# Patient Record
Sex: Female | Born: 1970 | Race: Black or African American | Hispanic: No | Marital: Married | Smoking: Never smoker
Health system: Southern US, Community
[De-identification: ages and names within clinical notes are randomized; demographics above are authoritative.]

## PROBLEM LIST (undated history)

## (undated) DIAGNOSIS — K219 Gastro-esophageal reflux disease without esophagitis: Secondary | ICD-10-CM

## (undated) DIAGNOSIS — O24419 Gestational diabetes mellitus in pregnancy, unspecified control: Secondary | ICD-10-CM

## (undated) DIAGNOSIS — K589 Irritable bowel syndrome without diarrhea: Secondary | ICD-10-CM

## (undated) DIAGNOSIS — E669 Obesity, unspecified: Secondary | ICD-10-CM

## (undated) DIAGNOSIS — I10 Essential (primary) hypertension: Secondary | ICD-10-CM

## (undated) DIAGNOSIS — M3500 Sicca syndrome, unspecified: Secondary | ICD-10-CM

## (undated) DIAGNOSIS — J302 Other seasonal allergic rhinitis: Secondary | ICD-10-CM

## (undated) HISTORY — DX: Irritable bowel syndrome, unspecified: K58.9

## (undated) HISTORY — DX: Other seasonal allergic rhinitis: J30.2

## (undated) HISTORY — DX: Gestational diabetes mellitus in pregnancy, unspecified control: O24.419

## (undated) HISTORY — DX: Gastro-esophageal reflux disease without esophagitis: K21.9

## (undated) HISTORY — DX: Obesity, unspecified: E66.9

## (undated) HISTORY — DX: Essential (primary) hypertension: I10

## (undated) HISTORY — DX: Sjogren syndrome, unspecified: M35.00

---

## 1998-12-02 HISTORY — PX: CHOLECYSTECTOMY: SHX55

## 1998-12-02 HISTORY — PX: OTHER SURGICAL HISTORY: SHX169

## 2006-01-20 ENCOUNTER — Encounter: Admission: RE | Admit: 2006-01-20 | Discharge: 2006-01-20 | Payer: Self-pay | Admitting: Obstetrics and Gynecology

## 2006-03-18 ENCOUNTER — Inpatient Hospital Stay (HOSPITAL_COMMUNITY): Admission: AD | Admit: 2006-03-18 | Discharge: 2006-03-18 | Payer: Self-pay | Admitting: Obstetrics and Gynecology

## 2006-03-21 ENCOUNTER — Inpatient Hospital Stay (HOSPITAL_COMMUNITY): Admission: AD | Admit: 2006-03-21 | Discharge: 2006-03-21 | Payer: Self-pay | Admitting: Obstetrics and Gynecology

## 2006-03-24 ENCOUNTER — Encounter (INDEPENDENT_AMBULATORY_CARE_PROVIDER_SITE_OTHER): Payer: Self-pay | Admitting: Specialist

## 2006-03-24 ENCOUNTER — Ambulatory Visit: Payer: Self-pay | Admitting: Gynecology

## 2006-03-24 ENCOUNTER — Inpatient Hospital Stay (HOSPITAL_COMMUNITY): Admission: AD | Admit: 2006-03-24 | Discharge: 2006-03-27 | Payer: Self-pay | Admitting: *Deleted

## 2007-03-25 ENCOUNTER — Encounter: Admission: RE | Admit: 2007-03-25 | Discharge: 2007-03-25 | Payer: Self-pay | Admitting: Obstetrics and Gynecology

## 2009-06-22 ENCOUNTER — Ambulatory Visit: Payer: Self-pay | Admitting: Family Medicine

## 2009-06-22 DIAGNOSIS — J301 Allergic rhinitis due to pollen: Secondary | ICD-10-CM | POA: Insufficient documentation

## 2009-06-22 DIAGNOSIS — K589 Irritable bowel syndrome without diarrhea: Secondary | ICD-10-CM | POA: Insufficient documentation

## 2009-06-22 DIAGNOSIS — N3000 Acute cystitis without hematuria: Secondary | ICD-10-CM | POA: Insufficient documentation

## 2009-06-22 DIAGNOSIS — R079 Chest pain, unspecified: Secondary | ICD-10-CM | POA: Insufficient documentation

## 2009-06-22 LAB — CONVERTED CEMR LAB
Nitrite: NEGATIVE
Specific Gravity, Urine: 1.015
Urobilinogen, UA: 0.2
pH: 7

## 2009-06-23 ENCOUNTER — Encounter: Payer: Self-pay | Admitting: Family Medicine

## 2009-06-23 LAB — CONVERTED CEMR LAB
Bilirubin Urine: NEGATIVE
Hemoglobin, Urine: NEGATIVE
Protein, ur: NEGATIVE mg/dL
Urine Glucose: NEGATIVE mg/dL

## 2009-07-03 ENCOUNTER — Telehealth: Payer: Self-pay | Admitting: Family Medicine

## 2009-08-01 ENCOUNTER — Encounter (INDEPENDENT_AMBULATORY_CARE_PROVIDER_SITE_OTHER): Payer: Self-pay | Admitting: *Deleted

## 2009-08-01 ENCOUNTER — Ambulatory Visit: Payer: Self-pay | Admitting: Cardiology

## 2009-08-01 ENCOUNTER — Encounter: Payer: Self-pay | Admitting: Adult Health

## 2009-08-10 ENCOUNTER — Encounter: Payer: Self-pay | Admitting: Family Medicine

## 2009-08-10 LAB — CONVERTED CEMR LAB
Glucose, Urine, Semiquant: NEGATIVE
Protein, U semiquant: NEGATIVE
WBC Urine, dipstick: NEGATIVE

## 2009-08-11 ENCOUNTER — Encounter: Payer: Self-pay | Admitting: Family Medicine

## 2009-08-18 ENCOUNTER — Ambulatory Visit (HOSPITAL_COMMUNITY): Admission: RE | Admit: 2009-08-18 | Discharge: 2009-08-18 | Payer: Self-pay | Admitting: Cardiology

## 2009-08-18 ENCOUNTER — Ambulatory Visit: Payer: Self-pay | Admitting: Cardiology

## 2009-08-18 ENCOUNTER — Encounter: Payer: Self-pay | Admitting: Cardiology

## 2009-08-21 ENCOUNTER — Encounter (INDEPENDENT_AMBULATORY_CARE_PROVIDER_SITE_OTHER): Payer: Self-pay | Admitting: *Deleted

## 2010-01-03 ENCOUNTER — Ambulatory Visit: Payer: Self-pay | Admitting: Family Medicine

## 2010-01-03 DIAGNOSIS — R5383 Other fatigue: Secondary | ICD-10-CM | POA: Insufficient documentation

## 2010-01-03 DIAGNOSIS — J019 Acute sinusitis, unspecified: Secondary | ICD-10-CM

## 2010-04-27 ENCOUNTER — Encounter: Admission: RE | Admit: 2010-04-27 | Discharge: 2010-04-27 | Payer: Self-pay | Admitting: Obstetrics and Gynecology

## 2010-10-11 ENCOUNTER — Ambulatory Visit (HOSPITAL_COMMUNITY): Admission: RE | Admit: 2010-10-11 | Discharge: 2010-10-11 | Payer: Self-pay | Admitting: Urology

## 2010-12-06 ENCOUNTER — Ambulatory Visit: Admit: 2010-12-06 | Payer: Self-pay | Admitting: Internal Medicine

## 2011-01-01 NOTE — Assessment & Plan Note (Signed)
Summary: OV   Vital Signs:  Patient profile:   40 year old female Menstrual status:  regular Height:      60 inches Weight:      172 pounds BMI:     33.71 O2 Sat:      98 % Pulse rate:   76 / minute Pulse rhythm:   regular Resp:     16 per minute BP sitting:   110 / 70 Cuff size:   large  Vitals Entered By: Everitt Amber (January 03, 2010 3:17 PM)  Nutrition Counseling: Patient's BMI is greater than 25 and therefore counseled on weight management options. CC: sinus, headache, mucus is yellowish greenish with some blood off and on, going on for almost a month. Sometimes the mucus is clear but then it gets yellowish again   Primary Care Provider:  Dr.Margaret Lodema Hong  CC:  sinus, headache, mucus is yellowish greenish with some blood off and on, and going on for almost a month. Sometimes the mucus is clear but then it gets yellowish again.  History of Present Illness: one moonth h/o sinus pressure with post nsal drainage which is bloody at times.she has also had intermittent chills but no documented fever. She denies productive cough. The pt continues to exercise on a regular basis, and is somewhat surprised and disappointed that she has not lost weight. She has no interest in eith er a nutrition consult or a diet plan. Reports  thatshe has otherwise been doing well Denies recent fever   Denies chest congestion, or cough productive of sputum. Denies chest pain, palpitations, PND, orthopnea or leg swelling. Denies abdominal pain, nausea, vomitting, diarrhea or constipation. Denies change in bowel movements or bloody stool. Denies dysuria , frequency, or hesitancy. Denies  joint pain, swelling, or reduced mobility. Denies headaches, vertigo, seizures. Denies depression, anxiety or insomnia. Denies  rash, lesions, or itch.     Current Medications (verified): 1)  None  Allergies (verified): No Known Drug Allergies  Review of Systems      See HPI ENT:  Complains of nasal  congestion, postnasal drainage, and sinus pressure; 1 month h/o pressure over all sinuses, intermittent green drainage and bklloody nasal d/c, no feevr or chills.Marland Kitchen GI:  Complains of abdominal pain and constipation; IBS constipation predominant dx ove 5 yrs ago. GU:  Complains of incontinence; stress incontinence with excessive sneezing. Endo:  Denies cold intolerance, excessive hunger, excessive thirst, excessive urination, heat intolerance, polyuria, and weight change. Heme:  Denies abnormal bruising and bleeding. Allergy:  Complains of seasonal allergies.  Physical Exam  General:  Well-developed,well-nourished,in no acute distress; alert,appropriate and cooperative throughout examination HEENT: No facial asymmetry,  EOMI, positive  sinus tenderness, TM's Clear, oropharynx  pink and moist.   Chest: Clear to auscultation bilaterally.  CVS: S1, S2, No murmurs, No S3.   Abd: Soft, Nontender.  MS: Adequate ROM spine, hips, shoulders and knees.  Ext: No edema.   CNS: CN 2-12 intact, power tone and sensation normal throughout.   Skin: Intact, no visible lesions or rashes.  Psych: Good eye contact, normal affect.  Memory intact, not anxious or depressed appearing.    Impression & Recommendations:  Problem # 1:  ACUTE SINUSITIS, UNSPECIFIED (ICD-461.9) Assessment Comment Only  Her updated medication list for this problem includes:    Septra Ds 800-160 Mg Tabs (Sulfamethoxazole-trimethoprim) .Marland Kitchen... Take 1 tablet by mouth two times a day  Problem # 2:  IBS (ICD-564.1) Assessment: Unchanged  Problem # 3:  OBESITY, UNSPECIFIED (  ICD-278.00) Assessment: Unchanged  Ht: 60 (01/03/2010)   Wt: 172 (01/03/2010)   BMI: 33.71 (01/03/2010)  Complete Medication List: 1)  Septra Ds 800-160 Mg Tabs (Sulfamethoxazole-trimethoprim) .... Take 1 tablet by mouth two times a day 2)  Fluconazole 150 Mg Tabs (Fluconazole) .... Take 1 tablet by mouth once a day as needed  Other Orders: T-Basic Metabolic  Panel ([id number]) T-Lipid Profile [id number]) T-CBC w/Diff [phone]) T-TSH [phone])  Patient Instructions: 1)  F/UI as before. 2)  PLS get your mamo on your 39th b/day. 3)  You are being treated for acute sinustis. 4)  BMP prior to visit, ICD-9: 5)  Lipid Panel prior to visit, ICD-9:  fasting labs  past due pls do asap 6)  TSH prior to visit, ICD-9: 7)  CBC w/ Diff prior to visit, ICD-9: 8)  It is important that you exercise regularly at least 20 minutes 5 times a week. If you develop chest pain, have severe difficulty breathing, or feel very tired , stop exercising immediately and seek medical attention. 9)  You need to lose weight. Consider a lower calorie diet and regular exercise.  Prescriptions: FLUCONAZOLE 150 MG TABS (FLUCONAZOLE) Take 1 tablet by mouth once a day as needed  #3 x 0   Entered and Authorized by:   Syliva Overman MD   Signed by:   Syliva Overman MD on 01/03/2010   Method used:   Electronically to        [facility]* (retail)       [address]       [address] Shipping/mailing       [place], [place]  [address]       Ph: [phone]       Fax: [phone]   RxID:   [id number] SEPTRA DS 800-160 MG TABS (SULFAMETHOXAZOLE-TRIMETHOPRIM) Take 1 tablet by mouth two times a day  #42 x 0   Entered and Authorized by:   Syliva Overman MD   Signed by:   Syliva Overman MD on 01/03/2010   Method used:   Electronically to        [facility]* (retail)       [address]       [address]/mail[address]       [place], [place]  [address]       Ph: [phone]       Fax: [phone]   RxID:   [id number]

## 2011-04-18 ENCOUNTER — Other Ambulatory Visit: Payer: Self-pay | Admitting: Obstetrics and Gynecology

## 2011-04-18 DIAGNOSIS — Z1231 Encounter for screening mammogram for malignant neoplasm of breast: Secondary | ICD-10-CM

## 2011-04-19 NOTE — Op Note (Signed)
Brittney [name], Brittney [name]                ACCOUNT NO.:  [id number]   MEDICAL RECORD NO.:  [id number]          PATIENT TYPE:  INP   LOCATION:  9103                          FACILITY:  [facility]   PHYSICIAN:  Maxie Better, M.D.DATE OF BIRTH:  1971-02-15   DATE OF PROCEDURE:  03/24/2006  DATE OF DISCHARGE:                                 OPERATIVE REPORT   PREOPERATIVE DIAGNOSIS:  Nonreassuring fetal tracing, class A1 gestational  diabetes.  Desires sterilization.   PROCEDURE:  Primary cesarean section Sharl Ma hysterotomy, modified Pomeroy  tubal ligation.   POSTOPERATIVE DIAGNOSIS:  Nonreassuring fetal tracing, class A1 gestational  diabetes, desires sterilization.   ANESTHESIA:  Epidural.   SURGEON:  Maxie Better, M.D.   PROCEDURE:  Under adequate epidural anesthesia, the patient was placed in a  supine position with a left lateral tilt.  She was sterilely prepped and  draped in the usual fashion.  Indwelling Foley catheter was already in  place.  Internal scalp electrode was removed.  A Pfannenstiel skin incision  was made, carried down through the rectus fascia.  The rectus fascia was  incised in the midline, extending bilaterally.  The rectus fascia was then  bluntly and sharply dissected off the rectus muscle in a superior and  inferior fashion.  The rectus muscle was split in the midline.  The parietal  peritoneum was entered bluntly and extended.  The vesicouterine peritoneum  was opened transversely.  The bladder displaced inferiorly using a bladder  retractor.  A curvilinear low transverse uterine incision was then made and  extended bilaterally using bandage scissors.  Subsequent delivery of a live  female, left occiput anterior position, was accomplished.  The baby was bulb-  suctioned on the abdomen.  The cord was clamped, cut.  The baby was  transferred to the waiting pediatrician with Apgars of 8 and 9 at one and  five minutes.  The placenta, which was anterior, was  manually removed.  Cord  pH was obtained, 7.23.  Apgars of 8 and 9 were assigned at one and five  minutes.  Uterine incision was closed in two layers, the first layer with 0  Monocryl running locked stitch, the second layer was imbricated using 0  Monocryl suture.  Normal tubes and ovaries were noted bilaterally.   The mid portion of both fallopian tubes was grasped with a Babcock.  The  underlying mesosalpinx was opened using cautery.  The proximal and distal  portion of the tube was tied with a 0 chromic x2, proximally and distally,  and the intervening segments of both fallopian tubes were then removed.  The  abdomen was copiously irrigated and suctioned of debris.  The parietal  peritoneum and the vesicouterine peritoneum were now closed.  The rectus  fascia was closed with 0 Vicryl x2.  The subcutaneous areas were irrigated,  suctioned, small bleeders cauterized.  The subcutaneous areas were  approximated using 2-0 plain interrupted sutures.  The skin was approximated  using Ethicon staples.  The specimen was placenta.  A portion of the right  and left fallopian tubes were sent to pathology.  Estimated blood loss was  500 cc.  Intraoperative fluids were 900 cc.  Urine output was 150 cc of  concentrated urine.  Sponge and instrument counts x2 were correct.   COMPLICATIONS:  None.   The patient tolerated the procedure well and was transferred to recovery in  stable condition.      Maxie Better, M.D.  Electronically Signed     [place]/MEDQ  D:  03/24/2006  T:  03/25/2006  Job:  [id number]

## 2011-04-19 NOTE — Discharge Summary (Signed)
Brittney [name], Brittney [name]                ACCOUNT NO.:  [id number]   MEDICAL RECORD NO.:  [id number]          PATIENT TYPE:  INP   LOCATION:  9103                          FACILITY:  [facility]   PHYSICIAN:  Maxie Better, M.D.DATE OF BIRTH:  [birth year]   DATE OF ADMISSION:  03/24/2006  DATE OF DISCHARGE:  03/27/2006                                 DISCHARGE SUMMARY   ADMISSION DIAGNOSES:  1.  Term gestation.  2.  Class A1 gestational diabetes.  3.  Desires sterilization.  4.  Iron-deficiency anemia.  5.  Escherichia coli urinary tract infection.   DISCHARGE DIAGNOSES:  1.  Term gestation, delivered.  2.  Class A1 gestational diabetes.  3.  Desires sterilization.  4.  Nonreassuring fetal tracing.  5.  Iron-deficiency anemia.  6.  Escherichia coli urinary tract infection.   PROCEDURE:  Primary cesarean section, modified Pomeroy tubal ligation.   HISTORY OF PRESENT ILLNESS:  A 40 year old, G3, P1-0-1-1, female at 39+  weeks with class A1 gestational diabetes admitted for induction of labor due  to her diabetes. Group B strep was negative.   HOSPITAL COURSE:  The patient was admitted to the [facility], she was  started on low-dose pitocin. She was also continued on her antibiotics for  her urinary tract infection diagnosed prior to being admitted to the  hospital. The patient progressed during labor.  She was given epidural for  labor of pain management. PIH labs were obtained during labor due to  elevation of blood pressure and those were normal. The patient progressed to  full dilatation, 0+ station, and again having some deep variable  decelerations down to the 80s. An internal scalp electrode was placed.  Scalp stimulation was done.  Right occiput presentation on clinical exam  with a -1 station. The patient's baby continued to have fetal heart rate  deceleration with slow return to baseline, with no evidence of imminent  delivery and inability to perform an assisted vaginal  delivery. Decision was  made to proceed with a primary cesarean section. The patient was taken to  the operating room where she underwent a primary cesarean section. The  procedure resulted in the delivery of a live female, Apgars of 8 and 9. Cord  pH was obtained, which was 7.23. Postoperatively, the patient was continued  on IV antibiotics to cover her known urinary tract infection and she was  placed on a regular diet. Her CBC on postoperative day #1 showed a  hemoglobin of 10.8, hematocrit 31.8, white count of 12.0. Final pathology on  her placenta showed mild acute chorioamnionitis and evidence of transection  of both portions of the fallopian tubes. By postoperative day three the  patient was afebrile, incision had no evidence of infection. She was doing  well and was deemed well enough to be discharged home.   DISPOSITION:  Home.   CONDITION ON DISCHARGE:  Stable.   DISCHARGE MEDICATIONS:  1.  Motrin 800 mg one p.o. q.6-8h. p.r.n. pain.  2.  Keflex 500 mg one p.o. q.6h.  3.  Prenatal vitamins one p.o. daily.  4.  Tylox  one to two tablets q.3-4h. p.r.n. pain.   DISCHARGE INSTRUCTIONS:  Per the postpartum booklet given. Follow up at  [facility] in six weeks.      Maxie Better, M.D.  Electronically Signed     [place]/MEDQ  D:  05/05/2006  T:  05/06/2006  Job:  [id number]

## 2011-05-09 ENCOUNTER — Ambulatory Visit: Payer: Self-pay

## 2011-06-06 ENCOUNTER — Ambulatory Visit (HOSPITAL_COMMUNITY)
Admission: RE | Admit: 2011-06-06 | Discharge: 2011-06-06 | Disposition: A | Discharge status: Home / Self Care | Payer: 59 | Source: Ambulatory Visit | Attending: Obstetrics and Gynecology | Admitting: Obstetrics and Gynecology

## 2011-06-06 DIAGNOSIS — Z1231 Encounter for screening mammogram for malignant neoplasm of breast: Secondary | ICD-10-CM | POA: Insufficient documentation

## 2011-06-11 ENCOUNTER — Encounter: Payer: Self-pay | Admitting: Adult Health

## 2012-01-23 ENCOUNTER — Other Ambulatory Visit (HOSPITAL_COMMUNITY): Payer: Self-pay | Admitting: Obstetrics and Gynecology

## 2012-01-23 DIAGNOSIS — N644 Mastodynia: Secondary | ICD-10-CM

## 2012-02-03 ENCOUNTER — Other Ambulatory Visit: Payer: Self-pay | Admitting: Obstetrics and Gynecology

## 2012-02-03 ENCOUNTER — Other Ambulatory Visit (HOSPITAL_COMMUNITY): Payer: Self-pay | Admitting: Obstetrics and Gynecology

## 2012-02-04 ENCOUNTER — Ambulatory Visit (HOSPITAL_COMMUNITY)
Admission: RE | Admit: 2012-02-04 | Discharge: 2012-02-04 | Disposition: A | Discharge status: Home / Self Care | Payer: 59 | Source: Ambulatory Visit | Attending: Obstetrics and Gynecology | Admitting: Obstetrics and Gynecology

## 2012-02-04 ENCOUNTER — Other Ambulatory Visit (HOSPITAL_COMMUNITY): Payer: Self-pay | Admitting: Obstetrics and Gynecology

## 2012-02-04 ENCOUNTER — Other Ambulatory Visit: Payer: 59

## 2012-02-04 DIAGNOSIS — R1032 Left lower quadrant pain: Secondary | ICD-10-CM | POA: Insufficient documentation

## 2012-02-04 DIAGNOSIS — R1904 Left lower quadrant abdominal swelling, mass and lump: Secondary | ICD-10-CM | POA: Insufficient documentation

## 2012-02-04 DIAGNOSIS — N63 Unspecified lump in unspecified breast: Secondary | ICD-10-CM | POA: Insufficient documentation

## 2012-02-05 ENCOUNTER — Other Ambulatory Visit (HOSPITAL_COMMUNITY): Payer: Self-pay | Admitting: Obstetrics and Gynecology

## 2012-02-05 ENCOUNTER — Ambulatory Visit (HOSPITAL_COMMUNITY)
Admission: RE | Admit: 2012-02-05 | Discharge: 2012-02-05 | Disposition: A | Discharge status: Home / Self Care | Payer: 59 | Source: Ambulatory Visit | Attending: Obstetrics and Gynecology | Admitting: Obstetrics and Gynecology

## 2012-02-05 DIAGNOSIS — N644 Mastodynia: Secondary | ICD-10-CM

## 2012-02-05 DIAGNOSIS — R52 Pain, unspecified: Secondary | ICD-10-CM

## 2012-02-05 DIAGNOSIS — R1909 Other intra-abdominal and pelvic swelling, mass and lump: Secondary | ICD-10-CM

## 2012-02-06 ENCOUNTER — Ambulatory Visit (HOSPITAL_COMMUNITY)
Admission: RE | Admit: 2012-02-06 | Discharge: 2012-02-06 | Disposition: A | Discharge status: Home / Self Care | Payer: 59 | Source: Ambulatory Visit | Attending: Obstetrics and Gynecology | Admitting: Obstetrics and Gynecology

## 2012-02-06 DIAGNOSIS — R52 Pain, unspecified: Secondary | ICD-10-CM

## 2012-02-06 DIAGNOSIS — R1909 Other intra-abdominal and pelvic swelling, mass and lump: Secondary | ICD-10-CM

## 2012-02-06 DIAGNOSIS — R599 Enlarged lymph nodes, unspecified: Secondary | ICD-10-CM | POA: Insufficient documentation

## 2012-02-06 DIAGNOSIS — R1904 Left lower quadrant abdominal swelling, mass and lump: Secondary | ICD-10-CM | POA: Insufficient documentation

## 2012-06-10 ENCOUNTER — Other Ambulatory Visit (HOSPITAL_COMMUNITY): Payer: Self-pay | Admitting: Obstetrics and Gynecology

## 2012-06-10 DIAGNOSIS — Z09 Encounter for follow-up examination after completed treatment for conditions other than malignant neoplasm: Secondary | ICD-10-CM

## 2012-06-16 ENCOUNTER — Ambulatory Visit (HOSPITAL_COMMUNITY)
Admission: RE | Admit: 2012-06-16 | Discharge: 2012-06-16 | Disposition: A | Discharge status: Home / Self Care | Payer: 59 | Source: Ambulatory Visit | Attending: Obstetrics and Gynecology | Admitting: Obstetrics and Gynecology

## 2012-06-16 DIAGNOSIS — Z09 Encounter for follow-up examination after completed treatment for conditions other than malignant neoplasm: Secondary | ICD-10-CM

## 2012-06-16 DIAGNOSIS — Z1231 Encounter for screening mammogram for malignant neoplasm of breast: Secondary | ICD-10-CM | POA: Insufficient documentation

## 2013-02-06 ENCOUNTER — Encounter: Payer: Self-pay | Admitting: *Deleted

## 2013-02-09 ENCOUNTER — Ambulatory Visit (INDEPENDENT_AMBULATORY_CARE_PROVIDER_SITE_OTHER): Payer: 59 | Admitting: Internal Medicine

## 2013-02-09 ENCOUNTER — Encounter (INDEPENDENT_AMBULATORY_CARE_PROVIDER_SITE_OTHER): Payer: Self-pay | Admitting: Internal Medicine

## 2013-02-09 VITALS — BP 118/74 | HR 60 | Ht 59.0 in | Wt 174.9 lb

## 2013-02-09 DIAGNOSIS — K5900 Constipation, unspecified: Secondary | ICD-10-CM | POA: Insufficient documentation

## 2013-02-09 DIAGNOSIS — R195 Other fecal abnormalities: Secondary | ICD-10-CM | POA: Insufficient documentation

## 2013-02-09 DIAGNOSIS — R198 Other specified symptoms and signs involving the digestive system and abdomen: Secondary | ICD-10-CM | POA: Insufficient documentation

## 2013-02-09 NOTE — Progress Notes (Addendum)
Subjective:     Patient ID: Brittney [name], female   DOB: 1971/07/22, 42 y.o.   MRN: [id number]  HPI Referred to our office by Dr. Lilyan Punt for constipation. She has been constipated for about 2-3 yrs. She says it is to the point now if she doesn't take something she cannot have a BM. She has seen mucous. No blood however. She is having to strain on occasion.  She has to take Smooth Move Tea to have a BM. She tells me her stool are smaller in caliber x 1 month. No melena or bright red rectal bleeding. Some lower abdominal discomfort which she rates at a 1. No rectal pain. Stools are light brown in color. Appetite is good. No weight loss.  Father had throat cancer with mets to his colon.    02/04/2013 H and H 13.0 and 37.6, MCV 88.5 Total bili 0.5, ALP 39, AST 17, ALT 11  Review of Systems see hpi Current Outpatient Prescriptions  Medication Sig Dispense Refill  . Biotin 1000 MCG tablet Take 1,000 mcg by mouth 3 (three) times daily.       No current facility-administered medications for this visit.   Past Medical History  Diagnosis Date  . Obesity   . Chest pain     intermittent x 1 yr  . Seasonal allergies     yr roud; was immunothrapy in childhood, does not need any more at this time  . IBS (irritable bowel syndrome)     dx in 2005 by primary MD  . Diabetic mellitus, gestational   . Hypertension     during pregnancy. Not treated for it now.  . IBS (irritable bowel syndrome)   . Acid reflux    Past Surgical History  Procedure Laterality Date  . Cesarean section w/btl  2007  . Cholectystectomy  2000  . Cholecystectomy  2000   Allergies  Allergen Reactions  . Miralax (Polyethylene Glycol) Swelling    Tongue swelling        Objective:   Physical Exam  Filed Vitals:   02/09/13 1544  BP: 118/74  Pulse: 60  Height: 4\' 11"  (1.499 m)  Weight: 174 lb 14.4 oz (79.334 kg)   Alert and oriented. Skin warm and dry. Oral mucosa is moist.   . Sclera anicteric, conjunctivae  is pink. Thyroid not enlarged. No cervical lymphadenopathy. Lungs clear. Heart regular rate and rhythm.  Abdomen is soft. Bowel sounds are positive. No hepatomegaly. No abdominal masses felt. No tenderness.  No edema to lower extremities.  No stool, guaiac negative     Assessment:    Constipation. Change in stool caliber. Colonic neoplasm needs to be ruled out    Plan:    Colonoscopy with Dr Karilyn Cota.

## 2013-02-09 NOTE — Patient Instructions (Addendum)
Colonoscopy with Dr. Rehman 

## 2013-02-10 ENCOUNTER — Telehealth (INDEPENDENT_AMBULATORY_CARE_PROVIDER_SITE_OTHER): Payer: Self-pay | Admitting: *Deleted

## 2013-02-10 ENCOUNTER — Other Ambulatory Visit (INDEPENDENT_AMBULATORY_CARE_PROVIDER_SITE_OTHER): Payer: Self-pay | Admitting: *Deleted

## 2013-02-10 DIAGNOSIS — Z1211 Encounter for screening for malignant neoplasm of colon: Secondary | ICD-10-CM

## 2013-02-10 DIAGNOSIS — K5900 Constipation, unspecified: Secondary | ICD-10-CM

## 2013-02-10 DIAGNOSIS — R195 Other fecal abnormalities: Secondary | ICD-10-CM

## 2013-02-10 MED ORDER — SOD PHOS MONO-SOD PHOS DIBASIC 1.102-0.398 G PO TABS: 1.0000 | ORAL_TABLET | Freq: Once | ORAL | Status: DC

## 2013-02-10 NOTE — Telephone Encounter (Addendum)
Patient needs osmo pill prep 

## 2013-02-15 ENCOUNTER — Encounter (HOSPITAL_COMMUNITY): Payer: Self-pay | Admitting: Pharmacy Technician

## 2013-02-19 ENCOUNTER — Ambulatory Visit (HOSPITAL_COMMUNITY)
Admission: RE | Admit: 2013-02-19 | Discharge: 2013-02-19 | Disposition: A | Discharge status: Home / Self Care | Payer: 59 | Source: Ambulatory Visit | Attending: Internal Medicine | Admitting: Internal Medicine

## 2013-02-19 ENCOUNTER — Encounter (HOSPITAL_COMMUNITY): Payer: Self-pay | Admitting: *Deleted

## 2013-02-19 ENCOUNTER — Encounter (HOSPITAL_COMMUNITY)
Admission: RE | Disposition: A | Discharge status: Home / Self Care | Payer: Self-pay | Source: Ambulatory Visit | Attending: Internal Medicine

## 2013-02-19 DIAGNOSIS — E119 Type 2 diabetes mellitus without complications: Secondary | ICD-10-CM | POA: Insufficient documentation

## 2013-02-19 DIAGNOSIS — K644 Residual hemorrhoidal skin tags: Secondary | ICD-10-CM | POA: Insufficient documentation

## 2013-02-19 DIAGNOSIS — K6389 Other specified diseases of intestine: Secondary | ICD-10-CM

## 2013-02-19 DIAGNOSIS — K5909 Other constipation: Secondary | ICD-10-CM | POA: Insufficient documentation

## 2013-02-19 DIAGNOSIS — Z800 Family history of malignant neoplasm of digestive organs: Secondary | ICD-10-CM | POA: Insufficient documentation

## 2013-02-19 DIAGNOSIS — R195 Other fecal abnormalities: Secondary | ICD-10-CM

## 2013-02-19 DIAGNOSIS — I10 Essential (primary) hypertension: Secondary | ICD-10-CM | POA: Insufficient documentation

## 2013-02-19 DIAGNOSIS — R198 Other specified symptoms and signs involving the digestive system and abdomen: Secondary | ICD-10-CM

## 2013-02-19 DIAGNOSIS — K5900 Constipation, unspecified: Secondary | ICD-10-CM

## 2013-02-19 HISTORY — PX: COLONOSCOPY: SHX5424

## 2013-02-19 SURGERY — COLONOSCOPY
Anesthesia: Moderate Sedation

## 2013-02-19 MED ORDER — MIDAZOLAM HCL 5 MG/5ML IJ SOLN
INTRAMUSCULAR | Status: DC | PRN
  Administered 2013-02-19 (×2): 2 mg via INTRAVENOUS

## 2013-02-19 MED ORDER — MIDAZOLAM HCL 5 MG/5ML IJ SOLN
INTRAMUSCULAR | Status: AC
  Filled 2013-02-19: qty 10

## 2013-02-19 MED ORDER — MEPERIDINE HCL 50 MG/ML IJ SOLN
INTRAMUSCULAR | Status: DC | PRN
  Administered 2013-02-19 (×2): 25 mg via INTRAVENOUS

## 2013-02-19 MED ORDER — STERILE WATER FOR IRRIGATION IR SOLN
Status: DC | PRN
  Administered 2013-02-19: 09:00:00

## 2013-02-19 MED ORDER — MEPERIDINE HCL 50 MG/ML IJ SOLN
INTRAMUSCULAR | Status: AC
  Filled 2013-02-19: qty 1

## 2013-02-19 MED ORDER — SODIUM CHLORIDE 0.9 % IV SOLN
INTRAVENOUS | Status: DC
  Administered 2013-02-19: 1000 mL via INTRAVENOUS

## 2013-02-19 NOTE — H&P (Signed)
Brittney [name] is an 42 y.o. female.   Chief Complaint: Patient is here for colonoscopy. HPI: Patient is 42 year old female who presents with progressive constipation and change in caliber of her stools. She denies abdominal pain rectal bleeding. She has good appetite. She has not lost any weight recently. She try polyethylene glycol the tongue swelling. She is known OTC laxatives which seems to help. Family history significant for colon carcinoma in father at age 47. He had large cecal mass and multiple colonic adenomas removed. He also had laryngectomy several years earlier for laryngeal carcinoma.  Past Medical History  Diagnosis Date  . Obesity   . Chest pain     intermittent x 1 yr  . Seasonal allergies     yr roud; was immunothrapy in childhood, does not need any more at this time  . IBS (irritable bowel syndrome)     dx in 2005 by primary MD  . Diabetic mellitus, gestational   . IBS (irritable bowel syndrome)   . Acid reflux   . Hypertension     during pregnancy. Not treated for it now.    Past Surgical History  Procedure Laterality Date  . Cesarean section w/btl  2007  . Cholectystectomy  2000  . Cholecystectomy  2000    History reviewed. No pertinent family history. Social History:  reports that she has never smoked. She does not have any smokeless tobacco history on file. She reports that  drinks alcohol. She reports that she does not use illicit drugs.  Allergies:  Allergies  Allergen Reactions  . Miralax (Polyethylene Glycol) Swelling    Tongue swelling    Medications Prior to Admission  Medication Sig Dispense Refill  . sodium phosphates (OSMOPREP) 1.102-0.398 G TABS Take 1 tablet by mouth once.  32 tablet  0    No results found for this or any previous visit (from the past 48 hour(s)). No results found.  ROS  Blood pressure 132/75, pulse 69, temperature 98.3 F (36.8 C), temperature source Oral, resp. rate 18, weight 169 lb (76.658 kg), last menstrual  period 01/27/2013, SpO2 99.00%. Physical Exam  Constitutional: She appears well-developed and well-nourished.  HENT:  Mouth/Throat: Oropharynx is clear and moist.  Eyes: Conjunctivae are normal.  Neck: No thyromegaly present.  Cardiovascular: Normal rate, regular rhythm and normal heart sounds.   No murmur heard. Respiratory: Effort normal and breath sounds normal.  GI: Soft. She exhibits no distension and no mass. There is no tenderness.  Musculoskeletal: She exhibits no edema.  Lymphadenopathy:    She has no cervical adenopathy.  Neurological: She is alert.  Skin: Skin is warm and dry.     Assessment/Plan Constipation with change in caliber of stools. Family history of colon carcinoma In a first-degree relative at late onset(age 64; father). Diagnostic colonoscopy.  Brittney [name] 02/19/2013, 8:25 AM

## 2013-02-19 NOTE — Op Note (Addendum)
COLONOSCOPY PROCEDURE REPORT  PATIENT:  Brittney [name]  MR#:  [id number] Birthdate:  [birth year], 42 y.o., female Endoscopist:  Dr. Malissa Hippo, MD Referred By:  Dr. Lilyan Punt, MD Procedure Date: 02/19/2013  Procedure:   Colonoscopy  Indications:  Patient is 42 year old African female with a worsening constipation and change in caliber of stool. Family history is significant for history of colonic adenomas and of father who eventually developed cancer at age 78 two years ago and is doing well following surgery.  Informed Consent:  The procedure and risks were reviewed with the patient and informed consent was obtained.  Medications:  Demerol 50 mg IV Versed 4 mg IV  Description of procedure:  After a digital rectal exam was performed, that colonoscope was advanced from the anus through the rectum and colon to the area of the cecum, ileocecal valve and appendiceal orifice. The cecum was deeply intubated. These structures were well-seen and photographed for the record. From the level of the cecum and ileocecal valve, the scope was slowly and cautiously withdrawn. The mucosal surfaces were carefully surveyed utilizing scope tip to flexion to facilitate fold flattening as needed. The scope was pulled down into the rectum where a thorough exam including retroflexion was performed. Terminal ileum was also examined.  Findings:   Prep excellent Normal mucosa of terminal ileum. Scattered erosions at distal sigmoid colon and rectosigmoid junction. Biopsy taken. Small hemorrhoids below the dentate line along with single anal papilla.    Therapeutic/Diagnostic Maneuvers Performed:  None  Complications:  None  Cecal Withdrawal Time:  13 minutes  Impression:  Normal terminal ileum. No evidence of colonic polyps or stricture. Scattered erosions at distal sigmoid colon and rectosigmoid junction possibly secondary to NSAID use. Biopsy taken. Small external hemorrhoids and single anal  papilla.  Recommendations:  Standard instructions given. High fiber diet. Fiber supplement 4 g by mouth daily Patient will continue herbal laxative for now. I will contact patient with biopsy results and further recommendations.  REHMAN,NAJEEB U  02/19/2013 8:59 AM  CC: Dr. Lilyan Punt, MD & Dr. Bonnetta Barry ref. provider found

## 2013-02-23 ENCOUNTER — Encounter (HOSPITAL_COMMUNITY): Payer: Self-pay | Admitting: Internal Medicine

## 2013-02-24 ENCOUNTER — Encounter (INDEPENDENT_AMBULATORY_CARE_PROVIDER_SITE_OTHER): Payer: Self-pay

## 2013-03-01 ENCOUNTER — Encounter (INDEPENDENT_AMBULATORY_CARE_PROVIDER_SITE_OTHER): Payer: Self-pay | Admitting: *Deleted

## 2013-06-10 ENCOUNTER — Encounter: Payer: Self-pay | Admitting: Family Medicine

## 2013-06-10 ENCOUNTER — Ambulatory Visit (INDEPENDENT_AMBULATORY_CARE_PROVIDER_SITE_OTHER): Payer: 59 | Admitting: Family Medicine

## 2013-06-10 VITALS — BP 130/80 | HR 70 | Wt 172.0 lb

## 2013-06-10 DIAGNOSIS — G5690 Unspecified mononeuropathy of unspecified upper limb: Secondary | ICD-10-CM

## 2013-06-10 DIAGNOSIS — G5692 Unspecified mononeuropathy of left upper limb: Secondary | ICD-10-CM

## 2013-06-10 DIAGNOSIS — M25549 Pain in joints of unspecified hand: Secondary | ICD-10-CM

## 2013-06-10 DIAGNOSIS — M25542 Pain in joints of left hand: Secondary | ICD-10-CM

## 2013-06-10 LAB — HEMOGLOBIN A1C
Hgb A1c MFr Bld: 5.3 % (ref <5.7)
Mean Plasma Glucose: 105 mg/dL (ref <117)

## 2013-06-10 NOTE — Progress Notes (Signed)
  Subjective:    Patient ID: Brittney [name], female    DOB: 1971/05/12, 42 y.o.   MRN: [id number]  HPIHaving weakness and cramping in both hands. Worse in the left. Started about 3 weeks ago. This patient's problem she relates his weakness in the hands is worse on the left side than the right side she denies any injury never had this problem before she does state that she wakes up at night with some numbness and tingling and at times she also relates that her hand seems to fall asleep. She states she does have a family history of arthritis and occasionally has stiffness in the joints on the left side PMH benign social does not smoke   Review of Systems See above.    Objective:   Physical Exam Lungs are clear heart is regular pulses normal blood pressure good positive Tinel's slight weakness noted with it intrinsic muscles of the hands although not severe Normal strength of the upper arm      Assessment & Plan:  Possible early carpal tunnel-to use braces at night. We'll check sedimentation rate and also look at glucose she had gestational diabetes we need to make sure this was not occurring. Certainly diabetes could cause some of her symptoms. I recommended nerve conduction testing the patient would like to hold on this for right now she will notify us later this summer when she is ready to have this done.

## 2013-06-11 ENCOUNTER — Encounter: Payer: Self-pay | Admitting: Family Medicine

## 2013-07-28 ENCOUNTER — Other Ambulatory Visit (HOSPITAL_COMMUNITY): Payer: Self-pay | Admitting: Obstetrics and Gynecology

## 2013-07-28 DIAGNOSIS — Z139 Encounter for screening, unspecified: Secondary | ICD-10-CM

## 2013-08-03 ENCOUNTER — Ambulatory Visit (HOSPITAL_COMMUNITY): Payer: 59

## 2013-08-05 ENCOUNTER — Ambulatory Visit (HOSPITAL_COMMUNITY)
Admission: RE | Admit: 2013-08-05 | Discharge: 2013-08-05 | Disposition: A | Discharge status: Home / Self Care | Payer: 59 | Source: Ambulatory Visit | Attending: Obstetrics and Gynecology | Admitting: Obstetrics and Gynecology

## 2013-08-05 DIAGNOSIS — Z139 Encounter for screening, unspecified: Secondary | ICD-10-CM

## 2013-08-05 DIAGNOSIS — Z1231 Encounter for screening mammogram for malignant neoplasm of breast: Secondary | ICD-10-CM | POA: Insufficient documentation

## 2014-02-03 ENCOUNTER — Ambulatory Visit (INDEPENDENT_AMBULATORY_CARE_PROVIDER_SITE_OTHER): Payer: 59 | Admitting: Family Medicine

## 2014-02-03 ENCOUNTER — Encounter: Payer: Self-pay | Admitting: Family Medicine

## 2014-02-03 VITALS — Ht 59.5 in | Wt 177.2 lb

## 2014-02-03 DIAGNOSIS — R202 Paresthesia of skin: Secondary | ICD-10-CM | POA: Insufficient documentation

## 2014-02-03 DIAGNOSIS — Z79899 Other long term (current) drug therapy: Secondary | ICD-10-CM

## 2014-02-03 DIAGNOSIS — R079 Chest pain, unspecified: Secondary | ICD-10-CM

## 2014-02-03 DIAGNOSIS — E782 Mixed hyperlipidemia: Secondary | ICD-10-CM

## 2014-02-03 DIAGNOSIS — R209 Unspecified disturbances of skin sensation: Secondary | ICD-10-CM

## 2014-02-03 DIAGNOSIS — R000 Tachycardia, unspecified: Secondary | ICD-10-CM

## 2014-02-03 LAB — BASIC METABOLIC PANEL
BUN: 7 mg/dL (ref 6–23)
CHLORIDE: 100 meq/L (ref 96–112)
CO2: 28 mEq/L (ref 19–32)
CREATININE: 0.62 mg/dL (ref 0.50–1.10)
Calcium: 9.5 mg/dL (ref 8.4–10.5)
Glucose, Bld: 92 mg/dL (ref 70–99)
POTASSIUM: 3.8 meq/L (ref 3.5–5.3)
Sodium: 138 mEq/L (ref 135–145)

## 2014-02-03 LAB — LIPID PANEL
CHOL/HDL RATIO: 3.2 ratio
Cholesterol: 185 mg/dL (ref 0–200)
HDL: 58 mg/dL (ref >39)
LDL CALC: 114 mg/dL — AB (ref 0–99)
Triglycerides: 65 mg/dL (ref <150)
VLDL: 13 mg/dL (ref 0–40)

## 2014-02-03 NOTE — Progress Notes (Signed)
   Subjective:    Patient ID: Brittney [name], female    DOB: 02-Jun-1971, 43 y.o.   MRN: [id number]  HPIHaving chest pain, heart racing/fluttering, left arm pain/numbness, left jaw pain, hands and feeling. Numbness has occurred before   Started 1 week ago. First- l arm numb lasted 10 minutes, no Ha, no dizzy, no weakness,with 2 sharp pains in jaw,mild pressure in chest- lasted 5 minutes, no dyspnea, no cough, No N V Heart at times flutters, sometimes at bedtime HR fast-will last up to 1 minute-generally at rest, no diaphoresis. Exercise- does to DVD,breathing does fine. Stress- past month 2 family passed, studying coder   Fam Hx- CHF, mom with CAD Feels stressed at times Review of Systems Denies headaches nausea please see above denies sweats chills denies muscle aches joint pains no hematuria no hematochezia    Objective:   Physical Exam Positive Tinel's lungs are clear hearts regular  extremities no edema skin warm dry ears normal throat normal       Assessment & Plan:  #1 palpitations-this last for anywhere from a few seconds to a minute in the bedtime region. Is not associated with activity. No diaphoresis or chest pain with it. She will try to get a count on the heart rate and inform us accordingly. If in the 120 in upper range may need consideration for cardiology referral  #2 chest pain-there is no sign of angina going on currently. This pain occurred at rest. She does exercise DVDs and does not have any problems with chest pressure tightness. She had echo done approximately 5 years ago which was normal in her cholesterol profile from approximately one year ago shows the risk of heart disease to be less than 1%. It is possible some of this could be stress related. If she has ongoing trouble she is to notify us. If necessary we can help get consultation with cardiology if things get worse  #3 bilateral hand numbness related to probable carpal tunnel wear splints at nighttime if he  gets worse recommend nerve conduction testing  Patient was instructed to followup if symptoms are not improving over the next couple weeks

## 2014-02-03 NOTE — Patient Instructions (Signed)
DASH Diet  The DASH diet stands for "Dietary Approaches to Stop Hypertension." It is a healthy eating plan that has been shown to reduce high blood pressure (hypertension) in as little as 14 days, while also possibly providing other significant health benefits. These other health benefits include reducing the risk of breast cancer after menopause and reducing the risk of type 2 diabetes, heart disease, colon cancer, and stroke. Health benefits also include weight loss and slowing kidney failure in patients with chronic kidney disease.   DIET GUIDELINES  · Limit salt (sodium). Your diet should contain less than 1500 mg of sodium daily.  · Limit refined or processed carbohydrates. Your diet should include mostly whole grains. Desserts and added sugars should be used sparingly.  · Include small amounts of heart-healthy fats. These types of fats include nuts, oils, and tub margarine. Limit saturated and trans fats. These fats have been shown to be harmful in the body.  CHOOSING FOODS   The following food groups are based on a 2000 calorie diet. See your Registered Dietitian for individual calorie needs.  Grains and Grain Products (6 to 8 servings daily)  · Eat More Often: Whole-wheat bread, brown rice, whole-grain or wheat pasta, quinoa, popcorn without added fat or salt (air popped).  · Eat Less Often: White bread, white pasta, white rice, cornbread.  Vegetables (4 to 5 servings daily)  · Eat More Often: Fresh, frozen, and canned vegetables. Vegetables may be raw, steamed, roasted, or grilled with a minimal amount of fat.  · Eat Less Often/Avoid: Creamed or fried vegetables. Vegetables in a cheese sauce.  Fruit (4 to 5 servings daily)  · Eat More Often: All fresh, canned (in natural juice), or frozen fruits. Dried fruits without added sugar. One hundred percent fruit juice (½ cup [237 mL] daily).  · Eat Less Often: Dried fruits with added sugar. Canned fruit in light or heavy syrup.  Lean Meats, Fish, and Poultry (2  servings or less daily. One serving is 3 to 4 oz [85-114 g]).  · Eat More Often: Ninety percent or leaner ground beef, tenderloin, sirloin. Round cuts of beef, chicken breast, turkey breast. All fish. Grill, bake, or broil your meat. Nothing should be fried.  · Eat Less Often/Avoid: Fatty cuts of meat, turkey, or chicken leg, thigh, or wing. Fried cuts of meat or fish.  Dairy (2 to 3 servings)  · Eat More Often: Low-fat or fat-free milk, low-fat plain or light yogurt, reduced-fat or part-skim cheese.  · Eat Less Often/Avoid: Milk (whole, 2%). Whole milk yogurt. Full-fat cheeses.  Nuts, Seeds, and Legumes (4 to 5 servings per week)  · Eat More Often: All without added salt.  · Eat Less Often/Avoid: Salted nuts and seeds, canned beans with added salt.  Fats and Sweets (limited)  · Eat More Often: Vegetable oils, tub margarines without trans fats, sugar-free gelatin. Mayonnaise and salad dressings.  · Eat Less Often/Avoid: Coconut oils, palm oils, butter, stick margarine, cream, half and half, cookies, candy, pie.  FOR MORE INFORMATION  The Dash Diet Eating Plan: www.dashdiet.org  Document Released: 11/07/2011 Document Revised: 02/10/2012 Document Reviewed: 11/07/2011  ExitCare® Patient Information ©2014 [employer].

## 2014-02-04 ENCOUNTER — Ambulatory Visit (HOSPITAL_COMMUNITY)
Admission: RE | Admit: 2014-02-04 | Discharge: 2014-02-04 | Disposition: A | Discharge status: Home / Self Care | Payer: 59 | Source: Ambulatory Visit | Attending: Family Medicine | Admitting: Family Medicine

## 2014-02-04 ENCOUNTER — Encounter: Payer: Self-pay | Admitting: Family Medicine

## 2014-02-04 DIAGNOSIS — R079 Chest pain, unspecified: Secondary | ICD-10-CM | POA: Insufficient documentation

## 2014-02-04 LAB — T4: T4 TOTAL: 8.9 ug/dL (ref 5.0–12.5)

## 2014-02-04 LAB — TSH: TSH: 1.807 u[IU]/mL (ref 0.350–4.500)

## 2014-02-04 NOTE — Progress Notes (Signed)
Patient notified and verbalized understanding of the test results. No further questions. 

## 2014-03-07 ENCOUNTER — Encounter: Payer: Self-pay | Admitting: Family Medicine

## 2014-06-13 ENCOUNTER — Telehealth: Payer: Self-pay | Admitting: Family Medicine

## 2014-06-13 MED ORDER — SCOPOLAMINE 1 MG/3DAYS TD PT72: 1.0000 | MEDICATED_PATCH | TRANSDERMAL | Status: DC

## 2014-06-13 NOTE — Telephone Encounter (Signed)
Med sent to pharmacy. Left message on voicemail notifying patient.

## 2014-06-13 NOTE — Telephone Encounter (Signed)
Pt going on a cruise and would like a nausea patch called into   [facility] [place]

## 2014-07-12 ENCOUNTER — Ambulatory Visit (INDEPENDENT_AMBULATORY_CARE_PROVIDER_SITE_OTHER): Payer: 59 | Admitting: Family Medicine

## 2014-07-12 ENCOUNTER — Encounter: Payer: Self-pay | Admitting: Family Medicine

## 2014-07-12 VITALS — BP 154/98 | Temp 98.4°F | Ht 59.5 in | Wt 180.0 lb

## 2014-07-12 DIAGNOSIS — I10 Essential (primary) hypertension: Secondary | ICD-10-CM

## 2014-07-12 MED ORDER — ENALAPRIL MALEATE 5 MG PO TABS: 5.0000 mg | ORAL_TABLET | Freq: Every day | ORAL | Status: DC

## 2014-07-12 NOTE — Patient Instructions (Signed)

## 2014-07-12 NOTE — Progress Notes (Signed)
   Subjective:    Patient ID: Brittney [name], female    DOB: July 09, 1971, 43 y.o.   MRN: [id number]  HPI Patient is here today d/t dizziness with nausea. She has felt like this on and off for a few weeks now. She has noticed that her BP has been running high as well.   Pt got back from a cruise 2 weeks ago, and is still experiencing the dizziness. Dizziness and lightheaded and a bit woozy with motion  Got bit by an insect at the beginning of July. Has some changes in the left palm skin that concerns her.  Pt had sharp pains on the right side of her groin area Sunday night. That pain has subsided. In fact pain is now gone away.  Pt has a hx of constipation as well. Does not know if that is what caused the groin pain/nausea. Takes "Smooth Move" herbal tea for the constipation and a colon cleanse. BM today and seemed to help. Further review patient had a colonoscopy last year negative.  Congestion. Off-and-on.  Stressful at times with full time work plus family responisbilities  Pt has lost weight and not exercising as much at this time.    Review of Systems No true chest pain no abdominal pain no change in bowel habits no blood in stool no loss of consciousness no seizure disorder ROS otherwise negative    Objective:   Physical Exam  Alert mild malaise. Blood pressure 142/90 4 repeat HEENT sinus congestion pharynx normal. Lungs clear. Heart regular in rhythm. Abdominal exam completely benign. Left palm skin changes noted      Assessment & Plan:  Impression hypertension patient has had history of this off-and-on since having pregnancy. Strong family history. Now with a lot of symptoms some of which may be associated. #2 dizziness with some headache diffuse mild in nature. #3 constipation discussed patient's had a mega-workup already. #4 left hand skin changes postinflammatory discussed reassured plan initiate enalapril 5 mg daily. Followup with Dr. Nicki Reaper one month. Diet exercise discussed.  WSL

## 2014-08-12 ENCOUNTER — Ambulatory Visit (INDEPENDENT_AMBULATORY_CARE_PROVIDER_SITE_OTHER): Payer: 59 | Admitting: Family Medicine

## 2014-08-12 ENCOUNTER — Encounter: Payer: Self-pay | Admitting: Family Medicine

## 2014-08-12 VITALS — BP 118/72 | Ht 59.5 in | Wt 179.0 lb

## 2014-08-12 DIAGNOSIS — I10 Essential (primary) hypertension: Secondary | ICD-10-CM

## 2014-08-12 NOTE — Progress Notes (Signed)
   Subjective:    Patient ID: Brittney [name], female    DOB: 1971-09-05, 43 y.o.   MRN: [id number]  Hypertension This is a chronic problem. Pertinent negatives include no chest pain or shortness of breath. Compliance problems include diet and exercise.   Patient has no other concerns.  Patient tried to do a better job watching how she and exercise. We talked about strategies to lose weight  Review of Systems  Constitutional: Negative for activity change, appetite change and fatigue.  HENT: Negative for congestion and dental problem.   Respiratory: Negative for cough and shortness of breath.   Cardiovascular: Negative for chest pain.  Endocrine: Negative for polydipsia and polyphagia.  Genitourinary: Negative for frequency.  Neurological: Negative for weakness.  Psychiatric/Behavioral: Negative for confusion.       Objective:   Physical Exam  Vitals reviewed. Constitutional: She appears well-nourished. No distress.  Cardiovascular: Normal rate, regular rhythm and normal heart sounds.   No murmur heard. Pulmonary/Chest: Effort normal and breath sounds normal. No respiratory distress.  Musculoskeletal: She exhibits no edema.  Lymphadenopathy:    She has no cervical adenopathy.  Neurological: She is alert. She exhibits normal muscle tone.  Psychiatric: Her behavior is normal.          Assessment & Plan:  1. Essential hypertension, benign Patient's blood pressure overall good. Continue current medication watch diet closely exercise on regular basis try to bring weight female. Possibly will be able to stop the medication if dramatic improvement with weight and blood pressure  #2-reflux-intermittent symptoms omeprazole when necessary  #3 Will need comprehensive lab work in the spring

## 2014-08-12 NOTE — Patient Instructions (Signed)
Diabetes Mellitus and Food It is important for you to manage your blood sugar (glucose) level. Your blood glucose level can be greatly affected by what you eat. Eating healthier foods in the appropriate amounts throughout the day at about the same time each day will help you control your blood glucose level. It can also help slow or prevent worsening of your diabetes mellitus. Healthy eating may even help you improve the level of your blood pressure and reach or maintain a healthy weight.  HOW CAN FOOD AFFECT ME? Carbohydrates Carbohydrates affect your blood glucose level more than any other type of food. Your dietitian will help you determine how many carbohydrates to eat at each meal and teach you how to count carbohydrates. Counting carbohydrates is important to keep your blood glucose at a healthy level, especially if you are using insulin or taking certain medicines for diabetes mellitus. Alcohol Alcohol can cause sudden decreases in blood glucose (hypoglycemia), especially if you use insulin or take certain medicines for diabetes mellitus. Hypoglycemia can be a life-threatening condition. Symptoms of hypoglycemia (sleepiness, dizziness, and disorientation) are similar to symptoms of having too much alcohol.  If your health care provider has given you approval to drink alcohol, do so in moderation and use the following guidelines:  Women should not have more than one drink per day, and men should not have more than two drinks per day. One drink is equal to:  12 oz of beer.  5 oz of wine.  1 oz of hard liquor.  Do not drink on an empty stomach.  Keep yourself hydrated. Have water, diet soda, or unsweetened iced tea.  Regular soda, juice, and other mixers might contain a lot of carbohydrates and should be counted. WHAT FOODS ARE NOT RECOMMENDED? As you make food choices, it is important to remember that all foods are not the same. Some foods have fewer nutrients per serving than other  foods, even though they might have the same number of calories or carbohydrates. It is difficult to get your body what it needs when you eat foods with fewer nutrients. Examples of foods that you should avoid that are high in calories and carbohydrates but low in nutrients include:  Trans fats (most processed foods list trans fats on the Nutrition Facts label).  Regular soda.  Juice.  Candy.  Sweets, such as cake, pie, doughnuts, and cookies.  Fried foods. WHAT FOODS CAN I EAT? Have nutrient-rich foods, which will nourish your body and keep you healthy. The food you should eat also will depend on several factors, including:  The calories you need.  The medicines you take.  Your weight.  Your blood glucose level.  Your blood pressure level.  Your cholesterol level. You also should eat a variety of foods, including:  Protein, such as meat, poultry, fish, tofu, nuts, and seeds (lean animal proteins are best).  Fruits.  Vegetables.  Dairy products, such as milk, cheese, and yogurt (low fat is best).  Breads, grains, pasta, cereal, rice, and beans.  Fats such as olive oil, trans fat-free margarine, canola oil, avocado, and olives. DOES EVERYONE WITH DIABETES MELLITUS HAVE THE SAME MEAL PLAN? Because every person with diabetes mellitus is different, there is not one meal plan that works for everyone. It is very important that you meet with a dietitian who will help you create a meal plan that is just right for you. Document Released: 08/15/2005 Document Revised: 11/23/2013 Document Reviewed: 10/15/2013 ExitCare Patient Information 2015 [employer]. This   information is not intended to replace advice given to you by your health care provider. Make sure you discuss any questions you have with your health care provider. DASH Eating Plan DASH stands for "Dietary Approaches to Stop Hypertension." The DASH eating plan is a healthy eating plan that has been shown to reduce high  blood pressure (hypertension). Additional health benefits may include reducing the risk of type 2 diabetes mellitus, heart disease, and stroke. The DASH eating plan may also help with weight loss. WHAT DO I NEED TO KNOW ABOUT THE DASH EATING PLAN? For the DASH eating plan, you will follow these general guidelines:  Choose foods with a percent daily value for sodium of less than 5% (as listed on the food label).  Use salt-free seasonings or herbs instead of table salt or sea salt.  Check with your health care provider or pharmacist before using salt substitutes.  Eat lower-sodium products, often labeled as "lower sodium" or "no salt added."  Eat fresh foods.  Eat more vegetables, fruits, and low-fat dairy products.  Choose whole grains. Look for the word "whole" as the first word in the ingredient list.  Choose fish and skinless chicken or turkey more often than red meat. Limit fish, poultry, and meat to 6 oz (170 g) each day.  Limit sweets, desserts, sugars, and sugary drinks.  Choose heart-healthy fats.  Limit cheese to 1 oz (28 g) per day.  Eat more home-cooked food and less restaurant, buffet, and fast food.  Limit fried foods.  Cook foods using methods other than frying.  Limit canned vegetables. If you do use them, rinse them well to decrease the sodium.  When eating at a restaurant, ask that your food be prepared with less salt, or no salt if possible. WHAT FOODS CAN I EAT? Seek help from a dietitian for individual calorie needs. Grains Whole grain or whole wheat bread. Brown rice. Whole grain or whole wheat pasta. Quinoa, bulgur, and whole grain cereals. Low-sodium cereals. Corn or whole wheat flour tortillas. Whole grain cornbread. Whole grain crackers. Low-sodium crackers. Vegetables Fresh or frozen vegetables (raw, steamed, roasted, or grilled). Low-sodium or reduced-sodium tomato and vegetable juices. Low-sodium or reduced-sodium tomato sauce and paste. Low-sodium  or reduced-sodium canned vegetables.  Fruits All fresh, canned (in natural juice), or frozen fruits. Meat and Other Protein Products Ground beef (85% or leaner), grass-fed beef, or beef trimmed of fat. Skinless chicken or turkey. Ground chicken or turkey. Pork trimmed of fat. All fish and seafood. Eggs. Dried beans, peas, or lentils. Unsalted nuts and seeds. Unsalted canned beans. Dairy Low-fat dairy products, such as skim or 1% milk, 2% or reduced-fat cheeses, low-fat ricotta or cottage cheese, or plain low-fat yogurt. Low-sodium or reduced-sodium cheeses. Fats and Oils Tub margarines without trans fats. Light or reduced-fat mayonnaise and salad dressings (reduced sodium). Avocado. Safflower, olive, or canola oils. Natural peanut or almond butter. Other Unsalted popcorn and pretzels. The items listed above may not be a complete list of recommended foods or beverages. Contact your dietitian for more options. WHAT FOODS ARE NOT RECOMMENDED? Grains White bread. White pasta. White rice. Refined cornbread. Bagels and croissants. Crackers that contain trans fat. Vegetables Creamed or fried vegetables. Vegetables in a cheese sauce. Regular canned vegetables. Regular canned tomato sauce and paste. Regular tomato and vegetable juices. Fruits Dried fruits. Canned fruit in light or heavy syrup. Fruit juice. Meat and Other Protein Products Fatty cuts of meat. Ribs, chicken wings, bacon, sausage, bologna, salami, chitterlings, fatback, hot   dogs, bratwurst, and packaged luncheon meats. Salted nuts and seeds. Canned beans with salt. Dairy Whole or 2% milk, cream, half-and-half, and cream cheese. Whole-fat or sweetened yogurt. Full-fat cheeses or blue cheese. Nondairy creamers and whipped toppings. Processed cheese, cheese spreads, or cheese curds. Condiments Onion and garlic salt, seasoned salt, table salt, and sea salt. Canned and packaged gravies. Worcestershire sauce. Tartar sauce. Barbecue sauce.  Teriyaki sauce. Soy sauce, including reduced sodium. Steak sauce. Fish sauce. Oyster sauce. Cocktail sauce. Horseradish. Ketchup and mustard. Meat flavorings and tenderizers. Bouillon cubes. Hot sauce. Tabasco sauce. Marinades. Taco seasonings. Relishes. Fats and Oils Butter, stick margarine, lard, shortening, ghee, and bacon fat. Coconut, palm kernel, or palm oils. Regular salad dressings. Other Pickles and olives. Salted popcorn and pretzels. The items listed above may not be a complete list of foods and beverages to avoid. Contact your dietitian for more information. WHERE CAN I FIND MORE INFORMATION? National Heart, Lung, and Blood Institute: www.nhlbi.nih.gov/health/health-topics/topics/dash/ Document Released: 11/07/2011 Document Revised: 04/04/2014 Document Reviewed: 09/22/2013 ExitCare Patient Information 2015 Exit[employer]. This information is not intended to replace advice given to you by your health care provider. Make sure you discuss any questions you have with your health care provider.  

## 2014-08-22 ENCOUNTER — Telehealth: Payer: Self-pay | Admitting: Family Medicine

## 2014-08-22 MED ORDER — ENALAPRIL MALEATE 5 MG PO TABS: 5.0000 mg | ORAL_TABLET | Freq: Every day | ORAL | Status: DC

## 2014-08-22 NOTE — Telephone Encounter (Signed)
Rx sent electronically to pharmacy. Patient notified. 

## 2014-08-22 NOTE — Telephone Encounter (Signed)
Patient needs Rx for enalapril to [facility].

## 2014-08-29 ENCOUNTER — Encounter: Payer: Self-pay | Admitting: Family Medicine

## 2014-09-02 ENCOUNTER — Encounter: Payer: Self-pay | Admitting: Family Medicine

## 2014-10-14 ENCOUNTER — Other Ambulatory Visit (HOSPITAL_COMMUNITY): Payer: Self-pay | Admitting: Obstetrics and Gynecology

## 2014-10-14 DIAGNOSIS — Z1231 Encounter for screening mammogram for malignant neoplasm of breast: Secondary | ICD-10-CM

## 2014-10-19 ENCOUNTER — Ambulatory Visit (HOSPITAL_COMMUNITY)
Admission: RE | Admit: 2014-10-19 | Discharge: 2014-10-19 | Disposition: A | Discharge status: Home / Self Care | Payer: 59 | Source: Ambulatory Visit | Attending: Obstetrics and Gynecology | Admitting: Obstetrics and Gynecology

## 2014-10-19 DIAGNOSIS — Z803 Family history of malignant neoplasm of breast: Secondary | ICD-10-CM | POA: Insufficient documentation

## 2014-10-19 DIAGNOSIS — Z1231 Encounter for screening mammogram for malignant neoplasm of breast: Secondary | ICD-10-CM | POA: Insufficient documentation

## 2015-05-03 ENCOUNTER — Telehealth: Payer: Self-pay | Admitting: Family Medicine

## 2015-05-03 DIAGNOSIS — Z79899 Other long term (current) drug therapy: Secondary | ICD-10-CM

## 2015-05-03 NOTE — Telephone Encounter (Signed)
Last seen 08/2014 on vasotec

## 2015-05-03 NOTE — Telephone Encounter (Signed)
Pt called stating that her bp meds are making her cough way to much and wants to know what can be done about it.

## 2015-05-04 NOTE — Telephone Encounter (Signed)
Approximately 5-8% of all patients who take ace inhibitors will get a cough. The best thing to do is stop the medicine we can start a different medicine called losartan 50 mg 1 daily. This works similar to ace inhibitors without cough as a side effect. #30 with 3 refills it is standard in customary to check a metabolic 7 in 3-4 weeks it is also standard and customary to recommend a follow-up office visit within 4-6 weeks. This can be with Hoyle Sauer or myself. It is also important to list cough as a side effect with Vasotec. Technically it is not an allergy but the wonderful electronic system Epic will only allow you to put it under that allergy heading .

## 2015-05-05 MED ORDER — LOSARTAN POTASSIUM 50 MG PO TABS: 50.0000 mg | ORAL_TABLET | Freq: Every day | ORAL | Status: DC

## 2015-05-05 NOTE — Telephone Encounter (Signed)
Discussed with patient. Patient advised Approximately 5-8% of all patients who take ace inhibitors will get a cough. The best thing to do is stop the medicine we can start a different medicine called losartan 50 mg 1 daily. This works similar to ace inhibitors without cough as a side effect. #30 with 3 refills it is standard in customary to check a metabolic 7 in 3-4 weeks it is also standard and customary to recommend a follow-up office visit within 4-6 weeks. This can be with Hoyle Sauer or Dr Nicki Reaper.  It is also important to list cough as a side effect with Vasotec. Technically it is not an allergy but the wonderful electronic system Epic will only allow you to put it under that allergy heading . Patient verbalized understanding. Patient scheduled follow up office visit. Rx sent electronically to pharmacy. Blood work orders placed in [facility].

## 2015-05-30 LAB — BASIC METABOLIC PANEL
BUN/Creatinine Ratio: 11 (ref 9–23)
BUN: 7 mg/dL (ref 6–24)
CHLORIDE: 102 mmol/L (ref 97–108)
CO2: 25 mmol/L (ref 18–29)
Calcium: 9.4 mg/dL (ref 8.7–10.2)
Creatinine, Ser: 0.66 mg/dL (ref 0.57–1.00)
GFR calc non Af Amer: 107 mL/min/{1.73_m2} (ref >59)
GFR, EST AFRICAN AMERICAN: 123 mL/min/{1.73_m2} (ref >59)
GLUCOSE: 99 mg/dL (ref 65–99)
POTASSIUM: 4.4 mmol/L (ref 3.5–5.2)
Sodium: 140 mmol/L (ref 134–144)

## 2015-06-02 ENCOUNTER — Ambulatory Visit: Payer: Self-pay | Admitting: Nurse Practitioner

## 2015-06-13 ENCOUNTER — Ambulatory Visit (INDEPENDENT_AMBULATORY_CARE_PROVIDER_SITE_OTHER): Payer: 59 | Admitting: Nurse Practitioner

## 2015-06-13 VITALS — BP 130/82 | Ht 59.5 in | Wt 184.1 lb

## 2015-06-13 DIAGNOSIS — K2100 Gastro-esophageal reflux disease with esophagitis, without bleeding: Secondary | ICD-10-CM

## 2015-06-13 DIAGNOSIS — I10 Essential (primary) hypertension: Secondary | ICD-10-CM | POA: Diagnosis not present

## 2015-06-15 ENCOUNTER — Encounter: Payer: Self-pay | Admitting: Nurse Practitioner

## 2015-06-15 NOTE — Progress Notes (Signed)
Subjective:  Presents for recheck on her blood pressure. BP outside the office has been normal. Recently bought a treadmill, regular walking program. Very active. Note that she drink a smoothie a.m. before her recent fasting labs. Has a history of intermittent chest pain according to chart, workup has been normal. Having a heaviness in her chest last week mainly at nighttime, has eased off lately. Lower localized sternal to mid chest area between the breast. No history of smoking. No fever, cough, hemoptysis or shortness of breath. No chest pain or shortness of breath with activity. History of GERD. No flare up lately. Gets physicals with GYN.  Objective:   BP 130/82 mmHg  Ht 4' 11.5" (1.511 m)  Wt 184 lb 2 oz (83.519 kg)  BMI 36.58 kg/m2 NAD. Alert, oriented. Lungs clear. Heart RRR. Abdomen soft, non distended mildly obese with mild upper epigastric area tenderness. No rebound or guarding.   Assessment:  Problem List Items Addressed This Visit      Cardiovascular and Mediastinum   Essential hypertension, benign - Primary    Other Visit Diagnoses    Gastroesophageal reflux disease with esophagitis          Plan: restart OTC Prilosec as directed. Continue Cozaar as directed. Discussed weight loss and dietary measures for GERD. Return in about 6 months (around 12/14/2015) for recheck. Call back sooner if no improvement or worse.

## 2015-08-21 ENCOUNTER — Other Ambulatory Visit (HOSPITAL_COMMUNITY): Payer: Self-pay | Admitting: Obstetrics and Gynecology

## 2015-08-21 DIAGNOSIS — R229 Localized swelling, mass and lump, unspecified: Principal | ICD-10-CM

## 2015-08-21 DIAGNOSIS — IMO0002 Reserved for concepts with insufficient information to code with codable children: Secondary | ICD-10-CM

## 2015-08-22 ENCOUNTER — Encounter (HOSPITAL_COMMUNITY): Payer: Self-pay

## 2015-09-12 ENCOUNTER — Ambulatory Visit (HOSPITAL_COMMUNITY)
Admission: RE | Admit: 2015-09-12 | Discharge: 2015-09-12 | Disposition: A | Discharge status: Home / Self Care | Payer: 59 | Source: Ambulatory Visit | Attending: Obstetrics and Gynecology | Admitting: Obstetrics and Gynecology

## 2015-09-12 ENCOUNTER — Other Ambulatory Visit (HOSPITAL_COMMUNITY): Payer: Self-pay | Admitting: Obstetrics and Gynecology

## 2015-09-12 DIAGNOSIS — R229 Localized swelling, mass and lump, unspecified: Principal | ICD-10-CM

## 2015-09-12 DIAGNOSIS — IMO0002 Reserved for concepts with insufficient information to code with codable children: Secondary | ICD-10-CM

## 2015-09-12 DIAGNOSIS — N63 Unspecified lump in breast: Secondary | ICD-10-CM | POA: Diagnosis not present

## 2015-10-02 ENCOUNTER — Ambulatory Visit (INDEPENDENT_AMBULATORY_CARE_PROVIDER_SITE_OTHER): Payer: 59 | Admitting: Family Medicine

## 2015-10-02 ENCOUNTER — Encounter: Payer: Self-pay | Admitting: Family Medicine

## 2015-10-02 VITALS — BP 128/82 | Ht 59.5 in | Wt 180.4 lb

## 2015-10-02 DIAGNOSIS — H8110 Benign paroxysmal vertigo, unspecified ear: Secondary | ICD-10-CM | POA: Diagnosis not present

## 2015-10-02 NOTE — Progress Notes (Signed)
   Subjective:    Patient ID: Brittney [name], female    DOB: 1971-04-10, 44 y.o.   MRN: [id number]  HPI Patient describes when she went to the ER she had onset of severe ataxia she felt like the room was spinning she was nauseous she denied any unilateral numbness or weakness. Did get better after couple days she presents here today for follow-up. States if she moves too quickly she has trouble but otherwise she is doing better. Patient arrives for a follow up from ER for vertigo. Patient still somewhat dizzy with movement but better than when she went to ER. Review of Systems  Constitutional: Negative for fever and fatigue.  HENT: Negative for congestion.   Respiratory: Negative for cough and shortness of breath.   Gastrointestinal: Negative for nausea and abdominal pain.  Neurological: Negative for dizziness.       Objective:   Physical Exam  Constitutional: She appears well-developed.  HENT:  Head: Normocephalic.  Nose: Nose normal.  Mouth/Throat: Oropharynx is clear and moist. No oropharyngeal exudate.  Neck: Neck supple.  Cardiovascular: Normal rate and normal heart sounds.   No murmur heard. Pulmonary/Chest: Effort normal and breath sounds normal. She has no wheezes.  Lymphadenopathy:    She has no cervical adenopathy.  Skin: Skin is warm and dry.  Nursing note and vitals reviewed.   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

## 2015-12-25 ENCOUNTER — Encounter: Payer: Self-pay | Admitting: Family Medicine

## 2015-12-25 DIAGNOSIS — R42 Dizziness and giddiness: Secondary | ICD-10-CM

## 2015-12-25 MED FILL — LOSARTAN POTASSIUM 50 MG TA: 50 | 30 days supply | Qty: 30 | Fill #6

## 2015-12-25 NOTE — Telephone Encounter (Signed)
I would recommend that we go forward with setting you up with ear nose throat. Our staff will help set up appointment with ENT. They will notify you of this appointment somewhere within the next 7-10 days. If you have specific request regarding this appointment please contact [place] at the office. In some cases if ENT cannot define the problem we will have to do a consultation with neurology. I would start with ENT first. Thank you-Dr. Nicki Reaper  Nurse's-please set up ENT evaluation for reoccurring vertigo/dizziness

## 2015-12-26 NOTE — Addendum Note (Signed)
Addended by: Ofilia Neas R on: 12/26/2015 08:08 AM   Modules accepted: Orders

## 2015-12-27 ENCOUNTER — Encounter: Payer: Self-pay | Admitting: Family Medicine

## 2016-01-26 MED FILL — LOSARTAN POTASSIUM 50 MG TA: 50 | 30 days supply | Qty: 30 | Fill #7

## 2016-02-20 ENCOUNTER — Other Ambulatory Visit (HOSPITAL_COMMUNITY): Payer: Self-pay | Admitting: Obstetrics and Gynecology

## 2016-02-20 DIAGNOSIS — Z1231 Encounter for screening mammogram for malignant neoplasm of breast: Secondary | ICD-10-CM

## 2016-02-26 ENCOUNTER — Ambulatory Visit (HOSPITAL_COMMUNITY): Payer: 59

## 2016-02-26 ENCOUNTER — Other Ambulatory Visit (HOSPITAL_COMMUNITY): Payer: Self-pay | Admitting: Obstetrics and Gynecology

## 2016-02-26 ENCOUNTER — Ambulatory Visit (HOSPITAL_COMMUNITY)
Admission: RE | Admit: 2016-02-26 | Discharge: 2016-02-26 | Disposition: A | Discharge status: Home / Self Care | Payer: 59 | Source: Ambulatory Visit | Attending: Obstetrics and Gynecology | Admitting: Obstetrics and Gynecology

## 2016-02-26 DIAGNOSIS — Z1231 Encounter for screening mammogram for malignant neoplasm of breast: Secondary | ICD-10-CM

## 2016-03-04 MED FILL — LOSARTAN POTASSIUM 50 MG TA: 50 | 30 days supply | Qty: 30 | Fill #8

## 2016-04-05 MED FILL — LOSARTAN POTASSIUM 50 MG TA: 50 | 30 days supply | Qty: 30 | Fill #9

## 2016-05-21 ENCOUNTER — Other Ambulatory Visit: Payer: Self-pay | Admitting: Family Medicine

## 2016-05-21 MED FILL — LOSARTAN POTASSIUM 50 MG TA: 50 | 30 days supply | Qty: 30 | Fill #0

## 2016-07-02 MED FILL — LOSARTAN POTASSIUM 50 MG TA: 50 | 30 days supply | Qty: 30 | Fill #1

## 2016-07-12 ENCOUNTER — Encounter: Payer: Self-pay | Admitting: Family Medicine

## 2016-07-12 ENCOUNTER — Ambulatory Visit (INDEPENDENT_AMBULATORY_CARE_PROVIDER_SITE_OTHER): Payer: 59 | Admitting: Family Medicine

## 2016-07-12 VITALS — BP 136/86 | Ht 59.0 in | Wt 182.4 lb

## 2016-07-12 DIAGNOSIS — R5383 Other fatigue: Secondary | ICD-10-CM | POA: Diagnosis not present

## 2016-07-12 DIAGNOSIS — L659 Nonscarring hair loss, unspecified: Secondary | ICD-10-CM

## 2016-07-12 DIAGNOSIS — I10 Essential (primary) hypertension: Secondary | ICD-10-CM | POA: Diagnosis not present

## 2016-07-12 DIAGNOSIS — G4719 Other hypersomnia: Secondary | ICD-10-CM

## 2016-07-12 MED ORDER — LOSARTAN POTASSIUM 50 MG PO TABS: 50.0000 mg | ORAL_TABLET | Freq: Every day | ORAL | 2 refills | Status: DC

## 2016-07-12 NOTE — Progress Notes (Signed)
   Subjective:    Patient ID: Brittney [name], female    DOB: 13-Aug-1971, 45 y.o.   MRN: [id number]  Hypertension  This is a chronic problem. The current episode started more than 1 year ago. Pertinent negatives include no chest pain. Risk factors for coronary artery disease include dyslipidemia and obesity. Treatments tried: cozaar. There are no compliance problems.     Patient reports no energy, Significant fatigue she relates snoring at nighttime at times feels sleepy throughout the day. She is not quite sure if she pauses when she breathes.  Moderate obesity she is trying to lose weight  Significant fatigue tiredness she doesn't know she may be low on her hemoglobin or if it's related to the poor sleep    Review of Systems  Constitutional: Negative for activity change, appetite change and fatigue.  HENT: Negative for congestion.   Respiratory: Negative for cough.   Cardiovascular: Negative for chest pain.  Gastrointestinal: Negative for abdominal pain.  Endocrine: Negative for polydipsia and polyphagia.  Neurological: Negative for weakness.  Psychiatric/Behavioral: Negative for confusion.       Objective:   Physical Exam  Constitutional: She appears well-nourished. No distress.  Cardiovascular: Normal rate, regular rhythm and normal heart sounds.   No murmur heard. Pulmonary/Chest: Effort normal and breath sounds normal. No respiratory distress.  Musculoskeletal: She exhibits no edema.  Lymphadenopathy:    She has no cervical adenopathy.  Neurological: She is alert. She exhibits normal muscle tone.  Psychiatric: Her behavior is normal.  Vitals reviewed.   25 minutes was spent with the patient. Greater than half the time was spent in discussion and answering questions and counseling regarding the issues that the patient came in for today.       Assessment & Plan:  Patient concerned about vitamin D this will be checked Fatigue tiredness losing hair-check thyroid  function She is to do a sleep survey return this to [place] may need a sleep study  CBC ordered because of fatigue tiredness check for anemia  Hypertension continue current medications watch diet closely check metabolic 7 Screening lipid profile recommended Exercise watching diet losing weight recommended. More than likely follow-up in 6 months sooner problems

## 2016-07-19 LAB — CBC WITH DIFFERENTIAL/PLATELET
BASOS: 0 %
Basophils Absolute: 0 10*3/uL (ref 0.0–0.2)
EOS (ABSOLUTE): 0.1 10*3/uL (ref 0.0–0.4)
EOS: 1 %
HEMATOCRIT: 39 % (ref 34.0–46.6)
Hemoglobin: 13.1 g/dL (ref 11.1–15.9)
IMMATURE GRANS (ABS): 0 10*3/uL (ref 0.0–0.1)
Immature Granulocytes: 0 %
LYMPHS: 29 %
Lymphocytes Absolute: 1.4 10*3/uL (ref 0.7–3.1)
MCH: 30.5 pg (ref 26.6–33.0)
MCHC: 33.6 g/dL (ref 31.5–35.7)
MCV: 91 fL (ref 79–97)
MONOS ABS: 0.4 10*3/uL (ref 0.1–0.9)
Monocytes: 8 %
NEUTROS ABS: 2.9 10*3/uL (ref 1.4–7.0)
Neutrophils: 62 %
PLATELETS: 335 10*3/uL (ref 150–379)
RBC: 4.3 x10E6/uL (ref 3.77–5.28)
RDW: 14.1 % (ref 12.3–15.4)
WBC: 4.7 10*3/uL (ref 3.4–10.8)

## 2016-07-19 LAB — LIPID PANEL
CHOLESTEROL TOTAL: 190 mg/dL (ref 100–199)
Chol/HDL Ratio: 3.2 ratio units (ref 0.0–4.4)
HDL: 59 mg/dL (ref >39)
LDL CALC: 118 mg/dL — AB (ref 0–99)
TRIGLYCERIDES: 65 mg/dL (ref 0–149)
VLDL CHOLESTEROL CAL: 13 mg/dL (ref 5–40)

## 2016-07-19 LAB — BASIC METABOLIC PANEL
BUN/Creatinine Ratio: 10 (ref 9–23)
BUN: 7 mg/dL (ref 6–24)
CHLORIDE: 98 mmol/L (ref 96–106)
CO2: 23 mmol/L (ref 18–29)
CREATININE: 0.7 mg/dL (ref 0.57–1.00)
Calcium: 9.8 mg/dL (ref 8.7–10.2)
GFR calc Af Amer: 120 mL/min/{1.73_m2} (ref >59)
GFR calc non Af Amer: 104 mL/min/{1.73_m2} (ref >59)
GLUCOSE: 98 mg/dL (ref 65–99)
Potassium: 4.7 mmol/L (ref 3.5–5.2)
Sodium: 140 mmol/L (ref 134–144)

## 2016-07-19 LAB — VITAMIN D 25 HYDROXY (VIT D DEFICIENCY, FRACTURES): Vit D, 25-Hydroxy: 17.2 ng/mL — ABNORMAL LOW (ref 30.0–100.0)

## 2016-07-19 LAB — TSH: TSH: 1.56 u[IU]/mL (ref 0.450–4.500)

## 2016-07-22 MED ORDER — VITAMIN D (ERGOCALCIFEROL) 1.25 MG (50000 UNIT) PO CAPS: 50000.0000 [IU] | ORAL_CAPSULE | ORAL | 12 refills | Status: DC

## 2016-07-22 MED FILL — VIT D2 1.25 MG (50,000 UNIT: 1.25 MG | 28 days supply | Qty: 4 | Fill #0

## 2016-07-22 NOTE — Addendum Note (Signed)
Addended by: Launa Grill on: 07/22/2016 09:29 AM   Modules accepted: Orders

## 2016-08-07 MED FILL — LOSARTAN POTASSIUM 50 MG TA: 50 | 30 days supply | Qty: 30 | Fill #2

## 2016-08-15 ENCOUNTER — Encounter: Payer: Self-pay | Admitting: Family Medicine

## 2016-09-09 MED FILL — LOSARTAN POTASSIUM 50 MG TA: 50 | 30 days supply | Qty: 30 | Fill #0

## 2016-09-18 ENCOUNTER — Encounter: Payer: Self-pay | Admitting: Nurse Practitioner

## 2016-09-18 ENCOUNTER — Ambulatory Visit (INDEPENDENT_AMBULATORY_CARE_PROVIDER_SITE_OTHER): Payer: 59 | Admitting: Nurse Practitioner

## 2016-09-18 MED ORDER — NALTREXONE-BUPROPION HCL ER 8-90 MG PO TB12: ORAL_TABLET | ORAL | 0 refills | Status: DC

## 2016-09-18 MED ORDER — NALTREXONE-BUPROPION HCL ER 8-90 MG PO TB12: ORAL_TABLET | ORAL | 1 refills | Status: DC

## 2016-09-18 NOTE — Patient Instructions (Signed)
[employer]

## 2016-09-19 ENCOUNTER — Encounter: Payer: Self-pay | Admitting: Nurse Practitioner

## 2016-09-19 MED FILL — CONTRAVE ER 8-90 MG TABLET: 8-90 | 30 days supply | Qty: 78 | Fill #0

## 2016-09-19 NOTE — Progress Notes (Signed)
Subjective:  Presents to discuss her weight. Has a job mainly sitting. Tries to eat healthy but her office has lunch brought in about every day. Does not drink any sweet beverages. Has tried to increase her activity. Would like to try medication to help her with weight loss. Had trouble taking phentermine in the past due to palpitations and jitteriness.  Objective:   BP (!) 144/90   Ht 4\' 11"  (1.499 m)   Wt 185 lb 8 oz (84.1 kg)   BMI 37.47 kg/m  NAD. Alert, oriented. Lungs clear. Heart regular rate rhythm.  Assessment:  Problem List Items Addressed This Visit      Other   Morbid obesity ([place]) - Primary   Relevant Medications   Naltrexone-Bupropion HCl ER 8-90 MG TB12   Naltrexone-Bupropion HCl ER 8-90 MG TB12    Other Visit Diagnoses   None.    Plan:  Meds ordered this encounter  Medications  . Naltrexone-Bupropion HCl ER 8-90 MG TB12    Sig: One po qam x 1 week then one po BID x 1 week then 2 po qam and one po qpm x 1 week then 2 po BID    Dispense:  90 tablet    Refill:  0    Order Specific Question:   Supervising Provider    Answer:   Mikey Kirschner [[id number]]  . Naltrexone-Bupropion HCl ER 8-90 MG TB12    Sig: 2 po BID    Dispense:  120 tablet    Refill:  1    Order Specific Question:   Supervising Provider    Answer:   Maggie Font   Reviewed potential adverse effects of medication. DC med and call if any problems. Gradually titrate dose, stop at any point if needed, does not need to progress to full dose if not necessary. Encouraged healthy diet and regular activity. Return in about 3 months (around 12/19/2016) for recheck. Call back sooner if any problems.

## 2016-10-01 ENCOUNTER — Encounter: Payer: Self-pay | Admitting: Nurse Practitioner

## 2016-10-11 MED FILL — LOSARTAN POTASSIUM 50 MG TA: 50 | 30 days supply | Qty: 30 | Fill #1

## 2016-11-14 MED FILL — LOSARTAN POTASSIUM 50 MG TA: 50 | 30 days supply | Qty: 30 | Fill #2

## 2016-12-16 ENCOUNTER — Ambulatory Visit: Payer: 59 | Admitting: Nurse Practitioner

## 2016-12-23 MED FILL — LOSARTAN POTASSIUM 50 MG TA: 50 | 30 days supply | Qty: 30 | Fill #3 | Status: TO

## 2017-01-03 DIAGNOSIS — Z01419 Encounter for gynecological examination (general) (routine) without abnormal findings: Secondary | ICD-10-CM | POA: Diagnosis not present

## 2017-01-14 ENCOUNTER — Ambulatory Visit (INDEPENDENT_AMBULATORY_CARE_PROVIDER_SITE_OTHER): Payer: 59 | Admitting: Family Medicine

## 2017-01-14 ENCOUNTER — Encounter: Payer: Self-pay | Admitting: Family Medicine

## 2017-01-14 VITALS — BP 118/76 | Temp 99.1°F | Ht 59.0 in | Wt 182.0 lb

## 2017-01-14 DIAGNOSIS — R05 Cough: Secondary | ICD-10-CM

## 2017-01-14 DIAGNOSIS — I10 Essential (primary) hypertension: Secondary | ICD-10-CM

## 2017-01-14 DIAGNOSIS — R059 Cough, unspecified: Secondary | ICD-10-CM

## 2017-01-14 DIAGNOSIS — Z79899 Other long term (current) drug therapy: Secondary | ICD-10-CM

## 2017-01-14 DIAGNOSIS — Z13220 Encounter for screening for lipoid disorders: Secondary | ICD-10-CM | POA: Diagnosis not present

## 2017-01-14 DIAGNOSIS — R109 Unspecified abdominal pain: Secondary | ICD-10-CM

## 2017-01-14 LAB — POCT URINALYSIS DIPSTICK: pH, UA: 6

## 2017-01-14 MED ORDER — CEFDINIR 300 MG PO CAPS: 300.0000 mg | ORAL_CAPSULE | Freq: Two times a day (BID) | ORAL | 0 refills | Status: DC

## 2017-01-14 MED ORDER — ALBUTEROL SULFATE HFA 108 (90 BASE) MCG/ACT IN AERS: 2.0000 | INHALATION_SPRAY | Freq: Four times a day (QID) | RESPIRATORY_TRACT | 0 refills | Status: DC | PRN

## 2017-01-14 NOTE — Progress Notes (Signed)
   Subjective:    Patient ID: Brittney [name], female    DOB: 01/31/1971, 46 y.o.   MRN: [id number]  Sinusitis  This is a new problem. Episode onset: 2 months. Associated symptoms include congestion, coughing and ear pain. (Chest pain for one month )  abdominal pain on left side, dark urination for 2 months.    Got sick the first wk of January had cough and con  Results for orders placed or performed in visit on 01/14/17  POCT urinalysis dipstick  Result Value Ref Range   Color, UA     Clarity, UA     Glucose, UA     Bilirubin, UA +    Ketones, UA     Spec Grav, UA <=1.005    Blood, UA trace    pH, UA 6.0    Protein, UA     Urobilinogen, UA     Nitrite, UA     Leukocytes, UA  Negative   Cough and achey  Does not smoke   Ongoing pain, symmetric and bilat, and thru the chest alittle sob with the coughing   Felt bad but not fever early on, no achiness   No hx of wheezing occas cough thru the night   Given zpk by tom kefalas  Urine darker lately, and darker than usual, has to strain for the urine  Right sided abd doscomfort pulling sens, uncomfort at times, exersising, for past couple weeks. BMs better,  Pos colon had thre yrs ago    Review of Systems  HENT: Positive for congestion and ear pain.   Respiratory: Positive for cough.        Objective:   Physical Exam  Alert active somewhat anxious appearing H&T slight nasal congestion lungs clear no tachypnea heart regular rhythm. Abdomen nonspecific right lateral/upper quadrant abdominal tenderness to deep palpation not discrete and not particularly sensitive      Assessment & Plan:  Impression #1 subacute cough with some anxiety on part of patient she works in a cancer center and this always seems to point towards ominous patient states would like an x-ray #2 nonspecific abdominal pain. Right upper lateral abdomen. Not particularly postprandial. Urine specimen did reveal slight bilirubin elevation plan chest  x-ray. Finish antibiotic as prescribed symptom care discussed appropriate blood work. Right upper quadrant ultrasound rationale discussed further recommendations based results

## 2017-01-20 ENCOUNTER — Ambulatory Visit (HOSPITAL_COMMUNITY)
Admission: RE | Admit: 2017-01-20 | Discharge: 2017-01-20 | Disposition: A | Discharge status: Home / Self Care | Payer: 59 | Source: Ambulatory Visit | Attending: Family Medicine | Admitting: Family Medicine

## 2017-01-20 DIAGNOSIS — Z9049 Acquired absence of other specified parts of digestive tract: Secondary | ICD-10-CM | POA: Insufficient documentation

## 2017-01-20 DIAGNOSIS — R079 Chest pain, unspecified: Secondary | ICD-10-CM | POA: Diagnosis not present

## 2017-01-20 DIAGNOSIS — R1011 Right upper quadrant pain: Secondary | ICD-10-CM | POA: Insufficient documentation

## 2017-01-20 DIAGNOSIS — R05 Cough: Secondary | ICD-10-CM | POA: Insufficient documentation

## 2017-05-07 ENCOUNTER — Other Ambulatory Visit (HOSPITAL_COMMUNITY): Payer: Self-pay | Admitting: Obstetrics and Gynecology

## 2017-05-07 DIAGNOSIS — Z1231 Encounter for screening mammogram for malignant neoplasm of breast: Secondary | ICD-10-CM

## 2017-05-15 ENCOUNTER — Ambulatory Visit (HOSPITAL_COMMUNITY)
Admission: RE | Admit: 2017-05-15 | Discharge: 2017-05-15 | Disposition: A | Discharge status: Home / Self Care | Payer: 59 | Source: Ambulatory Visit | Attending: Obstetrics and Gynecology | Admitting: Obstetrics and Gynecology

## 2017-05-15 DIAGNOSIS — Z1231 Encounter for screening mammogram for malignant neoplasm of breast: Secondary | ICD-10-CM | POA: Insufficient documentation

## 2017-05-20 ENCOUNTER — Other Ambulatory Visit: Payer: Self-pay

## 2017-05-20 ENCOUNTER — Other Ambulatory Visit: Payer: Self-pay | Admitting: Physician Assistant

## 2017-05-20 DIAGNOSIS — R928 Other abnormal and inconclusive findings on diagnostic imaging of breast: Secondary | ICD-10-CM

## 2017-05-23 ENCOUNTER — Ambulatory Visit
Admission: RE | Admit: 2017-05-23 | Discharge: 2017-05-23 | Disposition: A | Discharge status: Home / Self Care | Payer: 59 | Source: Ambulatory Visit | Attending: Physician Assistant | Admitting: Physician Assistant

## 2017-05-23 ENCOUNTER — Other Ambulatory Visit: Payer: 59

## 2017-05-23 DIAGNOSIS — R928 Other abnormal and inconclusive findings on diagnostic imaging of breast: Secondary | ICD-10-CM

## 2017-05-23 DIAGNOSIS — R922 Inconclusive mammogram: Secondary | ICD-10-CM | POA: Diagnosis not present

## 2017-06-03 ENCOUNTER — Encounter (HOSPITAL_COMMUNITY): Payer: 59

## 2017-06-30 ENCOUNTER — Other Ambulatory Visit: Payer: Self-pay | Admitting: Family Medicine

## 2017-07-21 ENCOUNTER — Other Ambulatory Visit: Payer: Self-pay | Admitting: Family Medicine

## 2017-08-13 ENCOUNTER — Other Ambulatory Visit: Payer: Self-pay | Admitting: Family Medicine

## 2017-08-17 ENCOUNTER — Other Ambulatory Visit: Payer: Self-pay | Admitting: Family Medicine

## 2017-09-09 ENCOUNTER — Ambulatory Visit: Payer: 59 | Admitting: Family Medicine

## 2017-09-20 ENCOUNTER — Other Ambulatory Visit: Payer: Self-pay | Admitting: Family Medicine

## 2017-09-22 ENCOUNTER — Telehealth: Payer: Self-pay | Admitting: Family Medicine

## 2017-09-22 MED ORDER — LOSARTAN POTASSIUM 100 MG PO TABS: 100.0000 mg | ORAL_TABLET | Freq: Every day | ORAL | 0 refills | Status: DC

## 2017-09-22 NOTE — Telephone Encounter (Signed)
Please verify with patient that she is taking losartan 50 mg every single day, if that is the case then I would recommend at this point that the medication losartan be changed to 100 mg 1 daily, #30, no refills, keep follow-up office visit.

## 2017-09-22 NOTE — Telephone Encounter (Signed)
Patient currently on cozaar 50 and last BP check up was 07/2016

## 2017-09-22 NOTE — Telephone Encounter (Signed)
Spoke with patient and patient stated that she is taking Losartan 50 MG daily. Per Dr.Scott medication increased to 100 mg daily, keep follow up appointment on 10/01/17. Patient verbalized understanding and medication was sent into the pharmacy.

## 2017-09-22 NOTE — Telephone Encounter (Signed)
Patient said on Friday night 09/19/17, her BP went up to 154/100.  Her hands and feet started swelling that night.  Ever since Friday, she has been dizzy and nauseated.  She said her BP was high yesterday but forgot what the reading was.  This morning she said it was 126/93.  She has an appointment on 10/01/17 with Dr. Nicki Reaper and wants to know what he recommends in the mean time.  [facility]

## 2017-10-01 ENCOUNTER — Encounter: Payer: Self-pay | Admitting: Family Medicine

## 2017-10-01 ENCOUNTER — Ambulatory Visit (INDEPENDENT_AMBULATORY_CARE_PROVIDER_SITE_OTHER): Payer: 59 | Admitting: Family Medicine

## 2017-10-01 VITALS — BP 112/70 | Ht 59.0 in | Wt 178.6 lb

## 2017-10-01 DIAGNOSIS — R1030 Lower abdominal pain, unspecified: Secondary | ICD-10-CM

## 2017-10-01 DIAGNOSIS — E785 Hyperlipidemia, unspecified: Secondary | ICD-10-CM | POA: Diagnosis not present

## 2017-10-01 DIAGNOSIS — I10 Essential (primary) hypertension: Secondary | ICD-10-CM

## 2017-10-01 DIAGNOSIS — E559 Vitamin D deficiency, unspecified: Secondary | ICD-10-CM

## 2017-10-01 MED ORDER — LOSARTAN POTASSIUM 100 MG PO TABS: 100.0000 mg | ORAL_TABLET | Freq: Every day | ORAL | 3 refills | Status: DC

## 2017-10-01 NOTE — Patient Instructions (Addendum)
Fibercon powder   1 to 2 tsp powder twice daily in 8 oz of water  If progressive abd pain then next step is appointment with Dr Laural Golden  See your GYN and do a ultrasound

## 2017-10-01 NOTE — Progress Notes (Signed)
   Subjective:    Patient ID: Brittney [name], female    DOB: 12/24/1970, 46 y.o.   MRN: [id number]  Hypertension  This is a chronic problem. The current episode started more than 1 year ago. Pertinent negatives include no chest pain, headaches or shortness of breath. Treatments tried: cozaar. There are no compliance problems.    Nausea off and on for a while and pains in stomach patient relates lower abdominal pains and discomfort, though almost every single day last for a few minutes at a time lower abdominal region and then radiates up the right side no right upper quadrant pain no back pain denies nausea denies vomiting fever chills dysuria denies rectal bleeding she states she did have irregular spotting went to her gynecologist who recommended a ultrasound she never got it done patient denies any night sweats fever chills or weight loss patient battling moderate obesity she does not  Always watch how she eats and does not exercise on a regular basis-also recommend that she follow-up regarding Review of Systems  Constitutional: Negative for activity change, fatigue and fever.  HENT: Negative for congestion.   Respiratory: Negative for cough, chest tightness and shortness of breath.   Cardiovascular: Negative for chest pain and leg swelling.  Gastrointestinal: Negative for abdominal pain.  Skin: Negative for color change.  Neurological: Negative for headaches.  Psychiatric/Behavioral: Negative for behavioral problems.       Objective:   Physical Exam  Constitutional: She appears well-developed and well-nourished. No distress.  HENT:  Head: Normocephalic and atraumatic.  Eyes: Right eye exhibits no discharge. Left eye exhibits no discharge.  Neck: No tracheal deviation present.  Cardiovascular: Normal rate, regular rhythm and normal heart sounds.   No murmur heard. Pulmonary/Chest: Effort normal and breath sounds normal. No respiratory distress. She has no wheezes. She has no rales.    Abdominal: Soft. She exhibits no distension. There is no tenderness. There is no rebound.  Musculoskeletal: She exhibits no edema.  Lymphadenopathy:    She has no cervical adenopathy.  Neurological: She is alert. She exhibits normal muscle tone.  Skin: Skin is warm and dry. No erythema.  Psychiatric: Her behavior is normal.  Vitals reviewed.   25 minutes was spent with the patient. Greater than half the time was spent in discussion and answering questions and counseling regarding the issues that the patient came in for today.       Assessment & Plan:  HTN-watch his diet continue medication try to lose weight  Moderate obesity encouraged patient to try to lose weight watch diet portion control regular physical activity  Lower abdominal pain discomfort I doubt any type of tumor, she is up-to-date on colonoscopy, her father was recently diagnosed with colon cancer severe for she is at a higher risk category-patient advised use stool softener on a regular basis  I encouraged patient to see her gynecologist and get a pelvic exam and ultrasound to make sure her ovaries are normal size I also recommend she follows up regarding her uterus  Hyperlipidemia check lab work await results  History vitamin D deficiency check lab work await results  We will go ahead and send referral to [facility] for them to see her after the turn of the year and set her up for colonoscopy by March that will be 5 years from previous colonoscopy because of family history she is due

## 2017-10-02 ENCOUNTER — Other Ambulatory Visit: Payer: Self-pay | Admitting: *Deleted

## 2017-10-02 DIAGNOSIS — Z1211 Encounter for screening for malignant neoplasm of colon: Secondary | ICD-10-CM

## 2017-10-02 LAB — BASIC METABOLIC PANEL
BUN/Creatinine Ratio: 9 (ref 9–23)
BUN: 6 mg/dL (ref 6–24)
CALCIUM: 9.9 mg/dL (ref 8.7–10.2)
CO2: 27 mmol/L (ref 20–29)
CREATININE: 0.66 mg/dL (ref 0.57–1.00)
Chloride: 97 mmol/L (ref 96–106)
GFR calc Af Amer: 122 mL/min/{1.73_m2} (ref >59)
GFR, EST NON AFRICAN AMERICAN: 106 mL/min/{1.73_m2} (ref >59)
Glucose: 91 mg/dL (ref 65–99)
POTASSIUM: 4.5 mmol/L (ref 3.5–5.2)
Sodium: 139 mmol/L (ref 134–144)

## 2017-10-02 LAB — CBC WITH DIFFERENTIAL/PLATELET
Basophils Absolute: 0 10*3/uL (ref 0.0–0.2)
Basos: 1 %
EOS (ABSOLUTE): 0.1 10*3/uL (ref 0.0–0.4)
EOS: 1 %
HEMATOCRIT: 39.5 % (ref 34.0–46.6)
Hemoglobin: 13.2 g/dL (ref 11.1–15.9)
Immature Grans (Abs): 0 10*3/uL (ref 0.0–0.1)
Immature Granulocytes: 0 %
Lymphocytes Absolute: 1.8 10*3/uL (ref 0.7–3.1)
Lymphs: 35 %
MCH: 30.4 pg (ref 26.6–33.0)
MCHC: 33.4 g/dL (ref 31.5–35.7)
MCV: 91 fL (ref 79–97)
Monocytes Absolute: 0.4 10*3/uL (ref 0.1–0.9)
Monocytes: 7 %
Neutrophils Absolute: 3 10*3/uL (ref 1.4–7.0)
Neutrophils: 56 %
Platelets: 336 10*3/uL (ref 150–379)
RBC: 4.34 x10E6/uL (ref 3.77–5.28)
RDW: 14.2 % (ref 12.3–15.4)
WBC: 5.2 10*3/uL (ref 3.4–10.8)

## 2017-10-02 LAB — HEPATIC FUNCTION PANEL
ALT: 13 IU/L (ref 0–32)
AST: 19 IU/L (ref 0–40)
Albumin: 4.5 g/dL (ref 3.5–5.5)
Alkaline Phosphatase: 53 IU/L (ref 39–117)
Bilirubin Total: 0.4 mg/dL (ref 0.0–1.2)
Bilirubin, Direct: 0.11 mg/dL (ref 0.00–0.40)
TOTAL PROTEIN: 7.5 g/dL (ref 6.0–8.5)

## 2017-10-02 LAB — LIPID PANEL
CHOL/HDL RATIO: 3.4 ratio (ref 0.0–4.4)
Cholesterol, Total: 200 mg/dL — ABNORMAL HIGH (ref 100–199)
HDL: 59 mg/dL (ref >39)
LDL Calculated: 124 mg/dL — ABNORMAL HIGH (ref 0–99)
Triglycerides: 86 mg/dL (ref 0–149)
VLDL Cholesterol Cal: 17 mg/dL (ref 5–40)

## 2017-10-02 LAB — VITAMIN D 25 HYDROXY (VIT D DEFICIENCY, FRACTURES): Vit D, 25-Hydroxy: 24.2 ng/mL — ABNORMAL LOW (ref 30.0–100.0)

## 2017-10-03 ENCOUNTER — Encounter: Payer: Self-pay | Admitting: Family Medicine

## 2017-10-06 ENCOUNTER — Encounter (INDEPENDENT_AMBULATORY_CARE_PROVIDER_SITE_OTHER): Payer: Self-pay | Admitting: *Deleted

## 2017-10-29 ENCOUNTER — Telehealth (INDEPENDENT_AMBULATORY_CARE_PROVIDER_SITE_OTHER): Payer: Self-pay | Admitting: *Deleted

## 2017-10-29 NOTE — Telephone Encounter (Signed)
When is patient due for repeat TCS -- last done 01/2013, no recommendation on report or path -- please advise

## 2017-11-02 NOTE — Telephone Encounter (Signed)
Since father had colon carcinoma at late onset she is not deemed to be high risk unless she has other family members. Therefore she could wait until 2024 for her next colonoscopy unless she develops symptoms to warrant earlier examination. Please call patient.

## 2017-11-04 NOTE — Telephone Encounter (Signed)
Patient aware, TCS on recall for 01/2023

## 2018-05-01 DIAGNOSIS — R87618 Other abnormal cytological findings on specimens from cervix uteri: Secondary | ICD-10-CM | POA: Diagnosis not present

## 2018-05-01 DIAGNOSIS — Z01419 Encounter for gynecological examination (general) (routine) without abnormal findings: Secondary | ICD-10-CM | POA: Diagnosis not present

## 2018-06-01 ENCOUNTER — Encounter: Payer: Self-pay | Admitting: Family Medicine

## 2018-06-01 ENCOUNTER — Ambulatory Visit: Payer: 59 | Admitting: Family Medicine

## 2018-06-01 VITALS — BP 122/70 | Temp 99.8°F | Ht 59.0 in | Wt 172.0 lb

## 2018-06-01 DIAGNOSIS — B349 Viral infection, unspecified: Secondary | ICD-10-CM | POA: Diagnosis not present

## 2018-06-01 DIAGNOSIS — B348 Other viral infections of unspecified site: Secondary | ICD-10-CM | POA: Diagnosis not present

## 2018-06-01 MED ORDER — AZITHROMYCIN 250 MG PO TABS: ORAL_TABLET | ORAL | 0 refills | Status: DC

## 2018-06-01 MED ORDER — ALBUTEROL SULFATE HFA 108 (90 BASE) MCG/ACT IN AERS: 2.0000 | INHALATION_SPRAY | Freq: Four times a day (QID) | RESPIRATORY_TRACT | 0 refills | Status: DC | PRN

## 2018-06-01 NOTE — Progress Notes (Signed)
   Subjective:    Patient ID: Brittney [name], female    DOB: 02-Jun-1971, 47 y.o.   MRN: [id number]  Sinusitis  This is a new problem. Episode onset: 3 days. Associated symptoms include congestion, coughing, ear pain, headaches and a sore throat. Pertinent negatives include no shortness of breath. (Abdominal pain, fever, vomiting) Treatments tried: dayquil, tylenol. alka seltzer.    Cough congestion drainage denies any severe sinus pain denies wheezing difficulty breathing did have fever over the weekend with some body aches husband had similar symptoms 2 days prior plus multiple people on a cruise had the symptoms that they just got off of  Review of Systems  Constitutional: Negative for activity change and fever.  HENT: Positive for congestion, ear pain, rhinorrhea and sore throat.   Eyes: Negative for discharge.  Respiratory: Positive for cough. Negative for shortness of breath and wheezing.   Cardiovascular: Negative for chest pain.  Neurological: Positive for headaches.       Objective:   Physical Exam  Constitutional: She appears well-developed.  HENT:  Head: Normocephalic.  Nose: Nose normal.  Mouth/Throat: Oropharynx is clear and moist. No oropharyngeal exudate.  Neck: Neck supple.  Cardiovascular: Normal rate and normal heart sounds.  No murmur heard. Pulmonary/Chest: Effort normal and breath sounds normal. She has no wheezes.  Lymphadenopathy:    She has no cervical adenopathy.  Skin: Skin is warm and dry.  Nursing note and vitals reviewed.         Assessment & Plan:  Viral syndrome Possible parainfluenza No need for antibiotics currently If not improving over the course of the next week or getting worse would recommend consideration of Z-Pak Currently does not need albuterol Prescription was given just in case the low doing well has else sick at home okay so using this

## 2018-06-01 NOTE — Patient Instructions (Signed)
If worsening issues or greater problems please feel free to call we will be happy to recheck

## 2018-06-25 ENCOUNTER — Encounter: Payer: Self-pay | Admitting: Family Medicine

## 2018-06-25 ENCOUNTER — Other Ambulatory Visit: Payer: Self-pay | Admitting: Obstetrics and Gynecology

## 2018-06-25 DIAGNOSIS — Z1231 Encounter for screening mammogram for malignant neoplasm of breast: Secondary | ICD-10-CM

## 2018-07-14 ENCOUNTER — Encounter: Payer: Self-pay | Admitting: Family Medicine

## 2018-07-14 ENCOUNTER — Other Ambulatory Visit: Payer: Self-pay | Admitting: Family Medicine

## 2018-07-14 ENCOUNTER — Telehealth: Payer: Self-pay | Admitting: Family Medicine

## 2018-07-14 DIAGNOSIS — R430 Anosmia: Secondary | ICD-10-CM

## 2018-07-14 DIAGNOSIS — R432 Parageusia: Secondary | ICD-10-CM

## 2018-07-14 NOTE — Telephone Encounter (Signed)
Referral placed and pt notified

## 2018-07-14 NOTE — Telephone Encounter (Signed)
Please go ahead with referral to ENT Dr. Benjamine Mola, reason-loss of sense of taste and smell

## 2018-07-21 ENCOUNTER — Encounter: Payer: Self-pay | Admitting: Family Medicine

## 2018-07-21 ENCOUNTER — Ambulatory Visit: Payer: 59 | Admitting: Family Medicine

## 2018-07-21 VITALS — BP 124/82 | Ht 59.0 in | Wt 171.0 lb

## 2018-07-21 DIAGNOSIS — R413 Other amnesia: Secondary | ICD-10-CM

## 2018-07-21 DIAGNOSIS — G5623 Lesion of ulnar nerve, bilateral upper limbs: Secondary | ICD-10-CM

## 2018-07-21 DIAGNOSIS — R410 Disorientation, unspecified: Secondary | ICD-10-CM

## 2018-07-21 DIAGNOSIS — R531 Weakness: Secondary | ICD-10-CM | POA: Diagnosis not present

## 2018-07-21 NOTE — Progress Notes (Signed)
Subjective:    Patient ID: Brittney [name], female    DOB: May 03, 1971, 47 y.o.   MRN: [id number]  HPI Patient states she has had some bilateral hand numbness. Left arm feels heavy and numb,left leg numbness and bilateral foot/toes goes numb.This has been ongoing for the last year.Also states at the top of her spine it feels like it is rubbing on something. Patient relates some stiffness in the upper neck and also in the lower back at times it feels like there is a popping She also states her left arm at times is a little weak and clumsy She also gets numbness bilateral arms that is consistent with ulnar nerve impingement She also relates that there is been times where she has difficult time thinking difficult time remembering in addition to that difficult time processing states one time she got lost at her own office unable to know which elevator to use or what floor to go to for at least 15 to 20 minutes before her memory came back around this is been going on over the past year and seems to be progressive family is even main mention of it  She is also having left leg numbness that comes and goes for no apparent reason at times her left leg gets weak and stumbled but she denies any sciatica pain she denies any injury denies being depressed states this is never happened to her before until the past year  She also states her husband states that time she repeats herself and sometimes repeats questions she is asked just recently   Review of Systems  Constitutional: Negative for activity change, appetite change and fatigue.  HENT: Negative for congestion and rhinorrhea.   Respiratory: Negative for cough and shortness of breath.   Cardiovascular: Negative for chest pain and leg swelling.  Gastrointestinal: Negative for abdominal pain and diarrhea.  Endocrine: Negative for polydipsia and polyphagia.  Skin: Negative for color change.  Neurological: Negative for dizziness and weakness.    Psychiatric/Behavioral: Negative for behavioral problems and confusion.       Objective:   Physical Exam  Constitutional: She appears well-nourished. No distress.  HENT:  Head: Normocephalic and atraumatic.  Eyes: Right eye exhibits no discharge. Left eye exhibits no discharge.  Neck: No tracheal deviation present.  Cardiovascular: Normal rate, regular rhythm and normal heart sounds.  No murmur heard. Pulmonary/Chest: Effort normal and breath sounds normal. No respiratory distress.  Musculoskeletal: She exhibits no edema.  Lymphadenopathy:    She has no cervical adenopathy.  Neurological: She is alert. Coordination normal.  Skin: Skin is warm and dry.  Psychiatric: She has a normal mood and affect. Her behavior is normal.  Vitals reviewed.  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 neurology for further evaluation more than likely there is need to do a nerve conduction study  Cognitive changes along with forgetfulness plus possible TIA-also patient having unexplained  intermittent numbness of the left leg and intermittent left leg weakness I believe the patient would benefit from a MRI of the brain to rule out mini strokes to rule out ischemic disease as well as rule out multiple sclerosis  If the MRI of the brain does not explain these then potentially the patient will need to have MRI of the cervical spine or lumbar spine but we will leave that up to  neurology  Essentially referring to neurology for further evaluation of the cognitive issues as well as some numbness issues

## 2018-07-22 ENCOUNTER — Encounter: Payer: Self-pay | Admitting: Neurology

## 2018-07-22 ENCOUNTER — Encounter: Payer: Self-pay | Admitting: Family Medicine

## 2018-07-24 ENCOUNTER — Ambulatory Visit
Admission: RE | Admit: 2018-07-24 | Discharge: 2018-07-24 | Disposition: A | Discharge status: Home / Self Care | Payer: 59 | Source: Ambulatory Visit | Attending: Obstetrics and Gynecology | Admitting: Obstetrics and Gynecology

## 2018-07-24 DIAGNOSIS — Z1231 Encounter for screening mammogram for malignant neoplasm of breast: Secondary | ICD-10-CM

## 2018-07-27 ENCOUNTER — Other Ambulatory Visit: Payer: Self-pay | Admitting: Family Medicine

## 2018-07-27 MED ORDER — LORAZEPAM 1 MG PO TABS: ORAL_TABLET | ORAL | 0 refills | Status: DC

## 2018-07-27 NOTE — Progress Notes (Unsigned)
ativan

## 2018-07-27 NOTE — Telephone Encounter (Signed)
I recommend that this patient take 1 mg of Ativan approximately 45 minutes before her MRI.  This will cause mild sedation.  She should not drive on the day she takes that medicine.  It is fine to send in 3 tablets to her pharmacy.  Please notify patient accordingly.

## 2018-07-30 ENCOUNTER — Ambulatory Visit (HOSPITAL_COMMUNITY)
Admission: RE | Admit: 2018-07-30 | Discharge: 2018-07-30 | Disposition: A | Discharge status: Home / Self Care | Payer: 59 | Source: Ambulatory Visit | Attending: Family Medicine | Admitting: Family Medicine

## 2018-07-30 DIAGNOSIS — R531 Weakness: Secondary | ICD-10-CM | POA: Diagnosis not present

## 2018-07-30 DIAGNOSIS — R410 Disorientation, unspecified: Secondary | ICD-10-CM | POA: Diagnosis not present

## 2018-07-30 DIAGNOSIS — R9089 Other abnormal findings on diagnostic imaging of central nervous system: Secondary | ICD-10-CM | POA: Diagnosis not present

## 2018-07-30 DIAGNOSIS — R413 Other amnesia: Secondary | ICD-10-CM | POA: Diagnosis not present

## 2018-07-31 ENCOUNTER — Other Ambulatory Visit: Payer: Self-pay

## 2018-07-31 DIAGNOSIS — R5383 Other fatigue: Secondary | ICD-10-CM

## 2018-08-12 DIAGNOSIS — R5383 Other fatigue: Secondary | ICD-10-CM | POA: Diagnosis not present

## 2018-08-13 LAB — TSH: TSH: 1.45 u[IU]/mL (ref 0.450–4.500)

## 2018-08-13 LAB — CORTISOL: CORTISOL: 12.5 ug/dL

## 2018-08-13 LAB — T4, FREE: Free T4: 1.2 ng/dL (ref 0.82–1.77)

## 2018-08-20 ENCOUNTER — Ambulatory Visit (INDEPENDENT_AMBULATORY_CARE_PROVIDER_SITE_OTHER): Payer: 59 | Admitting: Otolaryngology

## 2018-08-20 DIAGNOSIS — J343 Hypertrophy of nasal turbinates: Secondary | ICD-10-CM

## 2018-08-20 DIAGNOSIS — J310 Chronic rhinitis: Secondary | ICD-10-CM

## 2018-08-20 DIAGNOSIS — R430 Anosmia: Secondary | ICD-10-CM

## 2018-08-20 DIAGNOSIS — R432 Parageusia: Secondary | ICD-10-CM

## 2018-09-09 ENCOUNTER — Other Ambulatory Visit: Payer: Self-pay

## 2018-09-09 ENCOUNTER — Encounter: Payer: Self-pay | Admitting: Neurology

## 2018-09-09 ENCOUNTER — Other Ambulatory Visit (INDEPENDENT_AMBULATORY_CARE_PROVIDER_SITE_OTHER): Payer: 59

## 2018-09-09 ENCOUNTER — Ambulatory Visit: Payer: 59 | Admitting: Neurology

## 2018-09-09 VITALS — BP 132/76 | HR 88 | Ht 59.0 in | Wt 172.0 lb

## 2018-09-09 DIAGNOSIS — R200 Anesthesia of skin: Secondary | ICD-10-CM

## 2018-09-09 DIAGNOSIS — R413 Other amnesia: Secondary | ICD-10-CM

## 2018-09-09 DIAGNOSIS — G5623 Lesion of ulnar nerve, bilateral upper limbs: Secondary | ICD-10-CM

## 2018-09-09 DIAGNOSIS — R202 Paresthesia of skin: Secondary | ICD-10-CM

## 2018-09-09 DIAGNOSIS — G629 Polyneuropathy, unspecified: Secondary | ICD-10-CM

## 2018-09-09 NOTE — Patient Instructions (Addendum)
1. Bloodwork for B12, B1, ESR, CRP, copper, SPEP, IFE, folate, ANA, SS-A, SS-B  Your provider requests that you have LABS drawn today.  We share a lab with [facility] - they are located in [address] (second floor) of this building.  Once you get there, please have a seat and the phlebotomist will call your name.  If you have waited more than 15 minutes, please advise the front desk  2. Schedule EMG/NCV of both UE and left LE with Dr. Posey Pronto 3. Schedule routine EEG 4. Follow-up after tests, call for any changes

## 2018-09-09 NOTE — Progress Notes (Signed)
NEUROLOGY CONSULTATION NOTE  Brittney [name] MRN: [id number] DOB: 1971-07-13  Referring provider: Dr. Sallee Lange Primary care provider: Dr. Sallee Lange  Reason for consult:  Confusion, numbness/tingling/weakness  Dear Dr Brittney [name]:  Thank you for your kind referral of Brittney [name] for consultation of the above symptoms. Although her history is well known to you, please allow me to reiterate it for the purpose of our medical record. She is alone in the office today. Records and images were personally reviewed where available.  HISTORY OF PRESENT ILLNESS: This is a pleasant 47 year old right-handed woman with a history of hypertension, presenting for evaluation of paresthesias and memory loss/confusion. She reports paresthesias started around a year ago, worse in the past 2 months. Symptoms initially started in her left leg, she has numbness occurring on a daily basis, but not all day. Numbness is worse when lying down on her left side. She has numbness and tingling, some burning, in both feet. She started noticing similar symptoms in both hands around 3 months ago, worse on the left hand. Symptoms are from the elbow down to the last 2 digits of both hands. She has also noticed weakness in toes of her left foot when trying to grasp something. She has noticed that she has to grab things several times with her hands to pick them up. She feels a wringing sensation in the bones of her neck and lower back, neck feels stiff but no pain. She has occasional low back pain. 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 She has also noticed confusion and memory loss in general that started a year ago as well. She would forget what she went to get in a room, or at work she would walk to a doctor for a phone call and forget the caller's name. She forgets when she puts her kids on time out. Her husband took over bills several years ago because she would forget them. She reports an incident 10 years  ago at work, she went downstairs then could not remember what floor she worked on. She could not recognize the floor, it took her 15 minutes to find her desk. She does not recall having any headache at that time. She denies getting lost driving regular routes. She occasionally forgets her medications. Her father, maternal aunts and cousins have memory issues. She denies any history of significant head injuries, she drinks alcohol socially.   She has had more headaches than usual, with around 2-3 a month, usually one prior to her menstrual period. Pain is over the maxillary sinuses and occipital regions, she takes Tylenol with good response. She was diagnosed with vertigo 2 years ago, she stays dizzy/lightheaded, worse when working out or when going off ramp on a highway. There is mild nausea with this. She denies any diplopia but vision has worsened the past few months with a floater in her right eye. She has constipation. No dysarthria/dysphagia, bowel/bladder dysfunction. Her husband has mentioned that she would stare off sometimes, but responds when called. A couple of times driving, she would have to think of where she is. She denies any olfactory/gustatory hallucinations. She notices occasional left leg jerking.   I personally reviewed MRI brain without contrast done 07/30/2018 which did not show any acute changes, no significant white matter disease. There was note of a partially empty sella, she does not have any symptoms suggestive of IIH.  PAST MEDICAL HISTORY: Past Medical History:  Diagnosis Date  .  Acid reflux   . Chest pain    intermittent x 1 yr  . Diabetic mellitus, gestational   . Hypertension    during pregnancy. Not treated for it now.  . IBS (irritable bowel syndrome)    dx in 2005 by primary MD  . IBS (irritable bowel syndrome)   . Obesity   . Seasonal allergies    yr roud; was immunothrapy in childhood, does not need any more at this time    PAST SURGICAL HISTORY: Past  Surgical History:  Procedure Laterality Date  . CESAREAN SECTION W/BTL  2007  . cholectystectomy  2000  . CHOLECYSTECTOMY  2000  . COLONOSCOPY N/A 02/19/2013   Procedure: COLONOSCOPY;  Surgeon: Rogene Houston, MD;  Location: [facility];  Service: Endoscopy;  Laterality: N/A;  830    MEDICATIONS: Current Outpatient Medications on File Prior to Visit  Medication Sig Dispense Refill  . loratadine (CLARITIN) 10 MG tablet Take 10 mg by mouth daily.    Marland Kitchen LORazepam (ATIVAN) 1 MG tablet Take one tablet 45 minutes prior to MRI. Do not drive while taking this med due to sedation. 3 tablet 0  . losartan (COZAAR) 100 MG tablet Take 1 tablet (100 mg total) by mouth daily. 90 tablet 3  . omeprazole (PRILOSEC) 20 MG capsule Take 20 mg by mouth daily.     No current facility-administered medications on file prior to visit.     ALLERGIES: Allergies  Allergen Reactions  . Miralax [Polyethylene Glycol] Swelling    Tongue swelling  . Vasotec [Enalapril]     cough    FAMILY HISTORY: Family History  Problem Relation Age of Onset  . Cancer Father 70       colon cancer    SOCIAL HISTORY: Social History   Socioeconomic History  . Marital status: Married    Spouse name: Not on file  . Number of children: Not on file  . Years of education: Not on file  . Highest education level: Not on file  Occupational History  . Not on file  Social Needs  . Financial resource strain: Not on file  . Food insecurity:    Worry: Not on file    Inability: Not on file  . Transportation needs:    Medical: Not on file    Non-medical: Not on file  Tobacco Use  . Smoking status: Never Smoker  . Smokeless tobacco: Never Used  Substance and Sexual Activity  . Alcohol use: Yes    Comment: occassionally   . Drug use: No  . Sexual activity: Not on file  Lifestyle  . Physical activity:    Days per week: Not on file    Minutes per session: Not on file  . Stress: Not on file  Relationships  . Social  connections:    Talks on phone: Not on file    Gets together: Not on file    Attends religious service: Not on file    Active member of club or organization: Not on file    Attends meetings of clubs or organizations: Not on file    Relationship status: Not on file  . Intimate partner violence:    Fear of current or ex partner: Not on file    Emotionally abused: Not on file    Physically abused: Not on file    Forced sexual activity: Not on file  Other Topics Concern  . Not on file  Social History Narrative   Married 14 years,  2 children ages 39 and 62.    Job: Pt accounting   Gets regular exercise.     REVIEW OF SYSTEMS: Constitutional: No fevers, chills, or sweats, no generalized fatigue, change in appetite Eyes: No visual changes, double vision, eye pain Ear, nose and throat: No hearing loss, ear pain, nasal congestion, sore throat Cardiovascular: No chest pain, palpitations Respiratory:  No shortness of breath at rest or with exertion, wheezes GastrointestinaI: No nausea, vomiting, diarrhea, abdominal pain, fecal incontinence Genitourinary:  No dysuria, urinary retention or frequency Musculoskeletal:  No neck pain, +back pain Integumentary: No rash, pruritus, skin lesions Neurological: as above Psychiatric: No depression, insomnia, anxiety Endocrine: No palpitations, fatigue, diaphoresis, mood swings, change in appetite, change in weight, increased thirst Hematologic/Lymphatic:  No anemia, purpura, petechiae. Allergic/Immunologic: no itchy/runny eyes, nasal congestion, recent allergic reactions, rashes  PHYSICAL EXAM: Vitals:   09/09/18 1400  BP: 132/76  Pulse: 88  SpO2: 100%   General: No acute distress Head:  Normocephalic/atraumatic Eyes: unable to visualize fundi Neck: supple, no paraspinal tenderness, full range of motion Back: No paraspinal tenderness Heart: regular rate and rhythm Lungs: Clear to auscultation bilaterally. Vascular: No carotid  bruits. Skin/Extremities: No rash, no edema Neurological Exam: Mental status: alert and oriented to person, place, and time, no dysarthria or aphasia, Fund of knowledge is appropriate.  Recent and remote memory are intact.  Attention and concentration are normal.    Able to name objects and repeat phrases. Able to name 33 F words in 1 minute (nl > 11). CDT 5/5 MMSE - Mini Mental State Exam 09/09/2018  Orientation to time 4  Orientation to Place 5  Registration 3  Attention/ Calculation 5  Recall 2  Language- name 2 objects 2  Language- repeat 1  Language- follow 3 step command 3  Language- read & follow direction 1  Write a sentence 1  Copy design 1  Total score 28   Cranial nerves: CN I: not tested CN II: pupils slight asymmetry, 2.10mm OD, 71mm OS, reactive to light, visual fields intact, unable to visualize fundi CN III, IV, VI:  full range of motion, no nystagmus, no ptosis CN V: facial sensation intact CN VII: upper and lower face symmetric CN VIII: hearing intact to finger rub CN IX, X: gag intact, uvula midline CN XI: sternocleidomastoid and trapezius muscles intact CN XII: tongue midline Bulk & Tone: normal, no fasciculations. Motor: 5/5 throughout except for mild weakness of left 5th digit intrinsics, good fine finger movements, no pronator drift. Sensation: intact to light touch, cold, pin on both UE, reports increased pin and cold on left LE, decreased vibration to ankles bilaterally. No extinction to double simultaneous stimulation.  Romberg test negative Deep Tendon Reflexes: unable to elicit on both UE, +2 on both LE with distraction, no ankle clonus Plantar responses: downgoing bilaterally Cerebellar: no incoordination on finger to nose testing Gait: narrow-based and steady, able to tandem walk adequately. Tremor: none Negative Tinel sign at the elbow and wrist bilaterally  IMPRESSION: This is a pleasant 47 year old right-handed woman with a history of hypertension,  presenting for evaluation of memory changes, numbness/tingling/weakness of the extremities. She describes symptoms suggestive of bilateral ulnar neuropathy, but also reports paresthesias in both feet, worse on the left leg. Neurological exam shows mild left hand weakness, sensory changes in the left foot and mild length-dependent neuropathy. MOCA score today normal 28/30. Etiology of symptoms unclear, there is no clear unifying diagnosis, MRI brain without contrast normal. We discussed  doing neuropathy labs, EMG/NCV of both upper extremities and left lower extremity to further evaluate her symptoms. She also reports staring episodes per family, routine EEG will be done. We discussed that further recommendations will depend on test results, follow-up after tests.   Thank you for allowing me to participate in the care of this patient. Please do not hesitate to call for any questions or concerns.   Ellouise Newer, M.D.  CC: Dr. Wolfgang [name]

## 2018-09-15 LAB — SJOGREN'S SYNDROME ANTIBODS(SSA + SSB)
SSA (Ro) (ENA) Antibody, IgG: 1.3 AI — AB
SSB (LA) (ENA) ANTIBODY, IGG: NEGATIVE AI

## 2018-09-15 LAB — FOLATE: FOLATE: 17.2 ng/mL

## 2018-09-15 LAB — SEDIMENTATION RATE: SED RATE: 22 mm/h — AB (ref 0–20)

## 2018-09-15 LAB — PROTEIN ELECTROPHORESIS, SERUM
Albumin ELP: 4.2 g/dL (ref 3.8–4.8)
Alpha 1: 0.3 g/dL (ref 0.2–0.3)
Alpha 2: 0.6 g/dL (ref 0.5–0.9)
BETA GLOBULIN: 0.4 g/dL (ref 0.4–0.6)
Beta 2: 0.4 g/dL (ref 0.2–0.5)
Gamma Globulin: 1 g/dL (ref 0.8–1.7)
TOTAL PROTEIN: 7 g/dL (ref 6.1–8.1)

## 2018-09-15 LAB — IMMUNOFIXATION ELECTROPHORESIS
IGG (IMMUNOGLOBIN G), SERUM: 1181 mg/dL (ref 600–1640)
IGM, SERUM: 39 mg/dL — AB (ref 50–300)
IMMUNOFIX ELECTR INT: NOT DETECTED
IMMUNOGLOBULIN A: 323 mg/dL — AB (ref 47–310)

## 2018-09-15 LAB — VITAMIN B12: VITAMIN B 12: 503 pg/mL (ref 200–1100)

## 2018-09-15 LAB — C-REACTIVE PROTEIN: CRP: 6.8 mg/L (ref <8.0)

## 2018-09-15 LAB — TIQ-NTM

## 2018-09-15 LAB — VITAMIN B1: Vitamin B1 (Thiamine): 18 nmol/L (ref 8–30)

## 2018-09-15 LAB — ANTI-NUCLEAR AB-TITER (ANA TITER)

## 2018-09-15 LAB — ANA: Anti Nuclear Antibody(ANA): POSITIVE — AB

## 2018-09-17 ENCOUNTER — Ambulatory Visit (INDEPENDENT_AMBULATORY_CARE_PROVIDER_SITE_OTHER): Payer: 59 | Admitting: Neurology

## 2018-09-17 DIAGNOSIS — R413 Other amnesia: Secondary | ICD-10-CM

## 2018-09-17 DIAGNOSIS — G5623 Lesion of ulnar nerve, bilateral upper limbs: Secondary | ICD-10-CM | POA: Diagnosis not present

## 2018-09-17 DIAGNOSIS — R202 Paresthesia of skin: Secondary | ICD-10-CM

## 2018-09-17 DIAGNOSIS — G629 Polyneuropathy, unspecified: Secondary | ICD-10-CM

## 2018-09-17 DIAGNOSIS — R200 Anesthesia of skin: Secondary | ICD-10-CM | POA: Diagnosis not present

## 2018-09-22 DIAGNOSIS — H43391 Other vitreous opacities, right eye: Secondary | ICD-10-CM | POA: Diagnosis not present

## 2018-09-24 ENCOUNTER — Ambulatory Visit (INDEPENDENT_AMBULATORY_CARE_PROVIDER_SITE_OTHER): Payer: 59 | Admitting: Neurology

## 2018-09-24 DIAGNOSIS — G5623 Lesion of ulnar nerve, bilateral upper limbs: Secondary | ICD-10-CM

## 2018-09-24 DIAGNOSIS — G629 Polyneuropathy, unspecified: Secondary | ICD-10-CM | POA: Diagnosis not present

## 2018-09-24 DIAGNOSIS — R413 Other amnesia: Secondary | ICD-10-CM

## 2018-09-24 DIAGNOSIS — R202 Paresthesia of skin: Secondary | ICD-10-CM

## 2018-09-24 DIAGNOSIS — R200 Anesthesia of skin: Secondary | ICD-10-CM

## 2018-09-24 NOTE — Procedures (Signed)
[facility] Neurology  [address], [address]  [place], [place] [address] Tel: [phone] Fax:  [phone] Test Date:  09/24/2018  Patient: Brittney [name] DOB: 10/17/1971 Physician: Narda Amber, DO  Sex: Female Height: 4\' 11"  Ref Phys: Ellouise Newer, MD  ID#: [id number] Temp: 34.3C Technician:    Patient Complaints: This is a 47 year old female referred for evaluation of generalized numbness and tingling, worse in the left leg.  NCV & EMG Findings: Extensive electrodiagnostic testing of the left lower extremity and additional studies of the right shows:  1. Bilateral sural and superficial peroneal sensory responses are absent. 2. Bilateral peroneal motor responses are absent at the extensor digitorum brevis, and normal at the tibialis anterior.  Bilateral tibial motor responses show reduced amplitude (L3.8, R3.7 mV) and decreased conduction velocity (Knee-Ankle, L29, R24 m/s).   3. Left tibial H reflex study shows prolonged latency.   4. Chronic motor axonal loss changes are isolated to bilateral tibialis anterior muscles, without accompanied active denervation.  In particular, there are no fibrillation potentials in the lumbar paraspinal muscles.  Impression: The electrophysiologic findings are most consistent with a chronic sensorimotor polyneuropathy, axonal loss and demyelinating and type, affecting bilateral lower extremities.   ___________________________ Narda Amber, DO    Nerve Conduction Studies Anti Sensory Summary Table   Site NR Peak (ms) Norm Peak (ms) P-T Amp (V) Norm P-T Amp  Left Sup Peroneal Anti Sensory (Ant Lat Mall)  34.3C  12 cm NR  <4.5  >5  Right Sup Peroneal Anti Sensory (Ant Lat Mall)  34.3C  12 cm NR  <4.5  >5  Left Sural Anti Sensory (Lat Mall)  34.3C  Calf NR  <4.5  >5  Right Sural Anti Sensory (Lat Mall)  34.3C  Calf NR  <4.5  >5   Motor Summary Table   Site NR Onset (ms) Norm Onset (ms) O-P Amp (mV) Norm O-P Amp Site1 Site2  Delta-0 (ms) Dist (cm) Vel (m/s) Norm Vel (m/s)  Left Peroneal Motor (Ext Dig Brev)  34.3C  Ankle NR  <5.5  >3 B Fib Ankle  0.0  >40  B Fib NR     Poplt B Fib  0.0  >40  Poplt NR            Right Peroneal Motor (Ext Dig Brev)  34.3C  Ankle NR  <5.5  >3 B Fib Ankle  0.0  >40  B Fib NR     Poplt B Fib  0.0  >40  Poplt NR            Left Peroneal TA Motor (Tib Ant)  34.3C  Fib Head    3.8 <4.0 7.3 >4 Poplit Fib Head 1.8 8.0 44 >40  Poplit    5.6  7.3         Right Peroneal TA Motor (Tib Ant)  34.3C  Fib Head    3.0 <4.0 6.7 >4 Poplit Fib Head 1.8 8.0 44 >40  Poplit    4.8  6.7         Left Tibial Motor (Abd Hall Brev)  34.3C  Ankle    4.5 <6.0 3.8 >8 Knee Ankle 11.6 34.0 29 >40  Knee    16.1  2.8         Right Tibial Motor (Abd Hall Brev)  34.3C  Ankle    3.4 <6.0 3.7 >8 Knee Ankle 13.5 32.0 24 >40  Knee    16.9  1.0  H Reflex Studies   NR H-Lat (ms) Lat Norm (ms) L-R H-Lat (ms)  Left Tibial (Gastroc)  34.3C     39.32 <35    EMG   Side Muscle Ins Act Fibs Psw Fasc Number Recrt Dur Dur. Amp Amp. Poly Poly. Comment  Left AntTibialis Nml Nml Nml Nml 1- Rapid Some 1+ Some 1+ Some 1+ N/A  Left Gastroc Nml Nml Nml Nml Nml Nml Nml Nml Nml Nml Nml Nml N/A  Left Flex Dig Long Nml Nml Nml Nml Nml Nml Nml Nml Nml Nml Nml Nml N/A  Left RectFemoris Nml Nml Nml Nml Nml Nml Nml Nml Nml Nml Nml Nml N/A  Left GluteusMed Nml Nml Nml Nml Nml Nml Nml Nml Nml Nml Nml Nml N/A  Right AntTibialis Nml Nml Nml Nml 1- Rapid Some 1+ Some 1+ Some 1+ N/A  Right Gastroc Nml Nml Nml Nml Nml Nml Nml Nml Nml Nml Nml Nml N/A  Right GluteusMed Nml Nml Nml Nml Nml Nml Nml Nml Nml Nml Nml Nml N/A  Right Lumbo Parasp Low Nml Nml Nml Nml NE - - - - - - - N/A      Waveforms:

## 2018-09-28 ENCOUNTER — Telehealth: Payer: Self-pay | Admitting: Neurology

## 2018-09-28 NOTE — Telephone Encounter (Signed)
Patient called with some questions regarding test results. Please Call. Thanks

## 2018-09-28 NOTE — Procedures (Signed)
ELECTROENCEPHALOGRAM REPORT  Date of Study: 09/17/2018  Patient's Name: Brittney [name] MRN: [id number] Date of Birth: 03/08/1971  Referring Provider: Dr. Ellouise Newer  Clinical History: This is a 47 year old woman with report of staring episodes and memory loss.   Medications: Claritin, Ativan, Cozaar, Prilosec  Technical Summary: A multichannel digital EEG recording measured by the international 10-20 system with electrodes applied with paste and impedances below 5000 ohms performed in our laboratory with EKG monitoring in an awake and drowsy patient.  Hyperventilation and photic stimulation were performed.  The digital EEG was referentially recorded, reformatted, and digitally filtered in a variety of bipolar and referential montages for optimal display.    Description: The patient is awake and drowsy during the recording.  During maximal wakefulness, there is a symmetric, medium voltage 11 Hz posterior dominant rhythm that attenuates with eye opening.  The record is symmetric.  During drowsiness, there is an increase in theta slowing of the background. Deeper stages of sleep were not seen. Hyperventilation and photic stimulation did not elicit any abnormalities.  There were no epileptiform discharges or electrographic seizures seen.    EKG lead was unremarkable.  Impression: This awake and drowsy EEG is normal.    Clinical Correlation: A normal EEG does not exclude a clinical diagnosis of epilepsy.  If further clinical questions remain, prolonged EEG may be helpful.  Clinical correlation is advised.   Ellouise Newer, M.D.

## 2018-10-06 ENCOUNTER — Ambulatory Visit (INDEPENDENT_AMBULATORY_CARE_PROVIDER_SITE_OTHER): Payer: 59 | Admitting: Neurology

## 2018-10-06 DIAGNOSIS — R202 Paresthesia of skin: Secondary | ICD-10-CM

## 2018-10-06 DIAGNOSIS — R200 Anesthesia of skin: Secondary | ICD-10-CM

## 2018-10-06 DIAGNOSIS — G5623 Lesion of ulnar nerve, bilateral upper limbs: Secondary | ICD-10-CM

## 2018-10-06 DIAGNOSIS — G629 Polyneuropathy, unspecified: Secondary | ICD-10-CM

## 2018-10-06 DIAGNOSIS — R413 Other amnesia: Secondary | ICD-10-CM

## 2018-10-06 NOTE — Procedures (Signed)
[facility] Neurology  [address], [address]  [place], [place] [address] Tel: [phone] Fax:  [phone] Test Date:  10/06/2018  Patient: Brittney [name] DOB: Feb 04, 1971 Physician: Narda Amber, DO  Sex: Female Height: 4\' 11"  Ref Phys: Ellouise Newer, MD  ID#: [id number] Temp: 36.0C Technician:    Patient Complaints: This is a 47 year old female referred for evaluation of generalized numbness and tingling of the arms and legs.  NCV & EMG Findings: Extensive electrodiagnostic testing of the right upper extremity and additional studies of the left shows:  1. All sensory responses in the upper extremities are absent including bilateral median, bilateral ulnar, and left radial nerves, except right radial sensory response which is prolonged (3.8 ms) and has reduced amplitude (8.7 V).   2. Bilateral median motor responses show severely prolonged latency (L7.0, R7.0 ms) and decreased conduction velocity (Elbow-Wrist, L37, R38 m/s).  Bilateral ulnar motor responses also show prolonged latency (L4.5, R3.9 ms), reduced amplitude (L6.7, R5.2 mV), decreased conduction velocity across the forearm and elbow (25 - 38 m/s).  There is no temporal dispersion or conduction block. 3. Ulnar F wave response is prolonged on the right and absent on the left. 4. Chronic motor axonal loss changes are seen affecting bilateral first dorsal interosseous and abductor pollicis brevis muscles.  There is no evidence of active denervation involving any of the tested muscles, including the cervical paraspinal muscles.  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   ___________________________ Narda Amber, DO    Nerve Conduction Studies Anti Sensory Summary Table   Site NR Peak (ms) Norm Peak (ms) P-T Amp (V) Norm P-T Amp  Left Median Anti Sensory (2nd Digit)  36C  Wrist NR  <3.4  >20    Right Median Anti Sensory (2nd Digit)  36C  Wrist NR  <3.4  >20  Left Radial Anti Sensory (Base 1st Digit)  36C  Wrist NR  <2.7  >18  Right Radial Anti Sensory (Base 1st Digit)  36C  Wrist    3.8 <2.7 8.7 >18  Left Ulnar Anti Sensory (5th Digit)  36C  Wrist NR  <3.1  >12  Right Ulnar Anti Sensory (5th Digit)  36C  Wrist NR  <3.1  >12   Motor Summary Table   Site NR Onset (ms) Norm Onset (ms) O-P Amp (mV) Norm O-P Amp Site1 Site2 Delta-0 (ms) Dist (cm) Vel (m/s) Norm Vel (m/s)  Left Median Motor (Abd Poll Brev)  36C  Wrist    7.0 <3.9 6.3 >6 Elbow Wrist 7.0 26.0 37 >50  Elbow    14.0  5.8         Right Median Motor (Abd Poll Brev)  36C  Wrist    7.0 <3.9 6.6 >6 Elbow Wrist 7.1 27.0 38 >50  Elbow    14.1  5.9         Left Ulnar Motor (Abd Dig Minimi)  36C  Wrist    4.5 <3.1 6.7 >7 B Elbow Wrist 6.1 21.0 34 >50  B Elbow    10.6  6.0  A Elbow B Elbow 3.3 10.0 30 >50  A Elbow    13.9  5.6         Right Ulnar Motor (Abd Dig Minimi)  36C  Wrist    3.9 <3.1 5.2 >7 B Elbow Wrist 5.8 22.0 38 >50  B Elbow    9.7  5.2  A Elbow  B Elbow 4.0 10.0 25 >50  A Elbow    13.7  5.2          F Wave Studies   NR F-Lat (ms) Lat Norm (ms) L-R F-Lat (ms)  Left Ulnar (Mrkrs) (Abd Dig Min)  36C  NR  <33   Right Ulnar (Mrkrs) (Abd Dig Min)  36C     40.50 <33    EMG   Side Muscle Ins Act Fibs Psw Fasc Number Recrt Dur Dur. Amp Amp. Poly Poly. Comment  Right 1stDorInt Nml Nml Nml Nml 1- Rapid Some 1+ Some 1+ Some 1+ N/A  Right Abd Poll Brev Nml Nml Nml Nml 1- Rapid Some 1+ Some 1+ Some 1+ N/A  Right Ext Indicis Nml Nml Nml Nml Nml Nml Nml Nml Nml Nml Nml Nml N/A  Right PronatorTeres Nml Nml Nml Nml Nml Nml Nml Nml Nml Nml Nml Nml N/A  Right Biceps Nml Nml Nml Nml Nml Nml Nml Nml Nml Nml Nml Nml N/A  Right Deltoid Nml Nml Nml Nml Nml Nml Nml Nml Nml Nml Nml Nml N/A  Right Triceps Nml Nml Nml Nml Nml Nml Nml Nml Nml Nml Nml Nml N/A  Right Cervical Parasp Low Nml Nml Nml Nml Nml Nml Nml Nml  Nml Nml Nml Nml N/A  Left 1stDorInt Nml Nml Nml Nml 1- Rapid Some 1+ Some 1+ Some 1+ N/A  Left Abd Poll Brev Nml Nml Nml Nml 1- Rapid Some 1+ Some 1+ Some 1+ N/A  Left Ext Indicis Nml Nml Nml Nml Nml Nml Nml Nml Nml Nml Nml Nml N/A  Left PronatorTeres Nml Nml Nml Nml Nml Nml Nml Nml Nml Nml Nml Nml N/A  Left Biceps Nml Nml Nml Nml Nml Nml Nml Nml Nml Nml Nml Nml N/A  Left Triceps Nml Nml Nml Nml Nml Nml Nml Nml Nml Nml Nml Nml N/A  Left Deltoid Nml Nml Nml Nml Nml Nml Nml Nml Nml Nml Nml Nml N/A      Waveforms:

## 2018-10-07 ENCOUNTER — Telehealth: Payer: Self-pay | Admitting: Neurology

## 2018-10-07 NOTE — Telephone Encounter (Signed)
Spoke to patient about EMG results, recommending a spinal tap as next step. Patient is agreeable. After spinal tap, follow-up with Dr. Posey Pronto.  Brittney [name], pls order the following: 1. LP under fluoro send for - CSF cell count and diff - CSF protein, glucose - CSF IgG index -CSF oligoclonal bands - CSF myelin basic protein -CSF flow cytology - CSF ACE  2. She has not heard back yet from Rheum, when you call her about the spinal tap, pls give her number of Rheum so she can make appt. Thanks!

## 2018-10-08 ENCOUNTER — Other Ambulatory Visit: Payer: Self-pay

## 2018-10-08 DIAGNOSIS — G5623 Lesion of ulnar nerve, bilateral upper limbs: Secondary | ICD-10-CM

## 2018-10-08 DIAGNOSIS — R200 Anesthesia of skin: Secondary | ICD-10-CM

## 2018-10-08 DIAGNOSIS — G629 Polyneuropathy, unspecified: Secondary | ICD-10-CM

## 2018-10-08 DIAGNOSIS — R202 Paresthesia of skin: Secondary | ICD-10-CM

## 2018-10-13 ENCOUNTER — Ambulatory Visit
Admission: RE | Admit: 2018-10-13 | Discharge: 2018-10-13 | Disposition: A | Discharge status: Home / Self Care | Payer: 59 | Source: Ambulatory Visit | Attending: Neurology | Admitting: Neurology

## 2018-10-13 DIAGNOSIS — G5623 Lesion of ulnar nerve, bilateral upper limbs: Secondary | ICD-10-CM

## 2018-10-13 DIAGNOSIS — G629 Polyneuropathy, unspecified: Secondary | ICD-10-CM

## 2018-10-13 DIAGNOSIS — R200 Anesthesia of skin: Secondary | ICD-10-CM

## 2018-10-13 DIAGNOSIS — R202 Paresthesia of skin: Secondary | ICD-10-CM

## 2018-10-13 DIAGNOSIS — G6289 Other specified polyneuropathies: Secondary | ICD-10-CM | POA: Diagnosis not present

## 2018-10-13 NOTE — Progress Notes (Signed)
Blood obtained from pt's L AC for LP labs. Pt tolerated procedure well. Site is unremarkable. 

## 2018-10-13 NOTE — Discharge Instructions (Signed)

## 2018-10-15 NOTE — Progress Notes (Signed)
Office Visit Note  Patient: Brittney [name]             Date of Birth: 04/08/1971           MRN: [id number]             PCP: Kathyrn Drown, MD Referring: Kathyrn Drown, MD Visit Date: 10/28/2018 Occupation: Patient advocate  Subjective:  Pain in multiple joints.   History of Present Illness: Brittney [name] is a 47 y.o. female notation per request of Dr. Posey Pronto.  According to patient she has been having tingling and weakness in her upper extremities for several years.  She states in 2015 she was seen by her PCP at the time she declined nerve conduction velocity.  The symptoms have gradually gotten worse.  She states this summer while she was traveling she noticed that her left lower extremity had decreased strength.  She has been exercising and working out but has not noticed any improvement on her left side.  She believes that the left upper and lower extremity is weaker than the right side.  She has been also experiencing increased memory loss, brain fog and visual changes.  She has not seen an ophthalmologist yet.  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  She also had a spinal tap which was unremarkable.  She was referred to Dr. Posey Pronto who did some lab work.  Her labs came positive for ANA and positive Ro antibody.  Patient gives history of joint pain and joint popping and almost all of her joints.  She denies any history of joint swelling.  She gives history of dry mouth and dry eyes.  Activities of Daily Living:  Patient reports morning stiffness for 5-10 minutes.   Patient Reports nocturnal pain.  Difficulty dressing/grooming: Denies Difficulty climbing stairs: Denies Difficulty getting out of chair: Denies Difficulty using hands for taps, buttons, cutlery, and/or writing: Reports  Review of Systems  Constitutional: Negative for fatigue, night sweats, weight gain  and weight loss.  HENT: Positive for mouth sores. Negative for trouble swallowing, trouble swallowing, mouth dryness and nose dryness.   Eyes: Negative for pain, redness, itching, visual disturbance and dryness.  Respiratory: Negative for cough, shortness of breath, wheezing and difficulty breathing.   Cardiovascular: Negative for chest pain, palpitations, hypertension, irregular heartbeat and swelling in legs/feet.  Gastrointestinal: Positive for constipation and nausea. Negative for abdominal pain, blood in stool, diarrhea and vomiting.  Endocrine: Negative for increased urination.  Genitourinary: Negative for painful urination, nocturia, pelvic pain and vaginal dryness.  Musculoskeletal: Positive for arthralgias, joint pain and morning stiffness. Negative for joint swelling, myalgias, muscle weakness, muscle tenderness and myalgias.  Skin: Positive for hair loss. Negative for color change, rash, skin tightness, ulcers and sensitivity to sunlight.  Allergic/Immunologic: Negative for susceptible to infections.  Neurological: Positive for dizziness, memory loss and weakness. Negative for light-headedness, headaches and night sweats.  Hematological: Negative for bruising/bleeding tendency and swollen glands.  Psychiatric/Behavioral: Positive for confusion and sleep disturbance. Negative for depressed mood. The patient is not nervous/anxious.     PMFS History:  Patient Active Problem List   Diagnosis Date Noted  . 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 Essential hypertension, benign 07/12/2014  . Paresthesia of hand, bilateral 02/03/2014  . Unspecified constipation 02/09/2013  . Fatigue 01/03/2010  . Morbid obesity ([place]) 06/22/2009  . ALLERGIC RHINITIS, SEASONAL 06/22/2009  .  IBS 06/22/2009  . CHEST PAIN 06/22/2009    Past Medical History:  Diagnosis Date  . Acid reflux   . Chest pain    intermittent x 1 yr  . Diabetic mellitus, gestational   . Hypertension    during  pregnancy. Not treated for it now.  . IBS (irritable bowel syndrome)    dx in 2005 by primary MD  . IBS (irritable bowel syndrome)   . Obesity   . Seasonal allergies    yr roud; was immunothrapy in childhood, does not need any more at this time    Family History  Problem Relation Age of Onset  . Kidney failure Mother   . Cancer Father 52       colon cancer  . Healthy Sister   . Healthy Brother   . Cancer Brother   . Healthy Sister   . Eczema Daughter   . Allergies Daughter   . Eczema Son   . Allergies Son    Past Surgical History:  Procedure Laterality Date  . CESAREAN SECTION W/BTL  2007  . cholectystectomy  2000  . CHOLECYSTECTOMY  2000  . COLONOSCOPY N/A 02/19/2013   Procedure: COLONOSCOPY;  Surgeon: Rogene Houston, MD;  Location: [facility];  Service: Endoscopy;  Laterality: N/A;  78   Social History   Social History Narrative   Married 14 years, 2 children ages 65 and 62.    Job: Pt accounting   Gets regular exercise.     Objective: Vital Signs: BP 130/84 (BP Location: Right Arm, Patient Position: Sitting, Cuff Size: Normal)   Pulse 63   Resp 13   Ht 4' 11.5" (1.511 m)   Wt 174 lb 6.4 oz (79.1 kg)   LMP 10/01/2018   BMI 34.64 kg/m    Physical Exam  Constitutional: She is oriented to person, place, and time. She appears well-developed and well-nourished.  HENT:  Head: Normocephalic and atraumatic.  Eyes: Conjunctivae and EOM are normal.  Neck: Normal range of motion.  Cardiovascular: Normal rate, regular rhythm, normal heart sounds and intact distal pulses.  Pulmonary/Chest: Effort normal and breath sounds normal.  Abdominal: Soft. Bowel sounds are normal.  Lymphadenopathy:    She has no cervical adenopathy.  Neurological: She is alert and oriented to person, place, and time.  She had difficulty walking on her heels.  She also had difficulty curling her toes.  Skin: Skin is warm and dry. Capillary refill takes less than 2 seconds.  Psychiatric: She  has a normal mood and affect. Her behavior is normal.  Nursing note and vitals reviewed.    Musculoskeletal Exam: C-spine thoracic lumbar spine good range of motion.  Shoulder joints elbow joints wrist joint MCPs PIPs DIPs been good range of motion.  She has some hypermobility in her MCPs and PIPs.  Hip joints knee joints ankles MTPs PIPs been good range of motion.  She has hypermobility in her bilateral knee joints.  CDAI Exam: CDAI Score: Not documented Patient Global Assessment: Not documented; Provider Global Assessment: Not documented Swollen: Not documented; Tender: Not documented Joint Exam   Not documented   There is currently no information documented on the homunculus. Go to the Rheumatology activity and complete the homunculus joint exam.  Investigation: Findings:  09/09/18: SPEP WNL, IgA 323, IgG 1,181, IgM 39, Ro 1.3, La <0.1, ANA 1:160 Nuclear, Centromere, folate 17.2, CRP 6.8, sed rate 22, Vitamin B1 18, vitamin B12 503.   Component     Latest Ref  Rng & Units 09/09/2018  Total Protein     6.1 - 8.1 g/dL 7.0  Albumin ELP     3.8 - 4.8 g/dL 4.2  Alpha 1     0.2 - 0.3 g/dL 0.3  Alpha 2     0.5 - 0.9 g/dL 0.6  Beta Globulin     0.4 - 0.6 g/dL 0.4  Beta 2     0.2 - 0.5 g/dL 0.4  Gamma Globulin     0.8 - 1.7 g/dL 1.0  SPE Interp.        Immunofix Electr Int      NO MONOCLONAL PROTEIN DETECTED  Immunoglobulin A     47 - 310 mg/dL 323 (H)  IgG (Immunoglobin G), Serum     600 - 1,640 mg/dL 1,181  IgM, Serum     50 - 300 mg/dL 39 (L)  SSA (Ro) (ENA) Antibody, IgG     <1.0 NEG AI 1.3 POS (A)  SSB (La) (ENA) Antibody, IgG     <1.0 NEG AI <1.0 NEG  QUESTION/PROBLEM:        Specimen(s) Received:      LF  ANA Titer 1     titer 1:160 (H)  ANA Pattern 1      Nuclear, Centromere (A)  Anti Nuclear Antibody(ANA)     NEGATIVE POSITIVE (A)  Folate     ng/mL 17.2  CRP     <8.0 mg/L 6.8  Sed Rate     0 - 20 mm/h 22 (H)  Vitamin B1 (Thiamine)     8 - 30 nmol/L  18  Vitamin B12     200 - 1,100 pg/mL 503   Imaging: Dg Fluoro Guided Needle Plc Aspiration/injection Loc  Result Date: 10/13/2018 CLINICAL DATA:  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 MR brain suggests no central demyelinating disease. EXAM: DIAGNOSTIC LUMBAR PUNCTURE UNDER FLUOROSCOPIC GUIDANCE FLUOROSCOPY TIME:  30 seconds corresponding to a Dose Area Product of 58.14 Gy*m2 PROCEDURE: Informed consent was obtained from the patient prior to the procedure, including potential complications of headache, allergy, and pain. Time-out was performed. With the patient lateral decubitus, the lower back was prepped with Betadine. 1% Lidocaine was used for local anesthesia. Lumbar puncture was performed at the L3-L4 level using a 20 gauge needle with return of clear CSF with an opening pressure of 10.0 cm water. Approximately 18 ml of CSF were obtained for laboratory studies. The patient tolerated the procedure well and there were no apparent complications. Patient was given instructions for bed rest for 24 hours. IMPRESSION: Technically successful diagnostic lumbar puncture as described. Normal opening pressure with clear colorless fluid. Electronically Signed   By: Staci Righter M.D.   On: 10/13/2018 09:57    Recent Labs: Lab Results  Component Value Date   WBC 5.2 10/01/2017   HGB 13.2 10/01/2017   PLT 336 10/01/2017   NA 139 10/01/2017   K 4.5 10/01/2017   CL 97 10/01/2017   CO2 27 10/01/2017   GLUCOSE 91 10/01/2017   BUN 6 10/01/2017   CREATININE 0.66 10/01/2017   BILITOT 0.4 10/01/2017   ALKPHOS 53 10/01/2017   AST 19 10/01/2017   ALT 13 10/01/2017   PROT 7.0 09/09/2018   ALBUMIN 4.3 10/13/2018   CALCIUM 9.9 10/01/2017   GFRAA 122 10/01/2017    Speciality Comments: No specialty comments available.  Procedures:  No procedures performed Allergies: Miralax [polyethylene glycol] and Vasotec [enalapril]   Assessment / Plan:  Visit  Diagnoses: Sjogren's syndrome with keratoconjunctivitis sicca ([place]) -patient gives history of dry mouth.  She has mildly dry eyes.  She had good salivary pooling her mouth today.  She has been using Biot[employer] products for a while.  She has positive ANA low titer and positive Ro low titer.  Her most recent labs from 09/09/18: SPEP WNL, IgA 323, IgG 1,181, IgM 39, Ro 1.3, La <0.1, ANA 1:160 Nuclear, Centromere, folate 17.2, CRP 6.8, sed rate 22, Vitamin B1 18, vitamin B12 503 - I will obtain following labs to complete the work-up.  Plan: Anti-scleroderma antibody, RNP Antibody, Anti-Smith antibody, Anti-DNA antibody, double-stranded, C3 and C4, Beta-2 glycoprotein antibodies, Cardiolipin antibodies, IgG, IgM, IgA, Lupus Anticoagulant Eval w/Reflex  Neuropathy - Sensorimotor demyelinating and axonal neuropathy based on nerve conduction velocity -she has been followed by Dr. Posey Pronto.  The CSF is analysis was normal.  I will obtain following labs today.  Plan: Hemoglobin A1c, Rheumatoid factor, Cyclic citrul peptide antibody, IgG, Hepatitis B core antibody, IgM, Hepatitis B surface antigen, Hepatitis C antibody, HIV Antibody (routine testing w rflx)  Other fatigue -he complains of mild fatigue.  Plan: CBC with Differential/Platelet, COMPLETE METABOLIC PANEL WITH GFR, Urinalysis, Routine w reflex microscopic, CK, TSH, Glucose 6 phosphate dehydrogenase  Ulnar neuropathy of both upper extremities-she continues to have numbness in her bilateral upper extremities.  Numbness and tingling of left arm and leg-she complains of progressive weakness in her left upper and lower extremity.  Hypermobility arthralgia-she complains of pain in multiple joints and popping.  She had no synovitis on examination.  Essential hypertension, benign  History of IBS  History of gastroesophageal reflux (GERD)   Orders: Orders Placed This Encounter  Procedures  . CBC with Differential/Platelet  . COMPLETE METABOLIC PANEL WITH GFR    . Urinalysis, Routine w reflex microscopic  . CK  . TSH  . Hemoglobin A1c  . Rheumatoid factor  . Cyclic citrul peptide antibody, IgG  . Anti-scleroderma antibody  . RNP Antibody  . Anti-Smith antibody  . Anti-DNA antibody, double-stranded  . C3 and C4  . Beta-2 glycoprotein antibodies  . Cardiolipin antibodies, IgG, IgM, IgA  . Lupus Anticoagulant Eval w/Reflex  . Glucose 6 phosphate dehydrogenase  . Hepatitis B core antibody, IgM  . Hepatitis B surface antigen  . Hepatitis C antibody  . HIV Antibody (routine testing w rflx)   No orders of the defined types were placed in this encounter.   Face-to-face time spent with patient was 50 minutes. Greater than 50% of time was spent in counseling and coordination of care.  Follow-Up Instructions: No follow-ups on file.   Bo Merino, MD  Note - This record has been created using Editor, commissioning.  Chart creation errors have been sought, but may not always  have been located. Such creation errors do not reflect on  the standard of medical care.

## 2018-10-21 ENCOUNTER — Ambulatory Visit: Payer: 59 | Admitting: Neurology

## 2018-10-21 ENCOUNTER — Encounter: Payer: Self-pay | Admitting: Neurology

## 2018-10-21 VITALS — BP 130/90 | HR 72 | Ht 59.0 in | Wt 174.0 lb

## 2018-10-21 DIAGNOSIS — Z131 Encounter for screening for diabetes mellitus: Secondary | ICD-10-CM

## 2018-10-21 DIAGNOSIS — G6289 Other specified polyneuropathies: Secondary | ICD-10-CM | POA: Diagnosis not present

## 2018-10-21 NOTE — Patient Instructions (Addendum)
We will plan to order more autoimmune testing

## 2018-10-21 NOTE — Progress Notes (Signed)
[facility] Clinic Note - Initial Visit   Date: 10/21/18  Brittney [name] MRN: [id number] DOB: [birth year]   Dear [name]:  Thank you for your kind referral of Brittney [name] for consultation of subacute neuropathy. Although her history is well known to you, please allow [place] to reiterate it for the purpose of our medical record. The patient was accompanied to the clinic by husband Brittney [name]) who also provides collateral information.     History of Present Illness: Brittney [name] is a 47 y.o. right-handed African American female with hypertension and GERD presenting for second opinion of sensory neuropathy.  She has been evaluated by my colleague, Dr. Ellouise Newer, who has requested neuromuscular opinion in her care.   Brittney [name] first starting noticing numbness of the hands in 2015 and was recommended by her PCP, [name], to have NCS/EMG, but she felt it was due to carpal tunnel syndrome and did not pursue testing.  In 2018, she began having numbness of the left leg and both feet. Since August 2019, her numbness intensified over the forearm, hands, and feet, and the entire left leg. Her left leg numbness bothers her the most.  She has weakness of the arms and legs, especially on the left side.  She has noticed that even with exercising and lifting weights, she has to reduce her weight on the left side.  She has some imbalance and feels lightheaded.  She has not suffered any falls, but tends to trip often.  She occasionally has stabbing pain, like an ice-pick about 3-4 times per month, which is very brief.  No burning or stinging pain of the hands or feet.  She is more aware of numbness when she is resting and feet feel "hot" at night.    She saw [name] for these symptoms in early October 2019, who ordered electrodiagnostic and laboratory testing. 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  Labs revealed  elevated ANA titer 1:160 (centromere pattern) and elevated SSA 1.3 - no signs of vitamin deficiency or paraproteinemia.  She is scheduled to see [name] in rheumology next week.   She endorses dry mouth.  No dry eyes or dry skin. She complains of joint pain and popping.  She also feels that the bones in her back could "pop".   No changes in sweating pattern.  No early satiety.  She had gestational diabetes.  Her mother hand paresthesias of the hands, no formal diagnosis of neuropathy.  She drinks socially, several times per month.  No history of heavy alcohol use.    Out-side paper records, electronic medical record, and images have been reviewed where available and summarized as:  MRI brain 07/30/2018:  Normal noncontrast MRI appearance of the brain aside from partially empty sella, which can be a normal anatomic variant but is also associated with idiopathic intracranial hypertension (pseudotumor cerebri).  CSF opening pressure measurement would evaluate further.  NCS/EMG of the arms 10/06/2018:  The electrophysiologic findings are most consistent with a severe sensorimotor axonal and demyelinating polyneuropathy affecting the upper extremities. NCS/EMG of the legs 09/24/2018:  The electrophysiologic findings are most consistent with a chronic sensorimotor polyneuropathy, axonal loss and demyelinating and type, affecting bilateral lower extremities.  Labs 09/09/2018:  ESR 22, CRP 6.8, SSA 1.3*, ANA 1:160 centromere pattern, folate 17, vitamin B1 18, vitamin B12 503, SPEP with IFE no M protein  CSF 10/13/2018:  OP 66mHg   R1 W0 G62 P36  IgG index 0.46, no OCB, MBP neg, ACE 7, flow cytology and cytometry negative  Past Medical History:  Diagnosis Date  . Acid reflux   . Chest pain    intermittent x 1 yr  . Diabetic mellitus, gestational   . Hypertension    during pregnancy. Not treated for it now.  . IBS (irritable bowel syndrome)    dx in 2005 by primary MD  . IBS (irritable bowel  syndrome)   . Obesity   . Seasonal allergies    yr roud; was immunothrapy in childhood, does not need any more at this time    Past Surgical History:  Procedure Laterality Date  . CESAREAN SECTION W/BTL  2007  . cholectystectomy  2000  . CHOLECYSTECTOMY  2000  . COLONOSCOPY N/A 02/19/2013   Procedure: COLONOSCOPY;  Surgeon: Rogene Houston, MD;  Location: [facility];  Service: Endoscopy;  Laterality: N/A;  830     Medications:  Outpatient Encounter Medications as of 10/21/2018  Medication Sig  . loratadine (CLARITIN) 10 MG tablet Take 10 mg by mouth daily.  Marland Kitchen LORazepam (ATIVAN) 1 MG tablet Take one tablet 45 minutes prior to MRI. Do not drive while taking this med due to sedation.  Marland Kitchen losartan (COZAAR) 100 MG tablet Take 1 tablet (100 mg total) by mouth daily.  Marland Kitchen omeprazole (PRILOSEC) 20 MG capsule Take 20 mg by mouth daily.   No facility-administered encounter medications on file as of 10/21/2018.      Allergies:  Allergies  Allergen Reactions  . Miralax [Polyethylene Glycol] Swelling    Tongue swelling  . Vasotec [Enalapril]     cough    Family History: Family History  Problem Relation Age of Onset  . Cancer Father 54       colon cancer    Social History: Social History   Tobacco Use  . Smoking status: Never Smoker  . Smokeless tobacco: Never Used  Substance Use Topics  . Alcohol use: Yes    Comment: occassionally   . Drug use: No   Social History   Social History Narrative   Married 14 years, 2 children ages 81 and 63.    Job: Pt accounting   Gets regular exercise.     Review of Systems:  CONSTITUTIONAL: No fevers, chills, night sweats, or weight loss.   EYES: No visual changes or eye pain ENT: No hearing changes.  No history of nose bleeds.   RESPIRATORY: No cough, wheezing and shortness of breath.   CARDIOVASCULAR: Negative for chest pain, and palpitations.   GI: Negative for abdominal discomfort, blood in stools or black stools.  No recent  change in bowel habits.   GU:  No history of incontinence.   MUSCLOSKELETAL: +history of joint pain or swelling.  No myalgias.   SKIN: Negative for lesions, rash, and itching.   HEMATOLOGY/ONCOLOGY: Negative for prolonged bleeding, bruising easily, and swollen nodes.  No history of cancer.   ENDOCRINE: Negative for cold or heat intolerance, polydipsia or goiter.   PSYCH:  No depression or anxiety symptoms.   NEURO: As Above.   Vital Signs:  BP 130/90   Pulse 72   Ht 4' 11" (1.499 m)   Wt 174 lb (78.9 kg)   LMP 10/01/2018   SpO2 100%   BMI 35.14 kg/m    General Medical Exam:   General:  Well appearing, comfortable.   Eyes/ENT: see cranial nerve examination.   Neck: No masses appreciated.  Full range of motion  without tenderness.  No carotid bruits. Respiratory:  Clear to auscultation, good air entry bilaterally.   Cardiac:  Regular rate and rhythm, no murmur.   Extremities:  No deformities, edema, or skin discoloration.  Skin:  No rashes or lesions.  Neurological Exam: MENTAL STATUS including orientation to time, place, person, recent and remote memory, attention span and concentration, language, and fund of knowledge is normal.  Speech is not dysarthric.  CRANIAL NERVES: II:  No visual field defects.  Unremarkable fundi.   III-IV-VI:  Anisocoria with left pupil slightly larger than right, both are reactive to light.  No Horner's.  Normal conjugate, extra-ocular eye movements in all directions of gaze.  No nystagmus.  No ptosis.   V:  Normal facial sensation.    VII:  Normal facial symmetry and movements.  No pathologic facial reflexes.  VIII:  Normal hearing and vestibular function.   IX-X:  Normal palatal movement.   XI:  Normal shoulder shrug and head rotation.   XII:  Normal tongue strength and range of motion, no deviation or fasciculation.  MOTOR:  No atrophy, fasciculations or abnormal movements.  No pronator drift.  Tone is normal.    Right Upper Extremity:    Left  Upper Extremity:    Deltoid  5/5   Deltoid  5/5   Biceps  5/5   Biceps  5/5   Triceps  5/5   Triceps  5/5   Wrist extensors  5/5   Wrist extensors  5/5   Wrist flexors  5/5   Wrist flexors  5/5   Finger extensors  5/5   Finger extensors  5/5   Finger flexors  5/5   Finger flexors  5/5   Dorsal interossei  4/5   Dorsal interossei  4/5   Abductor pollicis  5-/5   Abductor pollicis  5-/5   Tone (Ashworth scale)  0  Tone (Ashworth scale)  0   Right Lower Extremity:    Left Lower Extremity:    Hip flexors  5/5   Hip flexors  5/5   Hip extensors  5/5   Hip extensors  5/5   Knee flexors  5/5   Knee flexors  5/5   Knee extensors  5/5   Knee extensors  5/5   Dorsiflexors  5/5   Dorsiflexors  5/5   Plantarflexors  5/5   Plantarflexors  5/5   Toe extensors  4/5   Toe extensors  4/5   Toe flexors  4/5   Toe flexors  4/5   Tone (Ashworth scale)  0  Tone (Ashworth scale)  0   MSRs:  Right                                                                 Left brachioradialis 2+  brachioradialis 2+  biceps 2+  biceps 2+  triceps 1+  triceps 1+  patellar 2+  patellar 2+  ankle jerk 2+  ankle jerk 2+  plantar response down  plantar response down   SENSORY:  Temperature is reduced over the feet bilaterally and vibration is mildly reduced at the great toe.  Pin prick and light touch is intact throughout.  Romberg's sign absent.   COORDINATION/GAIT: Normal finger-to- nose-finger.  Intact rapid alternating movements  bilaterally.  Gait narrow based and stable. Tandem and stressed gait intact.    IMPRESSION: Brittney [name] is a delightful 47 year-old female referred for second opinion of progressive paresthesias of the hands and feet in the setting of severe sensorimotor demyelinating and axonal neuropathy.  To my surprise, her neurological exam is not as impressive as the severity of findings on electrodiagnostic testing, which essentially shows absent sensory responses throughout.  Further, her reflexes  are relatively preserved and there is only trace distal weakness.  With respect to finding underling etiology of her neuropathy, her CSF testing is entirely normal, making polyradiculoneuropathy less likely.  Serology testing shows elevation in several autoimmune labs including ANA 1:160 with centromere pattern and SSA 1.3.  She will be seeing [name] next week to better understand whether this represents true disease versus systemic manifestation of inflammation.  With CIDP being ruled out, I will check sensorimotor autoimmune panel through [facility] for anti-MAG, anti-GALOP, anti-GM1, and anti-sulfatide antibodies. She will also be screened for diabetes. If her rheumatological evaluation does not indicate underlying rheumatological disease that neuropathy can be associated with, then I will plan to offer her a trial of high-dose solumedrol for possible autoimmune-mediated neuropathy.    Thank you for allowing me to participate in patient's care.  If I can answer any additional questions, I would be pleased to do so.    Sincerely,    Brittney Madera K. Posey Pronto, DO

## 2018-10-28 ENCOUNTER — Encounter: Payer: Self-pay | Admitting: Rheumatology

## 2018-10-28 ENCOUNTER — Ambulatory Visit: Payer: 59 | Admitting: Rheumatology

## 2018-10-28 VITALS — BP 130/84 | HR 63 | Resp 13 | Ht 59.5 in | Wt 174.4 lb

## 2018-10-28 DIAGNOSIS — R5383 Other fatigue: Secondary | ICD-10-CM | POA: Diagnosis not present

## 2018-10-28 DIAGNOSIS — G5623 Lesion of ulnar nerve, bilateral upper limbs: Secondary | ICD-10-CM

## 2018-10-28 DIAGNOSIS — M3501 Sicca syndrome with keratoconjunctivitis: Secondary | ICD-10-CM

## 2018-10-28 DIAGNOSIS — Z8719 Personal history of other diseases of the digestive system: Secondary | ICD-10-CM

## 2018-10-28 DIAGNOSIS — R202 Paresthesia of skin: Secondary | ICD-10-CM

## 2018-10-28 DIAGNOSIS — G629 Polyneuropathy, unspecified: Secondary | ICD-10-CM

## 2018-10-28 DIAGNOSIS — R200 Anesthesia of skin: Secondary | ICD-10-CM

## 2018-10-28 DIAGNOSIS — M2550 Pain in unspecified joint: Secondary | ICD-10-CM

## 2018-10-28 DIAGNOSIS — I10 Essential (primary) hypertension: Secondary | ICD-10-CM

## 2018-10-28 DIAGNOSIS — R768 Other specified abnormal immunological findings in serum: Secondary | ICD-10-CM | POA: Diagnosis not present

## 2018-10-31 LAB — CBC WITH DIFFERENTIAL/PLATELET
BASOS ABS: 40 {cells}/uL (ref 0–200)
Basophils Relative: 0.8 %
Eosinophils Absolute: 50 cells/uL (ref 15–500)
Eosinophils Relative: 1 %
HEMATOCRIT: 35.7 % (ref 35.0–45.0)
HEMOGLOBIN: 12 g/dL (ref 11.7–15.5)
LYMPHS ABS: 1665 {cells}/uL (ref 850–3900)
MCH: 30.5 pg (ref 27.0–33.0)
MCHC: 33.6 g/dL (ref 32.0–36.0)
MCV: 90.6 fL (ref 80.0–100.0)
MPV: 10.7 fL (ref 7.5–12.5)
Monocytes Relative: 6.8 %
NEUTROS ABS: 2905 {cells}/uL (ref 1500–7800)
NEUTROS PCT: 58.1 %
Platelets: 325 10*3/uL (ref 140–400)
RBC: 3.94 10*6/uL (ref 3.80–5.10)
RDW: 12.6 % (ref 11.0–15.0)
Total Lymphocyte: 33.3 %
WBC: 5 10*3/uL (ref 3.8–10.8)
WBCMIX: 340 {cells}/uL (ref 200–950)

## 2018-10-31 LAB — ANTI-SCLERODERMA ANTIBODY: Scleroderma (Scl-70) (ENA) Antibody, IgG: <1 AI

## 2018-10-31 LAB — COMPLETE METABOLIC PANEL WITH GFR
AG Ratio: 1.6 (calc) (ref 1.0–2.5)
ALBUMIN MSPROF: 4.5 g/dL (ref 3.6–5.1)
ALKALINE PHOSPHATASE (APISO): 37 U/L (ref 33–115)
ALT: 11 U/L (ref 6–29)
AST: 18 U/L (ref 10–35)
BUN: 8 mg/dL (ref 7–25)
CO2: 30 mmol/L (ref 20–32)
Calcium: 9.8 mg/dL (ref 8.6–10.2)
Chloride: 99 mmol/L (ref 98–110)
Creat: 0.61 mg/dL (ref 0.50–1.10)
GFR, Est African American: 124 mL/min/{1.73_m2} (ref >=60)
GFR, Est Non African American: 107 mL/min/{1.73_m2} (ref >=60)
GLOBULIN: 2.9 g/dL (ref 1.9–3.7)
GLUCOSE: 83 mg/dL (ref 65–99)
Potassium: 3.8 mmol/L (ref 3.5–5.3)
SODIUM: 137 mmol/L (ref 135–146)
Total Bilirubin: 0.5 mg/dL (ref 0.2–1.2)
Total Protein: 7.4 g/dL (ref 6.1–8.1)

## 2018-10-31 LAB — URINALYSIS, ROUTINE W REFLEX MICROSCOPIC
Bacteria, UA: NONE SEEN /HPF
Bilirubin Urine: NEGATIVE
GLUCOSE, UA: NEGATIVE
HYALINE CAST: NONE SEEN /LPF
Ketones, ur: NEGATIVE
Leukocytes, UA: NEGATIVE
Nitrite: NEGATIVE
Protein, ur: NEGATIVE
RBC / HPF: NONE SEEN /HPF (ref 0–2)
Specific Gravity, Urine: 1.009 (ref 1.001–1.03)
Squamous Epithelial / LPF: NONE SEEN /HPF (ref <=5)
WBC, UA: NONE SEEN /HPF (ref 0–5)
pH: 6 (ref 5.0–8.0)

## 2018-10-31 LAB — BETA-2 GLYCOPROTEIN ANTIBODIES: Beta-2 Glyco I IgG: <9 SGU (ref <=20)

## 2018-10-31 LAB — HEPATITIS B SURFACE ANTIGEN: HEP B S AG: NONREACTIVE

## 2018-10-31 LAB — ANTI-DNA ANTIBODY, DOUBLE-STRANDED: ds DNA Ab: <1 IU/mL

## 2018-10-31 LAB — LUPUS ANTICOAGULANT EVAL W/ REFLEX
PTT-LA Screen: 33 s (ref <=40)
dRVVT: 41 s (ref <=45)

## 2018-10-31 LAB — CK: Total CK: 199 U/L — ABNORMAL HIGH (ref 29–143)

## 2018-10-31 LAB — HEMOGLOBIN A1C
EAG (MMOL/L): 6.3 (calc)
HEMOGLOBIN A1C: 5.6 %{Hb} (ref <5.7)
MEAN PLASMA GLUCOSE: 114 (calc)

## 2018-10-31 LAB — CARDIOLIPIN ANTIBODIES, IGG, IGM, IGA
Anticardiolipin IgA: <11 [APL'U]
Anticardiolipin IgM: <12 [MPL'U]

## 2018-10-31 LAB — HEPATITIS B CORE ANTIBODY, IGM: Hep B C IgM: NONREACTIVE

## 2018-10-31 LAB — C3 AND C4
C3 Complement: 138 mg/dL (ref 83–193)
C4 Complement: 42 mg/dL (ref 15–57)

## 2018-10-31 LAB — RHEUMATOID FACTOR

## 2018-10-31 LAB — RNP ANTIBODY: RIBONUCLEIC PROTEIN(ENA) ANTIBODY, IGG: NEGATIVE AI

## 2018-10-31 LAB — TSH: TSH: 1.35 mIU/L

## 2018-10-31 LAB — GLUCOSE 6 PHOSPHATE DEHYDROGENASE: G-6PDH: 12.8 U/g Hgb (ref 7.0–20.5)

## 2018-10-31 LAB — CYCLIC CITRUL PEPTIDE ANTIBODY, IGG

## 2018-10-31 LAB — ANTI-SMITH ANTIBODY: ENA SM Ab Ser-aCnc: <1 AI

## 2018-10-31 LAB — HEPATITIS C ANTIBODY
HEP C AB: NONREACTIVE
SIGNAL TO CUT-OFF: 0.01 (ref <1.00)

## 2018-10-31 LAB — HIV ANTIBODY (ROUTINE TESTING W REFLEX): HIV 1&2 Ab, 4th Generation: NONREACTIVE

## 2018-11-02 NOTE — Progress Notes (Signed)
We will discuss at the follow-up visit.

## 2018-11-03 ENCOUNTER — Other Ambulatory Visit: Payer: Self-pay | Admitting: Family Medicine

## 2018-11-03 LAB — CNS IGG SYNTHESIS RATE, CSF+BLOOD
ALBUMIN SERUM: 4.3 g/dL (ref 3.5–5.2)
ALBUMIN,CSF: 14 mg/dL (ref 8.0–42.0)
CNS-IgG Synthesis Rate: -4.3 mg/24 h (ref <=3.3)
IgG (Immunoglobin G), Serum: 1140 mg/dL (ref 600–1640)
IgG Total CSF: 1.7 mg/dL (ref 0.8–7.7)
IgG-Index: 0.46 (ref <0.66)

## 2018-11-03 LAB — LEUKEMIA/LYMPHOMA EVALUATION PANEL
NUMBER OF MARKERS: 14
VIABILITY: 7 %

## 2018-11-03 LAB — CSF CELL COUNT WITH DIFFERENTIAL
RBC COUNT CSF: 1 {cells}/uL (ref 0–10)
WBC CSF: 0 {cells}/uL (ref 0–5)

## 2018-11-03 LAB — PROTEIN, CSF: Total Protein, CSF: 36 mg/dL (ref 15–45)

## 2018-11-03 LAB — MYELIN BASIC PROTEIN, CSF: Myelin Basic Protein: <2 mcg/L (ref 2.0–4.0)

## 2018-11-03 LAB — ANGIOTENSIN CONVERTING ENZYME, CSF: ACE, CSF: 7 U/L (ref <=15)

## 2018-11-03 LAB — GLUCOSE, CSF: GLUCOSE CSF: 62 mg/dL (ref 40–80)

## 2018-11-03 LAB — OLIGOCLONAL BANDS, CSF + SERM

## 2018-11-04 ENCOUNTER — Telehealth: Payer: Self-pay | Admitting: Neurology

## 2018-11-04 DIAGNOSIS — M2550 Pain in unspecified joint: Secondary | ICD-10-CM | POA: Insufficient documentation

## 2018-11-04 DIAGNOSIS — G5623 Lesion of ulnar nerve, bilateral upper limbs: Secondary | ICD-10-CM | POA: Insufficient documentation

## 2018-11-04 DIAGNOSIS — M3501 Sicca syndrome with keratoconjunctivitis: Secondary | ICD-10-CM | POA: Insufficient documentation

## 2018-11-04 NOTE — Progress Notes (Deleted)
Office Visit Note  Patient: Brittney [name]             Date of Birth: Oct 12, 1971           MRN: [id number]             PCP: Kathyrn Drown, MD Referring: Kathyrn Drown, MD Visit Date: 11/18/2018 Occupation: @GUAROCC @  Subjective:  No chief complaint on file.   History of Present Illness: Brittney [name] is a 47 y.o. female ***   Activities of Daily Living:  Patient reports morning stiffness for *** {minute/hour:19697}.   Patient {ACTIONS;DENIES/REPORTS:21021675::"Denies"} nocturnal pain.  Difficulty dressing/grooming: {ACTIONS;DENIES/REPORTS:21021675::"Denies"} Difficulty climbing stairs: {ACTIONS;DENIES/REPORTS:21021675::"Denies"} Difficulty getting out of chair: {ACTIONS;DENIES/REPORTS:21021675::"Denies"} Difficulty using hands for taps, buttons, cutlery, and/or writing: {ACTIONS;DENIES/REPORTS:21021675::"Denies"}  No Rheumatology ROS completed.   PMFS History:  Patient Active Problem List   Diagnosis Date Noted  . 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 Essential hypertension, benign 07/12/2014  . Paresthesia of hand, bilateral 02/03/2014  . Unspecified constipation 02/09/2013  . Fatigue 01/03/2010  . Morbid obesity ([place]) 06/22/2009  . ALLERGIC RHINITIS, SEASONAL 06/22/2009  . IBS 06/22/2009  . CHEST PAIN 06/22/2009    Past Medical History:  Diagnosis Date  . Acid reflux   . Chest pain    intermittent x 1 yr  . Diabetic mellitus, gestational   . Hypertension    during pregnancy. Not treated for it now.  . IBS (irritable bowel syndrome)    dx in 2005 by primary MD  . IBS (irritable bowel syndrome)   . Obesity   . Seasonal allergies    yr roud; was immunothrapy in childhood, does not need any more at this time    Family History  Problem Relation Age of Onset  . Kidney failure Mother   . Cancer Father 58       colon cancer  . Healthy Sister   . Healthy Brother   . Cancer Brother   . Healthy Sister   . Eczema Daughter   . Allergies Daughter     . Eczema Son   . Allergies Son    Past Surgical History:  Procedure Laterality Date  . CESAREAN SECTION W/BTL  2007  . cholectystectomy  2000  . CHOLECYSTECTOMY  2000  . COLONOSCOPY N/A 02/19/2013   Procedure: COLONOSCOPY;  Surgeon: Rogene Houston, MD;  Location: [facility];  Service: Endoscopy;  Laterality: N/A;  9   Social History   Social History Narrative   Married 14 years, 2 children ages 66 and 74.    Job: Pt accounting   Gets regular exercise.     Objective: Vital Signs: There were no vitals taken for this visit.   Physical Exam   Musculoskeletal Exam: ***  CDAI Exam: CDAI Score: Not documented Patient Global Assessment: Not documented; Provider Global Assessment: Not documented Swollen: Not documented; Tender: Not documented Joint Exam   Not documented   There is currently no information documented on the homunculus. Go to the Rheumatology activity and complete the homunculus joint exam.  Investigation: No additional findings.  Imaging: Dg Fluoro Guided Needle Plc Aspiration/injection Loc  Result Date: 10/13/2018 CLINICAL DATA:  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 MR brain suggests no central demyelinating disease. EXAM: DIAGNOSTIC LUMBAR PUNCTURE UNDER FLUOROSCOPIC GUIDANCE FLUOROSCOPY TIME:  30 seconds corresponding to a Dose Area Product of 58.14 Gy*m2 PROCEDURE: Informed consent was obtained from the patient prior to the procedure, including potential complications of headache, allergy, and pain. Time-out was  performed. With the patient lateral decubitus, the lower back was prepped with Betadine. 1% Lidocaine was used for local anesthesia. Lumbar puncture was performed at the L3-L4 level using a 20 gauge needle with return of clear CSF with an opening pressure of 10.0 cm water. Approximately 18 ml of CSF were obtained for laboratory studies. The patient tolerated the procedure well and there were no  apparent complications. Patient was given instructions for bed rest for 24 hours. IMPRESSION: Technically successful diagnostic lumbar puncture as described. Normal opening pressure with clear colorless fluid. Electronically Signed   By: Staci Righter M.D.   On: 10/13/2018 09:57    Recent Labs: Lab Results  Component Value Date   WBC 5.0 10/28/2018   HGB 12.0 10/28/2018   PLT 325 10/28/2018   NA 137 10/28/2018   K 3.8 10/28/2018   CL 99 10/28/2018   CO2 30 10/28/2018   GLUCOSE 83 10/28/2018   BUN 8 10/28/2018   CREATININE 0.61 10/28/2018   BILITOT 0.5 10/28/2018   ALKPHOS 53 10/01/2017   AST 18 10/28/2018   ALT 11 10/28/2018   PROT 7.4 10/28/2018   ALBUMIN 4.3 10/13/2018   CALCIUM 9.8 10/28/2018   GFRAA 124 10/28/2018  UA negative, CK 199, hepatitis B-, hepatitis C negative, HIV negative, G6PD normal, TSH normal RF negative, anti-CCP negative, ENA negative, C3-C4 normal, beta-2 negative, anticardiolipin negative, lupus anticoagulant negative, hemoglobin A1c 5.6  Speciality Comments: No specialty comments available.  Procedures:  No procedures performed Allergies: Miralax [polyethylene glycol] and Vasotec [enalapril]   Assessment / Plan:     Visit Diagnoses: No diagnosis found.   Orders: No orders of the defined types were placed in this encounter.  No orders of the defined types were placed in this encounter.   Face-to-face time spent with patient was *** minutes. Greater than 50% of time was spent in counseling and coordination of care.  Follow-Up Instructions: No follow-ups on file.   Bo Merino, MD  Note - This record has been created using Editor, commissioning.  Chart creation errors have been sought, but may not always  have been located. Such creation errors do not reflect on  the standard of medical care.

## 2018-11-04 NOTE — Telephone Encounter (Signed)
Patient left vm about questions concerning lab/neuropathy profile? Please call her back at [phone]. Thanks!

## 2018-11-16 DIAGNOSIS — G6289 Other specified polyneuropathies: Secondary | ICD-10-CM | POA: Diagnosis not present

## 2018-11-18 ENCOUNTER — Ambulatory Visit: Payer: 59 | Admitting: Rheumatology

## 2018-11-18 DIAGNOSIS — Z8719 Personal history of other diseases of the digestive system: Secondary | ICD-10-CM | POA: Insufficient documentation

## 2018-11-18 NOTE — Progress Notes (Signed)
Office Visit Note  Patient: Brittney [name]             Date of Birth: October 24, 1971           MRN: [id number]             PCP: Kathyrn Drown, MD Referring: Kathyrn Drown, MD Visit Date: 11/20/2018 Occupation: @GUAROCC @  Subjective:  Dry mouth  History of Present Illness: Brittney [name] is a 47 y.o. female with history of Sjogren's syndrome.  She continues to have dry mouth symptoms.  Her eye symptoms are manageable.  She continues to have neuropathy in her extremities.  She will be seeing Dr. Posey Pronto for work-up.  She has appointment coming up next week.  Patient reports that she had extensive lab work for neurological work-up.  She has been experiencing some discomfort in her hands.  She denies any joint swelling.  Activities of Daily Living:  Patient reports morning stiffness for 5 minutes.   Patient Reports nocturnal pain.  Difficulty dressing/grooming: Denies Difficulty climbing stairs: Denies Difficulty getting out of chair: Denies Difficulty using hands for taps, buttons, cutlery, and/or writing: Reports  Review of Systems  Constitutional: Negative for fatigue.  HENT: Positive for mouth dryness. Negative for mouth sores, trouble swallowing, trouble swallowing and nose dryness.   Eyes: Negative for pain, redness, itching, visual disturbance and dryness.  Respiratory: Negative for cough, hemoptysis, shortness of breath, wheezing and difficulty breathing.   Cardiovascular: Negative for chest pain, palpitations, hypertension and swelling in legs/feet.  Gastrointestinal: Positive for constipation and nausea. Negative for abdominal pain, blood in stool, diarrhea and vomiting.  Endocrine: Negative for increased urination.  Genitourinary: Negative for painful urination, nocturia and pelvic pain.  Musculoskeletal: Positive for arthralgias, joint pain and morning stiffness. Negative for joint swelling, myalgias, muscle weakness, muscle tenderness and myalgias.  Skin: Positive for rash and  hair loss. Negative for color change, pallor, nodules/bumps, skin tightness, ulcers and sensitivity to sunlight.  Allergic/Immunologic: Negative for susceptible to infections.  Neurological: Negative for dizziness, light-headedness, numbness, headaches and memory loss.  Hematological: Negative for bruising/bleeding tendency and swollen glands.  Psychiatric/Behavioral: Negative for depressed mood, confusion and sleep disturbance. The patient is not nervous/anxious.     PMFS History:  Patient Active Problem List   Diagnosis Date Noted  . History of gastroesophageal reflux (GERD) 11/18/2018  . History of IBS 11/18/2018  . 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 Ulnar neuropathy of both upper extremities 11/04/2018  . 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 Essential hypertension, benign 07/12/2014  . Paresthesia of hand, bilateral 02/03/2014  . Unspecified constipation 02/09/2013  . Fatigue 01/03/2010  . Morbid obesity ([place]) 06/22/2009  . ALLERGIC RHINITIS, SEASONAL 06/22/2009  . IBS 06/22/2009  . CHEST PAIN 06/22/2009    Past Medical History:  Diagnosis Date  . Acid reflux   . Chest pain    intermittent x 1 yr  . Diabetic mellitus, gestational   . Hypertension    during pregnancy. Not treated for it now.  . IBS (irritable bowel syndrome)    dx in 2005 by primary MD  . IBS (irritable bowel syndrome)   . Obesity   . Seasonal allergies    yr roud; was immunothrapy in childhood, does not need any more at this time    Family History  Problem Relation Age of Onset  . Kidney failure Mother   . Cancer Father 64       colon  cancer  . Healthy Sister   . Healthy Brother   . Cancer Brother   . Healthy Sister   . Eczema Daughter   . Allergies Daughter   . Eczema Son   . Allergies Son    Past Surgical History:  Procedure Laterality Date  . CESAREAN SECTION W/BTL  2007  . cholectystectomy  2000    . CHOLECYSTECTOMY  2000  . COLONOSCOPY N/A 02/19/2013   Procedure: COLONOSCOPY;  Surgeon: Rogene Houston, MD;  Location: [facility];  Service: Endoscopy;  Laterality: N/A;  43   Social History   Social History Narrative   Married 14 years, 2 children ages 74 and 65.    Job: Pt accounting   Gets regular exercise.     Objective: Vital Signs: BP 123/79 (BP Location: Left Arm, Patient Position: Sitting, Cuff Size: Normal)   Pulse 69   Resp 14   Ht 4' 11.5" (1.511 m)   Wt 173 lb (78.5 kg)   BMI 34.36 kg/m    Physical Exam Vitals signs and nursing note reviewed.  Constitutional:      Appearance: She is well-developed.  HENT:     Head: Normocephalic and atraumatic.  Eyes:     Conjunctiva/sclera: Conjunctivae normal.  Neck:     Musculoskeletal: Normal range of motion.  Cardiovascular:     Rate and Rhythm: Normal rate and regular rhythm.     Heart sounds: Normal heart sounds.  Pulmonary:     Effort: Pulmonary effort is normal.     Breath sounds: Normal breath sounds.  Abdominal:     General: Bowel sounds are normal.     Palpations: Abdomen is soft.  Lymphadenopathy:     Cervical: No cervical adenopathy.  Skin:    General: Skin is warm and dry.     Capillary Refill: Capillary refill takes less than 2 seconds.  Neurological:     Mental Status: She is alert and oriented to person, place, and time.  Psychiatric:        Behavior: Behavior normal.      Musculoskeletal Exam: C-spine thoracic lumbar spine good range of motion.  Shoulder joints elbow joints wrist joint MCPs PIPs DIPs were in good range of motion with no synovitis.  Hip joints, knee joints, ankles MTPs PIPs were in good range of motion with no synovitis.  CDAI Exam: CDAI Score: Not documented Patient Global Assessment: Not documented; Provider Global Assessment: Not documented Swollen: Not documented; Tender: Not documented Joint Exam   Not documented   There is currently no information documented on  the homunculus. Go to the Rheumatology activity and complete the homunculus joint exam.  Investigation: No additional findings.  Imaging: No results found.  Recent Labs: Lab Results  Component Value Date   WBC 5.0 10/28/2018   HGB 12.0 10/28/2018   PLT 325 10/28/2018   NA 137 10/28/2018   K 3.8 10/28/2018   CL 99 10/28/2018   CO2 30 10/28/2018   GLUCOSE 83 10/28/2018   BUN 8 10/28/2018   CREATININE 0.61 10/28/2018   BILITOT 0.5 10/28/2018   ALKPHOS 53 10/01/2017   AST 18 10/28/2018   ALT 11 10/28/2018   PROT 7.4 10/28/2018   ALBUMIN 4.3 10/13/2018   CALCIUM 9.8 10/28/2018   GFRAA 124 10/28/2018  Lupus anticoagulant negative, beta-2 negative, anticardiolipin negative, C3-C4 normal, ENA negative, G6PD normal, RF negative, anti-CCP negative, hepatitis B-, hepatitis C negative, HIV negative  Speciality Comments: No specialty comments available.  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  ANA 1: 160 nuclear and centromere pattern, positive anti-Ro antibody.  Rest of the ENA negative.  Will counsel regarding Sjogren's was provided.  Over-the-counter products were discussed.  Her symptoms are not severe.  We discussed and future if her symptoms become more severe Plaquenil could be considered.  I have given her a handout on Plaquenil to read.  I discussed the association of arrhythmias with positive Ro antibody.  I have given her prescription to get EKG with her PCP to look for arrhythmias.  Mixed axonal-demyelinating neuropathy - Based on nerve conduction velocity done by Dr. Posey Pronto CSF analysis negative.  Patient had some more serology recently.  She has follow-up appointment coming up with Dr. Posey Pronto.  She continues to have neuropathy symptoms in her both upper and lower  extremities.  Ulnar neuropathy of both upper extremities  Hypermobility arthralgia-she complains of some discomfort in her hands.  No synovitis was noted.  Essential hypertension, benign-her blood pressure is controlled.  History of gastroesophageal reflux (GERD)  History of IBS   Orders: No orders of the defined types were placed in this encounter.  No orders of the defined types were placed in this encounter.   Face-to-face time spent with patient was 30 minutes. Greater than 50% of time was spent in counseling and coordination of care.  Follow-Up Instructions: Return in about 3 months (around 02/19/2019) for Sjogren's and neuropathy.   Bo Merino, MD  Note - This record has been created using Editor, commissioning.  Chart creation errors have been sought, but may not always  have been located. Such creation errors do not reflect on  the standard of medical care.

## 2018-11-20 ENCOUNTER — Ambulatory Visit: Payer: 59 | Admitting: Rheumatology

## 2018-11-20 ENCOUNTER — Encounter: Payer: Self-pay | Admitting: Rheumatology

## 2018-11-20 VITALS — BP 123/79 | HR 69 | Resp 14 | Ht 59.5 in | Wt 173.0 lb

## 2018-11-20 DIAGNOSIS — M3501 Sicca syndrome with keratoconjunctivitis: Secondary | ICD-10-CM | POA: Diagnosis not present

## 2018-11-20 DIAGNOSIS — G5623 Lesion of ulnar nerve, bilateral upper limbs: Secondary | ICD-10-CM

## 2018-11-20 DIAGNOSIS — M2550 Pain in unspecified joint: Secondary | ICD-10-CM | POA: Diagnosis not present

## 2018-11-20 DIAGNOSIS — Z8719 Personal history of other diseases of the digestive system: Secondary | ICD-10-CM

## 2018-11-20 DIAGNOSIS — G6289 Other specified polyneuropathies: Secondary | ICD-10-CM

## 2018-11-20 DIAGNOSIS — I10 Essential (primary) hypertension: Secondary | ICD-10-CM

## 2018-11-20 NOTE — Patient Instructions (Signed)
Sjogren Syndrome    Sjogren syndrome is a disease in which the body's disease-fighting system (immune system) attacks the glands that produce tears (lacrimal glands) and the glands that produce saliva (salivary glands). This makes the eyes and mouth very dry.  Sjogren syndrome is a long-term (chronic) disorder that has no cure. In some cases, it is linked to other disorders (rheumatic disorders), such as rheumatoid arthritis and systemic lupus erythematosus (SLE). It may affect other parts of the body, such as:   Kidneys.   Blood vessels.   Joints.   Lungs.   Liver.   Pancreas.   Brain.   Nerves.   Spinal cord.  What are the causes?  The cause of this condition is not known. It may be passed along from parent to child (inherited), or it may be a symptom of a rheumatic disorder.  What increases the risk?  This condition is more likely to develop in:   Women.   People who are 45-50 years old.   People who have recently had a viral infection or currently have a viral infection.  What are the signs or symptoms?  The main symptoms of this condition are:   Dry mouth. This may include:  ? A chalky feeling.  ? Difficulty swallowing, speaking, or tasting.  ? Frequent cavities in teeth.  ? Frequent mouth infections.   Dry eyes. This may include:  ? Burning, redness, and itching.  ? Blurry vision.  ? Light sensitivity.  Other symptoms may include:   Dryness of the skin and the inside of the nose.   Eyelid infections.   Vaginal dryness, if this applies.   Joint pain and stiffness.   Muscle pain and stiffness.  How is this diagnosed?  This condition is diagnosed based on:   Your symptoms.   Your medical history.   A physical exam of your eyes and mouth.  You may have tests, including:   Schirmer test. This tests your tear production.   An eye exam that is done with a magnifying device (slit-lamp exam).   An eye test that temporarily stains your eye with dye. This shows the extent of eye damage.   Tests  to check your salivary gland function.   Biopsy. This is a removal of part of a salivary gland from inside your lower lip to be studied under a microscope.   Chest X-rays.   Blood tests.   Urine tests.  How is this treated?  There is no cure for this condition, but treatment can help you manage your symptoms. Treatment options may include:   Moisture replacement therapies to help relieve dryness in your skin, mouth, and eyes.   NSAIDs to help relieve pain and stiffness.   Medicines to help relieve inflammation in your body(corticosteroids). These are usually for severe cases.   Medicines to help reduce the activity of your immune system (immunosuppressants).   Surgery or insertion of plugs to close the lacrimal glands (punctal occlusion). Thishelps keep more natural tears in your eyes.  Follow these instructions at home:   Take over-the-counter and prescription medicines only as told by your health care provider.   Take these actions to care for your eyes:  ? Use eye drops as told by your health care provider.  ? Blink at least 5-6 times a minute.  ? Protect your eyes from drafts and breezes.  ? Maintain properly humidified air. You may want to use a humidifier at home.  ? Avoid smoke.     Take these actions to care for your mouth:  ? Brush your teeth and floss after every meal.  ? Chew sugar-free gum or suck on hard candy. For some people, this can help to relieve dry mouth.  ? Use antimicrobial mouthwash daily.  ? Take frequent sips of water or sugar-free drinks.  ? Use saliva substitutes or lip balm as told by your health care provider.   Drink enough fluid to keep your urine clear or pale yellow.   Schedule and attend dentist visits every six months.   Keep all follow-up visits as told by your health care provider. This is important.  Contact a health care provider if:   You have a fever.   You have night sweats.   You are always tired.   You have unexplained weight loss.   You develop itchy  skin.   You have red patches on your skin.   You have a lump or swelling on your neck.  This information is not intended to replace advice given to you by your health care provider. Make sure you discuss any questions you have with your health care provider.  Document Released: 11/08/2002 Document Revised: 07/14/2016 Document Reviewed: 07/27/2015  Elsevier Interactive Patient Education  2019 [employer]

## 2018-11-23 ENCOUNTER — Encounter: Payer: Self-pay | Admitting: Neurology

## 2018-11-23 ENCOUNTER — Ambulatory Visit: Payer: 59 | Admitting: Neurology

## 2018-11-23 VITALS — BP 112/70 | HR 68 | Ht 59.5 in | Wt 168.0 lb

## 2018-11-23 DIAGNOSIS — G6289 Other specified polyneuropathies: Secondary | ICD-10-CM | POA: Diagnosis not present

## 2018-11-23 NOTE — Patient Instructions (Signed)
We will call you with the results 

## 2018-11-23 NOTE — Progress Notes (Signed)
Follow-up Visit   Date: 11/23/18    Brittney [name] MRN: [id number] DOB: [birth year]   Interim History: Brittney [name] is a 47 y.o. right-handed African American female with hypertension and GERD returning to the clinic for follow-up of sensory neuropathy.  The patient was accompanied to the clinic by self.  History of present illness: Mrs. Brittney [name] first starting noticing numbness of the hands in 2015 and was recommended by her PCP, [name], to have NCS/EMG, but she felt it was due to carpal tunnel syndrome and did not pursue testing.  In 2018, she began having numbness of the left leg and both feet. Since August 2019, her numbness intensified over the forearm, hands, and feet, and the entire left leg. Her left leg numbness bothers her the most.  She has weakness of the arms and legs, especially on the left side.  She has noticed that even with exercising and lifting weights, she has to reduce her weight on the left side.  She has some imbalance and feels lightheaded.  She has not suffered any falls, but tends to trip often.  She occasionally has stabbing pain, like an ice-pick about 3-4 times per month, which is very brief.  No burning or stinging pain of the hands or feet.  She is more aware of numbness when she is resting and feet feel "hot" at night.    She saw [name] for these symptoms in early October 2019, who ordered electrodiagnostic and laboratory testing. 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  Labs revealed elevated ANA titer 1:160 (centromere pattern) and elevated SSA 1.3 - no signs of vitamin deficiency or paraproteinemia. She is scheduled to see [name] in rheumology next week.   She endorses dry mouth.  No dry eyes or dry skin. She complains of joint pain and popping.  She also feels that the bones in her back could "pop".   No changes in sweating pattern.  No early satiety.  She had gestational diabetes.   Her mother hand paresthesias of the hands, no formal diagnosis of neuropathy.  She drinks socially, several times per month.  No history of heavy alcohol use.   UPDATE 11/23/2018:  She is here for follow-up visit.  She has noticed mild progression of hand weakness and numbness since her last visit.  She continues to have numbness of the feet, especially on the left.  She has mild imbalance, no falls.  She does not have weakness of the legs.  [name] autoimmune sensory panel is pending.   She was evaluated by [name] and diagnosed with mild Sjrogren's disease.  Her mother had CHF and possible neuropathy.    Medications:  Current Outpatient Medications on File Prior to Visit  Medication Sig Dispense Refill  . BIOTIN PO Take by mouth daily.    . cholecalciferol (VITAMIN D) 1000 units tablet Take 2,000 Units by mouth daily.    Marland Kitchen loratadine (CLARITIN) 10 MG tablet Take 10 mg by mouth daily.    Marland Kitchen losartan (COZAAR) 100 MG tablet TAKE 1 TABLET BY MOUTH EVERY DAY 90 tablet 3  . Multiple Vitamins-Minerals (MULTIVITAMIN PO) Take by mouth daily.    Marland Kitchen omeprazole (PRILOSEC) 20 MG capsule Take 20 mg by mouth as needed.      No current facility-administered medications on file prior to visit.     Allergies:  Allergies  Allergen Reactions  . Miralax [Polyethylene Glycol] Swelling    Tongue swelling  .  Vasotec [Enalapril]     cough    Review of Systems:  CONSTITUTIONAL: No fevers, chills, night sweats, or weight loss.  EYES: No visual changes or eye pain ENT: No hearing changes.  No history of nose bleeds.   RESPIRATORY: No cough, wheezing and shortness of breath.   CARDIOVASCULAR: Negative for chest pain, and palpitations.   GI: Negative for abdominal discomfort, blood in stools or black stools.  No recent change in bowel habits.   [name]:  No history of incontinence.   MUSCLOSKELETAL: No history of joint pain or swelling.  No myalgias.   SKIN: Negative for lesions, rash, and itching.     ENDOCRINE: Negative for cold or heat intolerance, polydipsia or goiter.   PSYCH:  No depression or anxiety symptoms.   NEURO: As Above.   Vital Signs:  BP 112/70   Pulse 68   Ht 4' 11.5" (1.511 m)   Wt 168 lb (76.2 kg)   SpO2 99%   BMI 33.36 kg/m     General Medical Exam:   General:  Well appearing, comfortable  Eyes/ENT: see cranial nerve examination.   Neck: No masses appreciated.  Full range of motion without tenderness.  No carotid bruits. Respiratory:  Clear to auscultation, good air entry bilaterally.   Cardiac:  Regular rate and rhythm, no murmur.   Ext:  No edema  Neurological Exam: MENTAL STATUS including orientation to time, place, person, recent and remote memory, attention span and concentration, language, and fund of knowledge is normal.  Speech is not dysarthric.  CRANIAL NERVES:  Anisocoria with left pupil larger than right, both are reactive to light.  No ptosis.  Normal conjugate, extra-ocular eye movements in all directions of gaze.  Normal facial sensation.  Face is symmetric. Palate elevates symmetrically.  Tongue is midline.  MOTOR:  Motor strength is 5/5 in all extremities, except mild weakness of hand interosseus muscles 4/5 and toe extension/flexion.  No atrophy, fasciculations or abnormal movements.  No pronator drift.  Tone is normal.    MSRs:  Reflexes are 2+/4 throughout, except 1+ triceps bilaterally.  SENSORY: Temperature is reduced over the feet and palms bilaterally, intact in the forearm.  Vibration is reduced at the right MCP and feet mildly.   Mild sway with Rhomberg testing.   COORDINATION/GAIT:   Gait narrow based and stable.  Tandem gait intact.   Data: MRI brain 07/30/2018:  Normal noncontrast MRI appearance of the brain aside from partially empty sella, which can be a normal anatomic variant but is also associated with idiopathic intracranial hypertension (pseudotumor cerebri).  CSF opening pressure measurement would evaluate  further.  NCS/EMG of the arms 10/06/2018:  The electrophysiologic findings are most consistent with a severe sensorimotor axonal and demyelinating polyneuropathy affecting the upper extremities. NCS/EMG of the legs 09/24/2018:  The electrophysiologic findings are most consistent with a chronic sensorimotor polyneuropathy, axonal loss and demyelinating and type, affecting bilateral lower extremities.  Labs 09/09/2018:  ESR 22, CRP 6.8, SSA 1.3*, ANA 1:160 centromere pattern, folate 17, vitamin B1 18, vitamin B12 503, SPEP with IFE no M protein  CSF 10/13/2018:  OP 78mHg   R1 W0 G62 P36  IgG index 0.46, no OCB, MBP neg, ACE 7, flow cytology and cytometry negative  Lab Results  Component Value Date   HGBA1C 5.6 10/28/2018    IMPRESSION/PLAN: Sensory demyelinating and axonal neuropathy manifesting with progressive paresthesias of the hands and feet since 2015 and diagnosed in 2019 by NCS/EMG.  Underlying etiology remains  unclear.  CSF is normal.  She has Sjogren's disease, which is followed by [name], whose care is appreciated.  Her Sjogren's disease, however, is too mild to cause the severity of her neuropathy.  [name] autoimmune sensory panel is pending.  I will also check genetic testing for transthyretin and other hereditary neuropathies.  If these panels are negative, the next step will be 3-day trial of high dose methylprednisolone.   Return to clinic after testing.   Thank you for allowing me to participate in patient's care.  If I can answer any additional questions, I would be pleased to do so.    Sincerely,    Donika K. Posey Pronto, DO

## 2018-11-30 ENCOUNTER — Ambulatory Visit (HOSPITAL_COMMUNITY)
Admission: RE | Admit: 2018-11-30 | Discharge: 2018-11-30 | Disposition: A | Discharge status: Home / Self Care | Payer: 59 | Source: Ambulatory Visit | Attending: Rheumatology | Admitting: Rheumatology

## 2018-11-30 ENCOUNTER — Other Ambulatory Visit: Payer: Self-pay

## 2018-12-03 ENCOUNTER — Telehealth: Payer: Self-pay | Admitting: Neurology

## 2018-12-03 NOTE — Telephone Encounter (Signed)
[name] labs rec'd dated 11/18/2018 for Sensorimotor Neuropathy panel and is negative (specifically negative for anti-MAG, anti-SGPG).  Brittney Greenawalt K. Posey Pronto, DO

## 2018-12-04 ENCOUNTER — Encounter: Payer: Self-pay | Admitting: *Deleted

## 2018-12-04 NOTE — Telephone Encounter (Signed)
Sample received on 11/25/2018 and is still processing.

## 2018-12-08 NOTE — Telephone Encounter (Signed)
Patient is coming in tomorrow at 7:50 AM. Thanks

## 2018-12-08 NOTE — Telephone Encounter (Signed)
Noted  

## 2018-12-08 NOTE — Progress Notes (Signed)
Follow-up Visit   Date: 12/09/18    Brittney [name] MRN: [id number] DOB: [birth year]   Interim History: Brittney [name] is a 48 y.o. right-handed African American female with hypertension and GERD returning to the clinic for follow-up of sensory neuropathy.  The patient was accompanied to the clinic by self.  History of present illness: Brittney [name] first starting noticing numbness of the hands in 2015 and was recommended by her PCP, [name], to have NCS/EMG, but she felt it was due to carpal tunnel syndrome and did not pursue testing.  In 2018, she began having numbness of the left leg and both feet. Since August 2019, her numbness intensified over the forearm, hands, and feet, and the entire left leg. Her left leg numbness bothers her the most.  She has weakness of the arms and legs, especially on the left side.  She has noticed that even with exercising and lifting weights, she has to reduce her weight on the left side.  She has some imbalance and feels lightheaded.  She has not suffered any falls, but tends to trip often.  She occasionally has stabbing pain, like an ice-pick about 3-4 times per month, which is very brief.  No burning or stinging pain of the hands or feet.  She is more aware of numbness when she is resting and feet feel "hot" at night.    She saw [name] for these symptoms in early October 2019, who ordered electrodiagnostic and laboratory testing. 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  Labs revealed elevated ANA titer 1:160 (centromere pattern) and elevated SSA 1.3 - no signs of vitamin deficiency or paraproteinemia. She is scheduled to see [name] in rheumology next week.   She endorses dry mouth.  No dry eyes or dry skin. She complains of joint pain and popping.  She also feels that the bones in her back could "pop".   No changes in sweating pattern.  No early satiety.  She had gestational diabetes.   Her mother hand paresthesias of the hands, no formal diagnosis of neuropathy.  She drinks socially, several times per month.  No history of heavy alcohol use.   UPDATE 11/23/2018:  She is here for follow-up visit.  She has noticed mild progression of hand weakness and numbness since her last visit.  She continues to have numbness of the feet, especially on the left.  She has mild imbalance, no falls.  She does not have weakness of the legs.  Brittney [name] autoimmune sensory panel is pending.   She was evaluated by [name] and diagnosed with mild Sjrogren's disease.  Her mother had CHF and possible neuropathy.   UPDATE 12/09/2018: She is here for follow-up visit to discuss the results of her laboratory testing.  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  She does have some weakness of the hands.  She continues to have numbness of the legs, no significant weakness in the legs or imbalance.   Brittney [name] Sensorimotor Neuropathy panel and is negative (specifically negative for anti-MAG, anti-SGPG).  Her maternal aunt had history stroke and neuropathy. Her mother may have had neuropathy but was never formally tested.  She has two daughters, one of which has intermittent spells of numbness of the legs.    Medications:  Current Outpatient Medications on File Prior to Visit  Medication Sig Dispense  Refill  . BIOTIN PO Take by mouth daily.    . cholecalciferol (VITAMIN D) 1000 units tablet Take 2,000 Units by mouth daily.    Marland Kitchen loratadine (CLARITIN) 10 MG tablet Take 10 mg by mouth daily.    Marland Kitchen losartan (COZAAR) 100 MG tablet TAKE 1 TABLET BY MOUTH EVERY DAY 90 tablet 3  . Multiple Vitamins-Minerals (MULTIVITAMIN PO) Take by mouth daily.    Marland Kitchen omeprazole (PRILOSEC) 20 MG capsule Take 20 mg by mouth as needed.      No current facility-administered medications on file prior to visit.      Allergies:  Allergies  Allergen Reactions  . Miralax [Polyethylene Glycol] Swelling    Tongue swelling  . Vasotec [Enalapril]     cough    Review of Systems:  CONSTITUTIONAL: No fevers, chills, night sweats, or weight loss.  EYES: No visual changes or eye pain ENT: No hearing changes.  No history of nose bleeds.   RESPIRATORY: No cough, wheezing and shortness of breath.   CARDIOVASCULAR: Negative for chest pain, and palpitations.   GI: Negative for abdominal discomfort, blood in stools or black stools.  No recent change in bowel habits.   GU:  No history of incontinence.   MUSCLOSKELETAL: No history of joint pain or swelling.  No myalgias.   SKIN: Negative for lesions, rash, and itching.   ENDOCRINE: Negative for cold or heat intolerance, polydipsia or goiter.   PSYCH:  No depression or anxiety symptoms.   NEURO: As Above.   Vital Signs:  BP 118/84   Pulse 64   Ht 4' 11.5" (1.511 m)   Wt 172 lb (78 kg)   SpO2 99%   BMI 34.16 kg/m     General Medical Exam:   General:  Well appearing, comfortable  Eyes/ENT: see cranial nerve examination.   Neck: No masses appreciated.  Full range of motion without tenderness.  No carotid bruits. Respiratory:  Clear to auscultation, good air entry bilaterally.   Cardiac:  Regular rate and rhythm, no murmur.   Ext:  No edema, no pes cavus or hammer toes  Neurological Exam: MENTAL STATUS including orientation to time, place, person, recent and remote memory, attention span and concentration, language, and fund of knowledge is normal.  Speech is not dysarthric.  CRANIAL NERVES:  Anisocoria with left pupil larger than right, both are reactive to light.  No ptosis.  Normal conjugate, extra-ocular eye movements in all directions of gaze.  Face is symmetric.   MOTOR:  Motor strength is 5/5 in all extremities, except mild weakness of hand interosseus muscles 4/5 and toe extension/flexion.  No atrophy, fasciculations or abnormal movements.       MSRs:  Reflexes are 2+/4 throughout, except 1+ triceps bilaterally.  SENSORY:   Vibration is reduced at the right MCP and feet mildly.     COORDINATION/GAIT:   Gait narrow based and stable, impressive because she is wearing about 3inch heels.    Data: MRI brain 07/30/2018:  Normal noncontrast MRI appearance of the brain aside from partially empty sella, which can be a normal anatomic variant but is also associated with idiopathic intracranial hypertension (pseudotumor cerebri).  CSF opening pressure measurement would evaluate further.  NCS/EMG of the arms 10/06/2018:  The electrophysiologic findings are most consistent with a severe sensorimotor axonal and demyelinating polyneuropathy affecting the upper extremities. NCS/EMG of the legs 09/24/2018:  The electrophysiologic findings are most consistent with a chronic sensorimotor polyneuropathy, axonal loss and demyelinating and type, affecting bilateral  lower extremities.  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  Electrodiagnostic testing confirmed the presence of sensory demyelinating and axonal neuropathy.  Prior work-up has included serology testing, including autoimmune sensory panel and CSF analysis which has essentially returned normal.  She was found to have Sjogren's disease, however this is too mild to manifest with the degree of her neuropathy.   I discussed the diagnosis of Charcot-Marie-Tooth and informed her that this is a autosomal dominant condition, therefore there is a 50% chance of passing the gene to her children. She  mentions that her younger daughter (age 49 has some episodes of leg numbness.   Unfortunately, there is no treatment for hereditary neuropathy and management is supportive. For balance and weakness, occupational and physical therapy can be considered if symptoms progress. Genetic counseling services are available through [facility] and information was provided Resources were provided for additional information  Return to clinic in 3 months  Greater than 50% of this 40 minute visit was spent in counseling, explanation of diagnosis, planning of further management, and coordination of care.    Thank you for allowing me to participate in patient's care.  If I can answer any additional questions, I would be pleased to do so.    Sincerely,    Tinzlee Craker K. PPosey Pronto DO

## 2018-12-08 NOTE — Telephone Encounter (Signed)
Please contact patient to schedule follow-up visit to go over her results.  Thanks.  Donika K. Posey Pronto, DO

## 2018-12-09 ENCOUNTER — Encounter: Payer: Self-pay | Admitting: Neurology

## 2018-12-09 ENCOUNTER — Ambulatory Visit: Payer: 59 | Admitting: Neurology

## 2018-12-09 VITALS — BP 118/84 | HR 64 | Ht 59.5 in | Wt 172.0 lb

## 2018-12-09 DIAGNOSIS — G600 Hereditary motor and sensory neuropathy: Secondary | ICD-10-CM

## 2018-12-09 NOTE — Patient Instructions (Addendum)
[email or web address]  [email or web address]  You can try over the counter lidocaine cream to your feet as needed   Start exercise program, especially strengthening your hands and feet  Return to clinic in 3 months

## 2018-12-16 DIAGNOSIS — Z029 Encounter for administrative examinations, unspecified: Secondary | ICD-10-CM

## 2019-01-12 DIAGNOSIS — S233XXA Sprain of ligaments of thoracic spine, initial encounter: Secondary | ICD-10-CM | POA: Diagnosis not present

## 2019-01-12 DIAGNOSIS — S134XXA Sprain of ligaments of cervical spine, initial encounter: Secondary | ICD-10-CM | POA: Diagnosis not present

## 2019-01-12 DIAGNOSIS — S39012A Strain of muscle, fascia and tendon of lower back, initial encounter: Secondary | ICD-10-CM | POA: Diagnosis not present

## 2019-01-21 DIAGNOSIS — S39012A Strain of muscle, fascia and tendon of lower back, initial encounter: Secondary | ICD-10-CM | POA: Diagnosis not present

## 2019-01-21 DIAGNOSIS — S134XXA Sprain of ligaments of cervical spine, initial encounter: Secondary | ICD-10-CM | POA: Diagnosis not present

## 2019-01-21 DIAGNOSIS — S233XXA Sprain of ligaments of thoracic spine, initial encounter: Secondary | ICD-10-CM | POA: Diagnosis not present

## 2019-01-28 DIAGNOSIS — S39012A Strain of muscle, fascia and tendon of lower back, initial encounter: Secondary | ICD-10-CM | POA: Diagnosis not present

## 2019-01-28 DIAGNOSIS — S134XXA Sprain of ligaments of cervical spine, initial encounter: Secondary | ICD-10-CM | POA: Diagnosis not present

## 2019-01-28 DIAGNOSIS — S233XXA Sprain of ligaments of thoracic spine, initial encounter: Secondary | ICD-10-CM | POA: Diagnosis not present

## 2019-02-04 DIAGNOSIS — S134XXA Sprain of ligaments of cervical spine, initial encounter: Secondary | ICD-10-CM | POA: Diagnosis not present

## 2019-02-04 DIAGNOSIS — S39012A Strain of muscle, fascia and tendon of lower back, initial encounter: Secondary | ICD-10-CM | POA: Diagnosis not present

## 2019-02-04 DIAGNOSIS — S233XXA Sprain of ligaments of thoracic spine, initial encounter: Secondary | ICD-10-CM | POA: Diagnosis not present

## 2019-02-05 NOTE — Progress Notes (Deleted)
Office Visit Note  Patient: Brittney [name]             Date of Birth: [birth year]           MRN: [id number]             PCP: Kathyrn Drown, MD Referring: Kathyrn Drown, MD Visit Date: 02/19/2019 Occupation: @GUAROCC @  Subjective:  No chief complaint on file.   History of Present Illness: Brittney [name] is a 48 y.o. female ***   Activities of Daily Living:  Patient reports morning stiffness for *** {minute/hour:19697}.   Patient {ACTIONS;DENIES/REPORTS:21021675::"Denies"} nocturnal pain.  Difficulty dressing/grooming: {ACTIONS;DENIES/REPORTS:21021675::"Denies"} Difficulty climbing stairs: {ACTIONS;DENIES/REPORTS:21021675::"Denies"} Difficulty getting out of chair: {ACTIONS;DENIES/REPORTS:21021675::"Denies"} Difficulty using hands for taps, buttons, cutlery, and/or writing: {ACTIONS;DENIES/REPORTS:21021675::"Denies"}  No Rheumatology ROS completed.   PMFS History:  Patient Active Problem List   Diagnosis Date Noted  . History of gastroesophageal reflux (GERD) 11/18/2018  . History of IBS 11/18/2018  . 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 Ulnar neuropathy of both upper extremities 11/04/2018  . 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 Essential hypertension, benign 07/12/2014  . Paresthesia of hand, bilateral 02/03/2014  . Unspecified constipation 02/09/2013  . Fatigue 01/03/2010  . Morbid obesity ([place]) 06/22/2009  . ALLERGIC RHINITIS, SEASONAL 06/22/2009  . IBS 06/22/2009  . CHEST PAIN 06/22/2009    Past Medical History:  Diagnosis Date  . Acid reflux   . Chest pain    intermittent x 1 yr  . Diabetic mellitus, gestational   . Hypertension    during pregnancy. Not treated for it now.  . IBS (irritable bowel syndrome)    dx in 2005 by primary MD  . IBS (irritable bowel syndrome)   . Obesity   . Seasonal allergies    yr roud; was immunothrapy in childhood, does not need any more at  this time    Family History  Problem Relation Age of Onset  . Kidney failure Mother   . Cancer Father 88       colon cancer  . Healthy Sister   . Healthy Brother   . Cancer Brother   . Healthy Sister   . Eczema Daughter   . Allergies Daughter   . Eczema Son   . Allergies Son    Past Surgical History:  Procedure Laterality Date  . CESAREAN SECTION W/BTL  2007  . cholectystectomy  2000  . CHOLECYSTECTOMY  2000  . COLONOSCOPY N/A 02/19/2013   Procedure: COLONOSCOPY;  Surgeon: Rogene Houston, MD;  Location: [facility];  Service: Endoscopy;  Laterality: N/A;  35   Social History   Social History Narrative   Married 14 years, 2 children ages 59 and 41.    Job: Pt accounting   Gets regular exercise.    Immunization History  Administered Date(s) Administered  . Influenza-Unspecified 09/17/2017     Objective: Vital Signs: There were no vitals taken for this visit.   Physical Exam   Musculoskeletal Exam: ***  CDAI Exam: CDAI Score: Not documented Patient Global Assessment: Not documented; Provider Global Assessment: Not documented Swollen: Not documented; Tender: Not documented Joint Exam   Not documented   There is currently no information documented on the homunculus. Go to the Rheumatology activity and complete the homunculus joint exam.  Investigation: No additional findings.  Imaging: No results found.  Recent Labs: Lab Results  Component Value Date   WBC 5.0 10/28/2018   HGB 12.0 10/28/2018  PLT 325 10/28/2018   NA 137 10/28/2018   K 3.8 10/28/2018   CL 99 10/28/2018   CO2 30 10/28/2018   GLUCOSE 83 10/28/2018   BUN 8 10/28/2018   CREATININE 0.61 10/28/2018   BILITOT 0.5 10/28/2018   ALKPHOS 53 10/01/2017   AST 18 10/28/2018   ALT 11 10/28/2018   PROT 7.4 10/28/2018   ALBUMIN 4.3 10/13/2018   CALCIUM 9.8 10/28/2018   GFRAA 124 10/28/2018    Speciality Comments: No specialty comments available.  Procedures:  No procedures  performed Allergies: Miralax [polyethylene glycol] and Vasotec [enalapril]   Assessment / Plan:     Visit Diagnoses: Sjogren's syndrome with keratoconjunctivitis sicca ([place]) - History of dry mouth and dry eyes.  ANA 1: 160 nuclear and centromere pattern, positive anti-Ro antibody.  Rest of the ENA negative  Mixed axonal-demyelinating neuropathy  Ulnar neuropathy of both upper extremities  Hypermobility arthralgia  Essential hypertension, benign  History of gastroesophageal reflux (GERD)  History of IBS  Neuropathy   Orders: No orders of the defined types were placed in this encounter.  No orders of the defined types were placed in this encounter.   Face-to-face time spent with patient was *** minutes. Greater than 50% of time was spent in counseling and coordination of care.  Follow-Up Instructions: No follow-ups on file.   Ofilia Neas, PA-C  Note - This record has been created using Dragon software.  Chart creation errors have been sought, but may not always  have been located. Such creation errors do not reflect on  the standard of medical care.

## 2019-02-11 DIAGNOSIS — S233XXA Sprain of ligaments of thoracic spine, initial encounter: Secondary | ICD-10-CM | POA: Diagnosis not present

## 2019-02-11 DIAGNOSIS — S134XXA Sprain of ligaments of cervical spine, initial encounter: Secondary | ICD-10-CM | POA: Diagnosis not present

## 2019-02-11 DIAGNOSIS — S39012A Strain of muscle, fascia and tendon of lower back, initial encounter: Secondary | ICD-10-CM | POA: Diagnosis not present

## 2019-02-12 DIAGNOSIS — M79672 Pain in left foot: Secondary | ICD-10-CM | POA: Diagnosis not present

## 2019-02-12 DIAGNOSIS — M79671 Pain in right foot: Secondary | ICD-10-CM | POA: Diagnosis not present

## 2019-02-18 DIAGNOSIS — S39012A Strain of muscle, fascia and tendon of lower back, initial encounter: Secondary | ICD-10-CM | POA: Diagnosis not present

## 2019-02-18 DIAGNOSIS — S134XXA Sprain of ligaments of cervical spine, initial encounter: Secondary | ICD-10-CM | POA: Diagnosis not present

## 2019-02-18 DIAGNOSIS — S233XXA Sprain of ligaments of thoracic spine, initial encounter: Secondary | ICD-10-CM | POA: Diagnosis not present

## 2019-02-19 ENCOUNTER — Ambulatory Visit: Payer: 59 | Admitting: Physician Assistant

## 2019-03-19 ENCOUNTER — Ambulatory Visit: Payer: 59 | Admitting: Neurology

## 2019-04-02 ENCOUNTER — Telehealth: Payer: Self-pay | Admitting: Neurology

## 2019-04-02 NOTE — Telephone Encounter (Signed)
Patient left VM with after hours saying that she was returning your call. Thanks!

## 2019-04-06 ENCOUNTER — Other Ambulatory Visit: Payer: Self-pay

## 2019-04-06 ENCOUNTER — Telehealth (INDEPENDENT_AMBULATORY_CARE_PROVIDER_SITE_OTHER): Payer: 59 | Admitting: Neurology

## 2019-04-06 VITALS — Ht 59.5 in | Wt 167.0 lb

## 2019-04-06 DIAGNOSIS — G600 Hereditary motor and sensory neuropathy: Secondary | ICD-10-CM

## 2019-04-06 DIAGNOSIS — R252 Cramp and spasm: Secondary | ICD-10-CM

## 2019-04-06 NOTE — Progress Notes (Signed)
Virtual Visit via Video Note The purpose of this virtual visit is to provide medical care while limiting exposure to the novel coronavirus.    Consent was obtained for video visit:  Yes.   Answered questions that patient had about telehealth interaction:  Yes.   I discussed the limitations, risks, security and privacy concerns of performing an evaluation and management service by telemedicine. I also discussed with the patient that there may be a patient responsible charge related to this service. The patient expressed understanding and agreed to proceed.  Pt location: Home Physician Location: office Name of referring provider:  Kathyrn Drown, MD I connected with Lendell Caprice at patients initiation/request on 04/06/2019 at  9:00 AM EDT by video enabled telemedicine application and verified that I am speaking with the correct person using two identifiers. Pt MRN:  [id number] Pt DOB:  13-Dec-1970 Video Participants:  Lendell Caprice   History of Present Illness: This is a 48 y.o. female returning for follow-up of Charcot-Marie-Tooth disease type IA. Over the past few weeks, she has noticed increased burning pain, numbness, and tingling in the legs and feet.  She also is noticing greater difficulty with holding objects and picking up items with her hands.  She is also having increased bruises and cuts on the hands, because of not inability to feel pain.  Last week, she had a particularly bad week in the sense that her symptoms are now more noticeable.  She is also concerned that there is slight muscle loss in her cough muscles.  She stays very active exercising 5 times per week and began having the leg cramps after a leg workout.  She stays very well-hydrated with water, drinking up to 1 gallon daily.  Her mood is doing well and she continues to work at the [employer] center.  She has no problems with typing or keeping up with work duties.  She has also noticed increased stiffness and swelling of the  hands and feet.   Observations/Objective:   Vitals:   04/06/19 0852  Weight: 167 lb (75.8 kg)  Height: 4' 11.5" (1.511 m)   Patient is awake, alert, and appears comfortable.  Oriented x 4.   Extraocular muscles are intact. No ptosis.  Face is symmetric.  Speech is not dysarthric. Tongue is midline. Antigravity in all extremities.  There is mild weakness with grip strength, worse on the left, when she attempts to squeeze and pull on 2 fingers of the opposite hand.  No pronator drift.   Assessment and Plan:  1.  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  She is also starting to have weakness in the hands and feet.  I offered lidocaine ointment, which is over-the-counter, when her pain is severe. She does not wish to be on oral medications for painful paresthesias at this time.  She was encouraged to continue with hand and leg strengthening exercises.  Lifestyle modification such as using rubber gloves when washing dishes, using handles could have a larger diameter, and daily inspection of the hands and feet was discussed.    2.  Muscle cramps.  She was encouraged to start drinking electrolyte water when exercising.  Other options include tonic water (2-3 glasses) and over-the-counter magnesium oxide 400-600 mg.     Follow Up Instructions:   I discussed the assessment and treatment plan with the patient. The patient was provided an opportunity to ask questions and all were answered. The patient agreed  with the plan and demonstrated an understanding of the instructions.   The patient was advised to call back or seek an in-person evaluation if the symptoms worsen or if the condition fails to improve as anticipated.  Follow-up in 9 months or sooner as needed  Total time spent:  30 minutes     Alda Berthold, DO

## 2019-04-20 IMAGING — XA DG FLUORO GUIDE NDL PLC/BX
2 series · 2 of 2 positions shown · non-contrast
Comparison: none

CLINICAL DATA: Severe sensorimotor axonal and demyelinating
polyneuropathy effecting upper extremities. MR brain suggests no
central demyelinating disease.

[Series 2: ortho standard · 1 of 1 slices shown (1 of 2)]
[im 1/1]
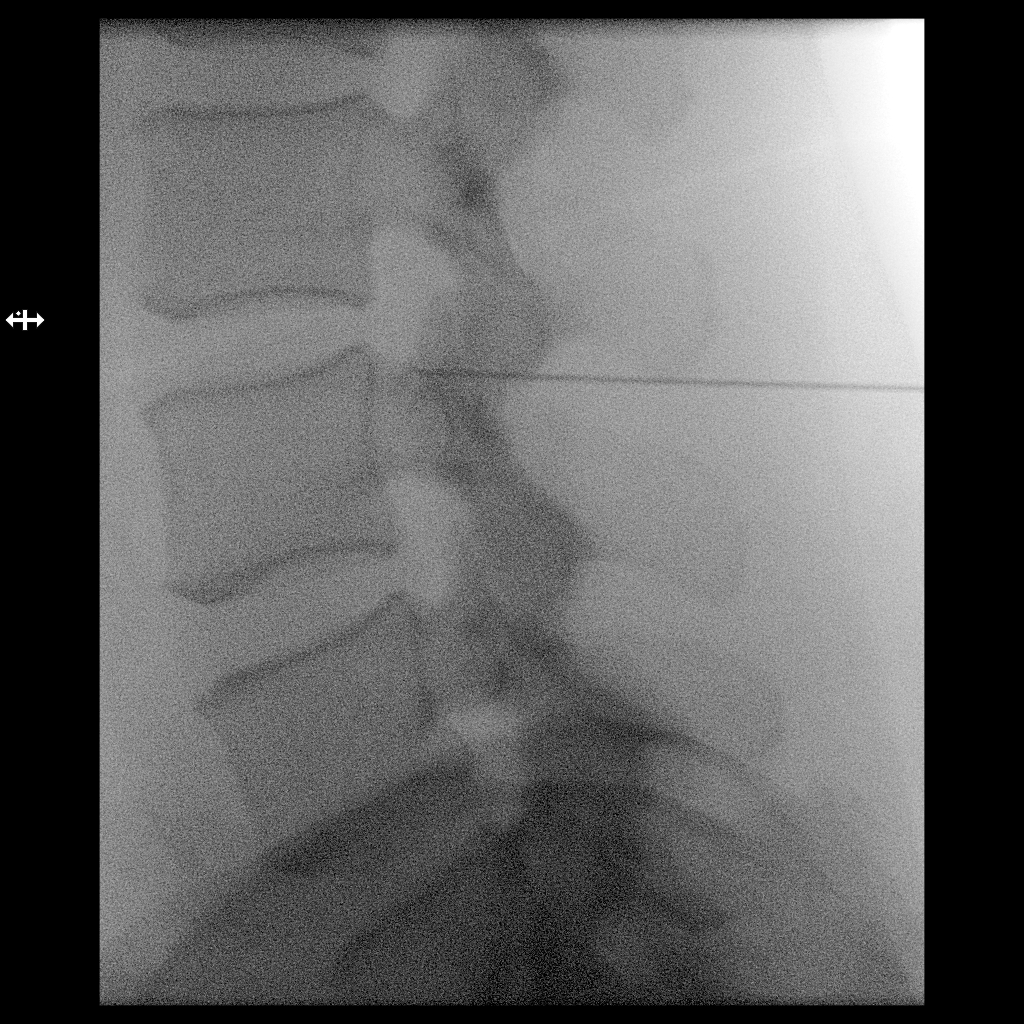

[Series 3: ortho standard · 1 of 1 slices shown (2 of 2)]
[im 1/1]
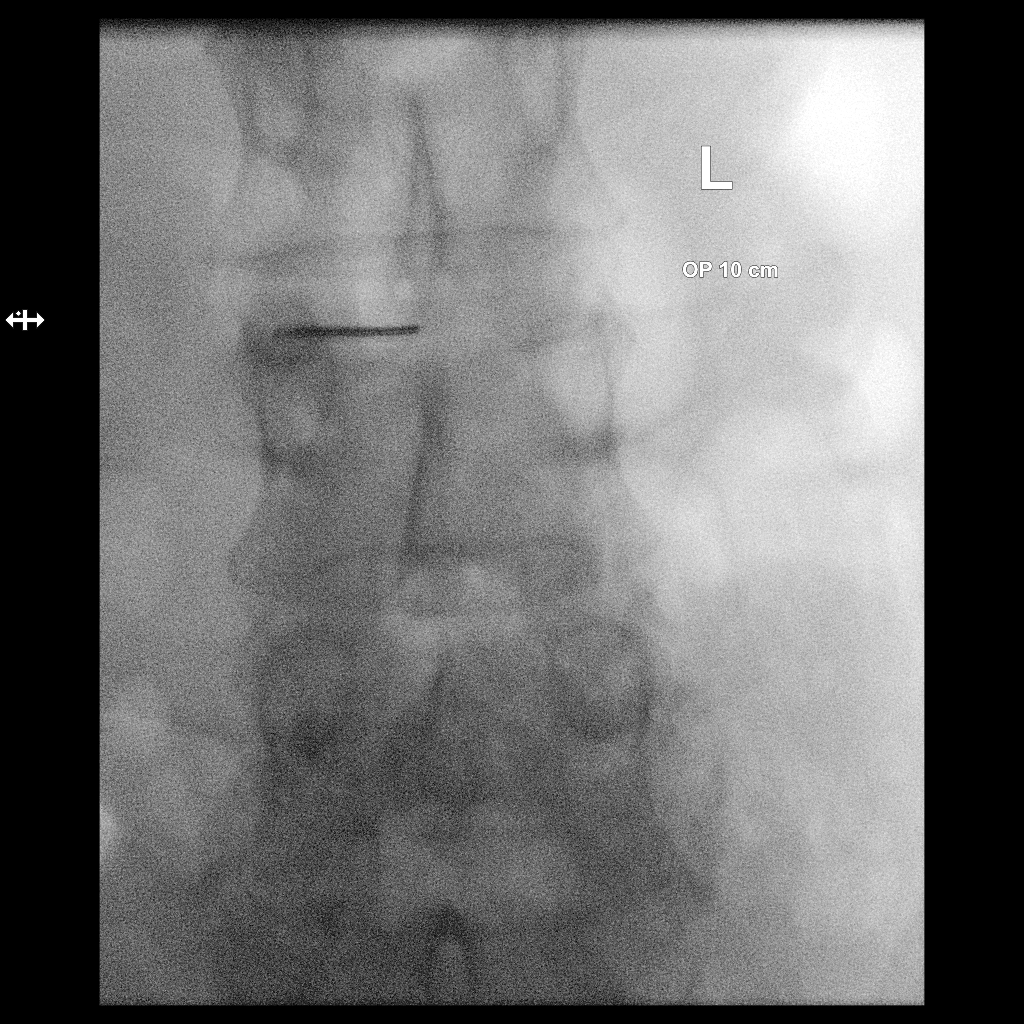

[2 of 2 positions shown; findings below may reference images not displayed]

EXAM:
DIAGNOSTIC LUMBAR PUNCTURE UNDER FLUOROSCOPIC GUIDANCE

FLUOROSCOPY TIME:  30 seconds corresponding to a Dose Area Product
of 58.14 Gy*m2

PROCEDURE:
Informed consent was obtained from the patient prior to the
procedure, including potential complications of headache, allergy,
and pain. Time-out was performed.

With the patient lateral decubitus, the lower back was prepped with
Betadine. 1% Lidocaine was used for local anesthesia. Lumbar
puncture was performed at the L3-L4 level using a 20 gauge needle
with return of clear CSF with an opening pressure of 10.0 cm water.
Approximately 18 ml of CSF were obtained for laboratory studies. The
patient tolerated the procedure well and there were no apparent
complications.

Patient was given instructions for bed rest for 24 hours.
IMPRESSION: Technically successful diagnostic lumbar puncture as described.
Normal opening pressure with clear colorless fluid.

## 2019-06-18 ENCOUNTER — Encounter: Payer: Self-pay | Admitting: Rheumatology

## 2019-06-18 NOTE — Telephone Encounter (Signed)
Please schedule an appointment.

## 2019-06-24 NOTE — Progress Notes (Signed)
Office Visit Note  Patient: Brittney [name]             Date of Birth: 1971-04-15           MRN: [id number]             PCP: Kathyrn Drown, MD Referring: Kathyrn Drown, MD Visit Date: 07/08/2019 Occupation: @GUAROCC @  Subjective:  Pain in multiple joints   History of Present Illness: Brittney [name] is a 48 y.o. female with history of Sjogren's syndrome.  She continues to have chronic sicca symptoms.  She has been having increased eye dryness recently and has not been using any eyedrops.  She continues to use Biotene toothpaste which does help with her mouth dryness.  She reports that she has been having increased pain in multiple joints including bilateral hands, bilateral elbows, bilateral knees, bilateral hip joints.  She has noticed increased morning stiffness and intermittent joint swelling in both hands.  She has issues recently diagnosed with Charcot's Marie tooth disease type IA by Dr. Posey Pronto.   Activities of Daily Living:  Patient reports morning stiffness for 30-60 minutes.   Patient Reports nocturnal pain.  Difficulty dressing/grooming: Denies Difficulty climbing stairs: Denies Difficulty getting out of chair: Denies Difficulty using hands for taps, buttons, cutlery, and/or writing: Reports  Review of Systems  Constitutional: Positive for fatigue.  HENT: Positive for mouth dryness and nose dryness. Negative for mouth sores.   Eyes: Positive for dryness. Negative for pain and visual disturbance.  Respiratory: Negative for cough, hemoptysis, shortness of breath, wheezing and difficulty breathing.   Cardiovascular: Negative for chest pain, palpitations, hypertension and swelling in legs/feet.  Gastrointestinal: Positive for constipation. Negative for abdominal pain, blood in stool and diarrhea.  Endocrine: Negative for increased urination.  Genitourinary: Negative for painful urination.  Musculoskeletal: Positive for arthralgias, joint pain, joint swelling and morning  stiffness. Negative for myalgias, muscle weakness, muscle tenderness and myalgias.  Skin: Positive for hair loss. Negative for color change, pallor, rash, nodules/bumps, redness, skin tightness, ulcers and sensitivity to sunlight.  Allergic/Immunologic: Negative for susceptible to infections.  Neurological: Negative for numbness.  Hematological: Negative for bruising/bleeding tendency and swollen glands.  Psychiatric/Behavioral: Positive for depressed mood and sleep disturbance. Negative for confusion. The patient is nervous/anxious.     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 Not treated for it now.   IBS (irritable bowel syndrome)    dx in 2005 by primary MD   IBS (irritable bowel syndrome)    Obesity    Seasonal allergies    yr roud; was immunothrapy in childhood, does not need any more at this time    Family History  Problem Relation Age of Onset   Kidney failure Mother  Cancer Father 25       colon cancer   Healthy Sister    Healthy Brother    Cancer Brother    Healthy Sister    Eczema Daughter    Allergies Daughter    Eczema Son    Allergies Son    Past Surgical History:  Procedure  Laterality Date   CESAREAN SECTION W/BTL  2007   cholectystectomy  2000   CHOLECYSTECTOMY  2000   COLONOSCOPY N/A 02/19/2013   Procedure: COLONOSCOPY;  Surgeon: Rogene Houston, MD;  Location: [facility];  Service: Endoscopy;  Laterality: N/A;  28   Social History   Social History Narrative   Married 14 years, 2 children ages 46 and 52.    Job: Pt accounting   Gets regular exercise.    Immunization History  Administered Date(s) Administered   Influenza-Unspecified 09/17/2017     Objective: Vital Signs: BP 120/79 (BP Location: Left Arm, Patient Position: Sitting, Cuff Size: Normal)    Pulse 64    Resp 13    Ht 4' 11.5" (1.511 m)    Wt 172 lb (78 kg)    BMI 34.16 kg/m    Physical Exam Vitals signs and nursing note reviewed.  Constitutional:      Appearance: She is well-developed.  HENT:     Head: Normocephalic and atraumatic.  Eyes:     Conjunctiva/sclera: Conjunctivae normal.  Neck:     Musculoskeletal: Normal range of motion.  Cardiovascular:     Rate and Rhythm: Normal rate and regular rhythm.     Heart sounds: Normal heart sounds.  Pulmonary:     Effort: Pulmonary effort is normal.     Breath sounds: Normal breath sounds.  Abdominal:     General: Bowel sounds are normal.     Palpations: Abdomen is soft.  Lymphadenopathy:     Cervical: No cervical adenopathy.  Skin:    General: Skin is warm and dry.     Capillary Refill: Capillary refill takes less than 2 seconds.  Neurological:     Mental Status: She is alert and oriented to person, place, and time.  Psychiatric:        Behavior: Behavior normal.      Musculoskeletal Exam: C-spine, thoracic spine, and lumbar spine good ROM.  No midline spinal tenderness.  No SI joint tenderness. Shoulder joints, elbow joints, wrist joints, MCPs, PIPs, and DIPs good ROM with no synovitis.  Complete fist formation bilaterally.  Hip joints, knee joints, ankle joints, MTPs, PIPs, and DIPs good ROM with no synovitis.  No  warmth or effusion of knee joints.  No tenderness or swelling of ankle joints.    CDAI Exam: CDAI Score: -- Patient Global: --; Provider Global: -- Swollen: --; Tender: -- Joint Exam   No joint exam has been documented for this visit   There is currently no information documented on the homunculus. Go to the Rheumatology activity and complete the homunculus joint exam.  Investigation: No additional findings.  Imaging: No results found.  Recent Labs: Lab Results  Component Value Date   WBC 5.0 10/28/2018   HGB 12.0 10/28/2018   PLT 325 10/28/2018   NA 137 10/28/2018   K 3.8 10/28/2018   CL 99 10/28/2018   CO2 30 10/28/2018   GLUCOSE 83 10/28/2018   BUN 8 10/28/2018   CREATININE 0.61 10/28/2018   BILITOT 0.5 10/28/2018   ALKPHOS 53 10/01/2017   AST 18 10/28/2018   ALT 11 10/28/2018  PROT 7.4 10/28/2018   ALBUMIN 4.3 10/13/2018   CALCIUM 9.8 10/28/2018   GFRAA 124 10/28/2018    Speciality Comments: No specialty comments available.  Procedures:  No procedures performed Allergies: Miralax [polyethylene glycol] and Vasotec [enalapril]   Assessment / Plan:     Visit Diagnoses: Sjogren's syndrome with keratoconjunctivitis sicca ([place]) - History of dry mouth and dry eyes.  ANA 1: 160 nuclear and centromere pattern, positive anti-Ro antibody.  Rest of the ENA negative: She continues to have chronic sicca symptoms.  She has been noticing increased eye dryness he does not use any eyedrops.  She does use Biotene toothpaste which has been helping with her mouth dryness.  We discussed characteristics of Sjogren's syndrome.  She has been having increased pain in multiple joints but no synovitis was noted on exam today.  We discussed trying Plaquenil to see if it improves her sicca symptoms and joint pain.  Indications, contraindications, potential side effects of Plaquenil were discussed.  She is in agreement to start.  Consent was obtained today.  CBC and CMP will be drawn today and  if labs are stable a prescription for Plaquenil will be sent to the pharmacy.  She will follow-up in the office in 3 months.  Polyarthralgia: She has been having increased pain in multiple joints including bilateral hands, bilateral elbows, bilateral hips, bilateral knee joints.  She said intermittent swelling in both hands and increased morning stiffness.  She had no synovitis on exam today.  We discussed starting her on Plaquenil due to history of Sjogren's syndrome.  Indications, the indications, potential side effects were discussed.  She is advised to notify us if she cannot tolerate Plaquenil.  CBC and CMP will be drawn today and if labs are stable a prescription for Plaquenil will be sent to the pharmacy.  She will follow-up in the office in 3 months.  High risk medication use -CBC and CMP will be drawn today prior to starting on Plaquenil. plan: CBC with Differential/Platelet, COMPLETE METABOLIC PANEL WITH GFR, CBC with Differential/Platelet, COMPLETE METABOLIC PANEL WITH GFR  Patient was counseled on the purpose, proper use, and adverse effects of hydroxychloroquine including nausea/diarrhea, skin rash, headaches, and sun sensitivity.  Discussed importance of annual eye exams while on hydroxychloroquine to monitor to ocular toxicity and discussed importance of frequent laboratory monitoring.  Provided patient with eye exam form for baseline ophthalmologic exam.  Provided patient with educational materials on hydroxychloroquine and answered all questions.  Patient consented to hydroxychloroquine.  Will upload consent in the media tab.     Prescription pending lab results.  Pain in both hands: She has been having increased pain in both hands for the past several weeks.  She has been having increased morning stiffness and intermittent joint swelling.  No synovitis was noted on exam.  X-rays of both hands were obtained today.  Chronic pain of both knees: She has been having increased pain in both  knee joints.  She has good ROM with no discomfort.  No warmth or effusion of knee joints.  X-rays of both knees were obtained today.   Charcot-Marie Tooth disease type IA: Diagnosed by Dr. Posey Pronto.  She experiences paresthesias of the hands and feet.  She declined oral medications for painful paresthesias in the past.  Dr. Posey Pronto recommended hand and leg strengthening exercises.  Ulnar neuropathy of both upper extremities   Hypermobility arthralgia  History of IBS   Essential hypertension, benign   History of gastroesophageal reflux (GERD)  Orders: Orders Placed This Encounter  Procedures   CBC with Differential/Platelet   COMPLETE METABOLIC PANEL WITH GFR   CBC with Differential/Platelet   COMPLETE METABOLIC PANEL WITH GFR   No orders of the defined types were placed in this encounter.   Face-to-face time spent with patient was 30 minutes. Greater than 50% of time was spent in counseling and coordination of care.  Follow-Up Instructions: Return in about 3 months (around 10/08/2019) for Sjogren's syndrome.   Ofilia Neas, PA-C  I examined and evaluated the patient with Brittney Sams PA.  Patient was complaining of increased joint pain today.  No synovitis was noted on my examination today.  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 The plan of care was discussed as noted above.  Bo Merino, MD  Note - This record has been created using Editor, commissioning.  Chart creation errors have been sought, but may not always  have been located. Such creation errors do not reflect on  the standard of medical care.

## 2019-07-08 ENCOUNTER — Ambulatory Visit: Payer: 59 | Admitting: Physician Assistant

## 2019-07-08 ENCOUNTER — Telehealth: Payer: Self-pay | Admitting: Pharmacist

## 2019-07-08 ENCOUNTER — Encounter: Payer: Self-pay | Admitting: Physician Assistant

## 2019-07-08 ENCOUNTER — Ambulatory Visit: Payer: Self-pay

## 2019-07-08 ENCOUNTER — Telehealth: Payer: Self-pay | Admitting: *Deleted

## 2019-07-08 ENCOUNTER — Other Ambulatory Visit: Payer: Self-pay

## 2019-07-08 VITALS — BP 120/79 | HR 64 | Resp 13 | Ht 59.5 in | Wt 172.0 lb

## 2019-07-08 DIAGNOSIS — M79641 Pain in right hand: Secondary | ICD-10-CM | POA: Diagnosis not present

## 2019-07-08 DIAGNOSIS — M79642 Pain in left hand: Secondary | ICD-10-CM | POA: Diagnosis not present

## 2019-07-08 DIAGNOSIS — G600 Hereditary motor and sensory neuropathy: Secondary | ICD-10-CM | POA: Diagnosis not present

## 2019-07-08 DIAGNOSIS — M2550 Pain in unspecified joint: Secondary | ICD-10-CM | POA: Diagnosis not present

## 2019-07-08 DIAGNOSIS — M25562 Pain in left knee: Secondary | ICD-10-CM

## 2019-07-08 DIAGNOSIS — Z79899 Other long term (current) drug therapy: Secondary | ICD-10-CM | POA: Diagnosis not present

## 2019-07-08 DIAGNOSIS — G6289 Other specified polyneuropathies: Secondary | ICD-10-CM

## 2019-07-08 DIAGNOSIS — M3501 Sicca syndrome with keratoconjunctivitis: Secondary | ICD-10-CM

## 2019-07-08 DIAGNOSIS — M25561 Pain in right knee: Secondary | ICD-10-CM

## 2019-07-08 DIAGNOSIS — G5623 Lesion of ulnar nerve, bilateral upper limbs: Secondary | ICD-10-CM

## 2019-07-08 DIAGNOSIS — G8929 Other chronic pain: Secondary | ICD-10-CM

## 2019-07-08 DIAGNOSIS — I10 Essential (primary) hypertension: Secondary | ICD-10-CM

## 2019-07-08 DIAGNOSIS — Z8719 Personal history of other diseases of the digestive system: Secondary | ICD-10-CM

## 2019-07-08 NOTE — Telephone Encounter (Signed)
Patient advised x-ray of hands show mild OA. Patient advised x-ray of knees show mild oa behind knee cap and chondromalacia patella. No inflammatory arthritic changes.

## 2019-07-08 NOTE — Progress Notes (Signed)
Pharmacy Note  Subjective: Patient presents today to the [facility] to see [name].  Patient seen by the pharmacist for counseling on hydroxychloroquine for  Sjogren's syndrome. She reports sicca symptoms and new arthralgias. She is naive to therapy.  Objective: CMP     Component Value Date/Time   NA 137 10/28/2018 1203   NA 139 10/01/2017 0932   K 3.8 10/28/2018 1203   CL 99 10/28/2018 1203   CO2 30 10/28/2018 1203   GLUCOSE 83 10/28/2018 1203   BUN 8 10/28/2018 1203   BUN 6 10/01/2017 0932   CREATININE 0.61 10/28/2018 1203   CALCIUM 9.8 10/28/2018 1203   PROT 7.4 10/28/2018 1203   PROT 7.5 10/01/2017 0932   ALBUMIN 4.3 10/13/2018 0915   AST 18 10/28/2018 1203   ALT 11 10/28/2018 1203   ALKPHOS 53 10/01/2017 0932   BILITOT 0.5 10/28/2018 1203   BILITOT 0.4 10/01/2017 0932   GFRNONAA 107 10/28/2018 1203   GFRAA 124 10/28/2018 1203    CBC    Component Value Date/Time   WBC 5.0 10/28/2018 1203   RBC 3.94 10/28/2018 1203   HGB 12.0 10/28/2018 1203   HGB 13.2 10/01/2017 0932   HCT 35.7 10/28/2018 1203   HCT 39.5 10/01/2017 0932   PLT 325 10/28/2018 1203   PLT 336 10/01/2017 0932   MCV 90.6 10/28/2018 1203   MCV 91 10/01/2017 0932   MCH 30.5 10/28/2018 1203   MCHC 33.6 10/28/2018 1203   RDW 12.6 10/28/2018 1203   RDW 14.2 10/01/2017 0932   LYMPHSABS 1,665 10/28/2018 1203   LYMPHSABS 1.8 10/01/2017 0932   EOSABS 50 10/28/2018 1203   EOSABS 0.1 10/01/2017 0932   BASOSABS 40 10/28/2018 1203   BASOSABS 0.0 10/01/2017 0932    Assessment/Plan: Patient was counseled on the purpose, proper use, and adverse effects of hydroxychloroquine including nausea/diarrhea, skin rash, headaches, and sun sensitivity.  Discussed importance of annual eye exams while on hydroxychloroquine to monitor to ocular toxicity and discussed importance of frequent laboratory monitoring.  Provided patient with eye exam form for baseline ophthalmologic exam and standing lab  instructions. She plans to get her labs at [facility] in [place].  Provided patient with educational materials on hydroxychloroquine and answered all questions.  Patient consented to hydroxychloroquine.  Will upload consent in the media tab.     Dose will be Plaquenil 200 mg twice daily Monday through Friday based on weight of 78 kg and height of 4\' 11" .  Prescription pending lab results.  Counseled on the purpose, proper use, and adverse effects of natural anti-inflammatories including upset stomach and increased bleeding risk.  Encouraged patient to add one medication at a time and to include on medication list to monitor for adverse effects and drug interactions.  Given educational handout with recommended doses.  All questions encouraged and answered.  Instructed patient to call with any further questions or concerns.  Mariella Saa, PharmD, [place] Pharmacist [phone]  07/08/2019 10:34 AM

## 2019-07-08 NOTE — Telephone Encounter (Signed)
Dose will be Plaquenil 200 mg twice daily Monday through Friday based on weight of 78 kg and height of 4\' 11" .  Prescription pending lab results.

## 2019-07-08 NOTE — Patient Instructions (Addendum)
Standing Labs We placed an order today for your standing lab work.    Please come back and get your standing labs in 1 month, 3 months, and then every 5 months.  We have open lab daily Monday through Thursday from 8:30-12:30 PM and 1:30-4:30 PM and Friday from 8:30-12:30 PM and 1:30 -4:00 PM at the office of Dr. Bo Merino.   You may experience shorter wait times on Monday and Friday afternoons. The office is located at [address], [place], [place], [place] [address] No appointment is necessary.   Labs are drawn by [facility].  You may receive a bill from [facility] for your lab work.  If you wish to have your labs drawn at another location, please call the office 24 hours in advance to send orders.  If you have any questions regarding directions or hours of operation,  please call [phone].   Just as a reminder please drink plenty of water prior to coming for your lab work. Thanks!  Hydroxychloroquine tablets What is this medicine? HYDROXYCHLOROQUINE (hye drox ee KLOR oh kwin) is used to treat rheumatoid arthritis and systemic lupus erythematosus. It is also used to treat malaria. This medicine may be used for other purposes; ask your health care provider or pharmacist if you have questions. COMMON BRAND NAME(S): Plaquenil, Quineprox What should I tell my health care provider before I take this medicine? They need to know if you have any of these conditions:  diabetes  eye disease, vision problems  G6PD deficiency  heart disease  history of irregular heartbeat  if you often drink alcohol  kidney disease  liver disease  porphyria  psoriasis  an unusual or allergic reaction to chloroquine, hydroxychloroquine, other medicines, foods, dyes, or preservatives  pregnant or trying to get pregnant  breast-feeding How should I use this medicine? Take this medicine by mouth with a glass of water. Follow the directions on the prescription label. Do not cut, crush or  chew this medicine. Swallow the tablets whole. Take this medicine with food. Avoid taking antacids within 4 hours of taking this medicine. It is best to separate these medicines by at least 4 hours. Take your medicine at regular intervals. Do not take it more often than directed. Take all of your medicine as directed even if you think you are better. Do not skip doses or stop your medicine early. Talk to your pediatrician regarding the use of this medicine in children. While this drug may be prescribed for selected conditions, precautions do apply. Overdosage: If you think you have taken too much of this medicine contact a poison control center or emergency room at once. NOTE: This medicine is only for you. Do not share this medicine with others. What if I miss a dose? If you miss a dose, take it as soon as you can. If it is almost time for your next dose, take only that dose. Do not take double or extra doses. What may interact with this medicine? Do not take this medicine with any of the following medications:  cisapride  dronedarone  pimozide  thioridazine This medicine may also interact with the following medications:  ampicillin  antacids  cimetidine  cyclosporine  digoxin  kaolin  medicines for diabetes, like insulin, glipizide, glyburide  medicines for seizures like carbamazepine, phenobarbital, phenytoin  mefloquine  methotrexate  other medicines that prolong the QT interval (cause an abnormal heart rhythm)  praziquantel This list may not describe all possible interactions. Give your health care provider  a list of all the medicines, herbs, non-prescription drugs, or dietary supplements you use. Also tell them if you smoke, drink alcohol, or use illegal drugs. Some items may interact with your medicine. What should I watch for while using this medicine? Visit your health care professional for regular checks on your progress. Tell your health care professional if your  symptoms do not start to get better or if they get worse. You may need blood work done while you are taking this medicine. If you take other medicines that can affect heart rhythm, you may need more testing. Talk to your health care professional if you have questions. Your vision may be tested before and during use of this medicine. Tell your health care professional right away if you have any change in your eyesight. What side effects may I notice from receiving this medicine? Side effects that you should report to your doctor or health care professional as soon as possible:  allergic reactions like skin rash, itching or hives, swelling of the face, lips, or tongue  changes in vision  decreased hearing or ringing of the ears  muscle weakness  redness, blistering, peeling or loosening of the skin, including inside the mouth  sensitivity to light  signs and symptoms of a dangerous change in heartbeat or heart rhythm like chest pain; dizziness; fast or irregular heartbeat; palpitations; feeling faint or lightheaded, falls; breathing problems  signs and symptoms of liver injury like dark yellow or brown urine; general ill feeling or flu-like symptoms; light-colored stools; loss of appetite; nausea; right upper belly pain; unusually weak or tired; yellowing of the eyes or skin  signs and symptoms of low blood sugar such as feeling anxious; confusion; dizziness; increased hunger; unusually weak or tired; sweating; shakiness; cold; irritable; headache; blurred vision; fast heartbeat; loss of consciousness  suicidal thoughts  uncontrollable head, mouth, neck, arm, or leg movements Side effects that usually do not require medical attention (report to your doctor or health care professional if they continue or are bothersome):  diarrhea  dizziness  hair loss  headache  irritable  loss of appetite  nausea, vomiting  stomach pain This list may not describe all possible side effects.  Call your doctor for medical advice about side effects. You may report side effects to FDA at 1-800-FDA-1088. Where should I keep my medicine? Keep out of the reach of children. Store at room temperature between 15 and 30 degrees C (59 and 86 degrees F). Protect from moisture and light. Throw away any unused medicine after the expiration date. NOTE: This sheet is a summary. It may not cover all possible information. If you have questions about this medicine, talk to your doctor, pharmacist, or health care provider.  2020 Elsevier/Gold Standard (2019-03-29 12:56:32)

## 2019-07-09 LAB — COMPLETE METABOLIC PANEL WITH GFR
AG Ratio: 1.7 (calc) (ref 1.0–2.5)
ALT: 13 U/L (ref 6–29)
AST: 22 U/L (ref 10–35)
Albumin: 4.7 g/dL (ref 3.6–5.1)
Alkaline phosphatase (APISO): 35 U/L (ref 31–125)
BUN: 8 mg/dL (ref 7–25)
CO2: 26 mmol/L (ref 20–32)
Calcium: 9.9 mg/dL (ref 8.6–10.2)
Chloride: 101 mmol/L (ref 98–110)
Creat: 0.71 mg/dL (ref 0.50–1.10)
GFR, Est African American: 116 mL/min/{1.73_m2} (ref >=60)
GFR, Est Non African American: 100 mL/min/{1.73_m2} (ref >=60)
Globulin: 2.8 g/dL (calc) (ref 1.9–3.7)
Glucose, Bld: 86 mg/dL (ref 65–99)
Potassium: 4.7 mmol/L (ref 3.5–5.3)
Sodium: 138 mmol/L (ref 135–146)
Total Bilirubin: 0.7 mg/dL (ref 0.2–1.2)
Total Protein: 7.5 g/dL (ref 6.1–8.1)

## 2019-07-09 LAB — CBC WITH DIFFERENTIAL/PLATELET
Absolute Monocytes: 346 cells/uL (ref 200–950)
Basophils Absolute: 38 cells/uL (ref 0–200)
Basophils Relative: 0.8 %
Eosinophils Absolute: 29 cells/uL (ref 15–500)
Eosinophils Relative: 0.6 %
HCT: 38.7 % (ref 35.0–45.0)
Hemoglobin: 13 g/dL (ref 11.7–15.5)
Lymphs Abs: 1704 cells/uL (ref 850–3900)
MCH: 31.3 pg (ref 27.0–33.0)
MCHC: 33.6 g/dL (ref 32.0–36.0)
MCV: 93.3 fL (ref 80.0–100.0)
MPV: 10.5 fL (ref 7.5–12.5)
Monocytes Relative: 7.2 %
Neutro Abs: 2683 cells/uL (ref 1500–7800)
Neutrophils Relative %: 55.9 %
Platelets: 336 10*3/uL (ref 140–400)
RBC: 4.15 10*6/uL (ref 3.80–5.10)
RDW: 12.4 % (ref 11.0–15.0)
Total Lymphocyte: 35.5 %
WBC: 4.8 10*3/uL (ref 3.8–10.8)

## 2019-07-09 NOTE — Telephone Encounter (Signed)
Thank you for informing me.

## 2019-07-09 NOTE — Telephone Encounter (Signed)
Called patient to advised of lab results. Patient states she has changed her mind and does not want to take the medication right now.

## 2019-07-09 NOTE — Progress Notes (Signed)
CBC and CMP WNL

## 2019-09-10 ENCOUNTER — Encounter: Payer: Self-pay | Admitting: Neurology

## 2019-09-23 NOTE — Progress Notes (Deleted)
Office Visit Note  Patient: Brittney [name]             Date of Birth: [birth year]           MRN: [id number]             PCP: Kathyrn Drown, MD Referring: Kathyrn Drown, MD Visit Date: 10/07/2019 Occupation: @GUAROCC @  Subjective:  No chief complaint on file.   History of Present Illness: Brittney [name] is a 48 y.o. female ***   Activities of Daily Living:  Patient reports morning stiffness for *** {minute/hour:19697}.   Patient {ACTIONS;DENIES/REPORTS:21021675::"Denies"} nocturnal pain.  Difficulty dressing/grooming: {ACTIONS;DENIES/REPORTS:21021675::"Denies"} Difficulty climbing stairs: {ACTIONS;DENIES/REPORTS:21021675::"Denies"} Difficulty getting out of chair: {ACTIONS;DENIES/REPORTS:21021675::"Denies"} Difficulty using hands for taps, buttons, cutlery, and/or writing: {ACTIONS;DENIES/REPORTS:21021675::"Denies"}  No Rheumatology ROS completed.   PMFS History:  Patient Active Problem List   Diagnosis Date Noted  . History of gastroesophageal reflux (GERD) 11/18/2018  . History of IBS 11/18/2018  . Sjogren's syndrome with keratoconjunctivitis sicca ([place]) 11/04/2018  . Ulnar neuropathy of both upper extremities 11/04/2018  . 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 Essential hypertension, benign 07/12/2014  . Paresthesia of hand, bilateral 02/03/2014  . Unspecified constipation 02/09/2013  . Fatigue 01/03/2010  . Morbid obesity ([place]) 06/22/2009  . ALLERGIC RHINITIS, SEASONAL 06/22/2009  . IBS 06/22/2009  . CHEST PAIN 06/22/2009    Past Medical History:  Diagnosis Date  . Acid reflux   . Chest pain    intermittent x 1 yr  . Diabetic mellitus, gestational   . Hypertension    during pregnancy. Not treated for it now.  . IBS (irritable bowel syndrome)    dx in 2005 by primary MD  . IBS (irritable bowel syndrome)   . Obesity   . Seasonal allergies    yr roud; was immunothrapy in childhood, does not need any more at  this time    Family History  Problem Relation Age of Onset  . Kidney failure Mother   . Cancer Father 18       colon cancer  . Healthy Sister   . Healthy Brother   . Cancer Brother   . Healthy Sister   . Eczema Daughter   . Allergies Daughter   . Eczema Son   . Allergies Son    Past Surgical History:  Procedure Laterality Date  . CESAREAN SECTION W/BTL  2007  . cholectystectomy  2000  . CHOLECYSTECTOMY  2000  . COLONOSCOPY N/A 02/19/2013   Procedure: COLONOSCOPY;  Surgeon: Rogene Houston, MD;  Location: [facility];  Service: Endoscopy;  Laterality: N/A;  23   Social History   Social History Narrative   Married 14 years, 2 children ages 70 and 68.    Job: Pt accounting   Gets regular exercise.    Immunization History  Administered Date(s) Administered  . Influenza-Unspecified 09/17/2017     Objective: Vital Signs: There were no vitals taken for this visit.   Physical Exam   Musculoskeletal Exam: ***  CDAI Exam: CDAI Score: - Patient Global: -; Provider Global: - Swollen: -; Tender: - Joint Exam   No joint exam has been documented for this visit   There is currently no information documented on the homunculus. Go to the Rheumatology activity and complete the homunculus joint exam.  Investigation: No additional findings.  Imaging: No results found.  Recent Labs: Lab Results  Component Value Date   WBC 4.8 07/08/2019   HGB 13.0 07/08/2019  PLT 336 07/08/2019   NA 138 07/08/2019   K 4.7 07/08/2019   CL 101 07/08/2019   CO2 26 07/08/2019   GLUCOSE 86 07/08/2019   BUN 8 07/08/2019   CREATININE 0.71 07/08/2019   BILITOT 0.7 07/08/2019   ALKPHOS 53 10/01/2017   AST 22 07/08/2019   ALT 13 07/08/2019   PROT 7.5 07/08/2019   ALBUMIN 4.3 10/13/2018   CALCIUM 9.9 07/08/2019   GFRAA 116 07/08/2019    Speciality Comments: No specialty comments available.  Procedures:  No procedures performed Allergies: Miralax [polyethylene glycol] and Vasotec  [enalapril]   Assessment / Plan:     Visit Diagnoses: No diagnosis found.  Orders: No orders of the defined types were placed in this encounter.  No orders of the defined types were placed in this encounter.   Face-to-face time spent with patient was *** minutes. Greater than 50% of time was spent in counseling and coordination of care.  Follow-Up Instructions: No follow-ups on file.   Ofilia Neas, PA-C  Note - This record has been created using Dragon software.  Chart creation errors have been sought, but may not always  have been located. Such creation errors do not reflect on  the standard of medical care.

## 2019-10-07 ENCOUNTER — Ambulatory Visit: Payer: 59 | Admitting: Physician Assistant

## 2019-12-06 ENCOUNTER — Encounter: Payer: Self-pay | Admitting: Family Medicine

## 2019-12-08 ENCOUNTER — Ambulatory Visit: Payer: 59 | Admitting: Neurology

## 2019-12-08 ENCOUNTER — Encounter: Payer: Self-pay | Admitting: Neurology

## 2019-12-09 ENCOUNTER — Telehealth (INDEPENDENT_AMBULATORY_CARE_PROVIDER_SITE_OTHER): Payer: 59 | Admitting: Neurology

## 2019-12-09 ENCOUNTER — Other Ambulatory Visit: Payer: Self-pay

## 2019-12-09 VITALS — BP 130/82 | Ht 59.5 in | Wt 170.2 lb

## 2019-12-09 DIAGNOSIS — H538 Other visual disturbances: Secondary | ICD-10-CM | POA: Diagnosis not present

## 2019-12-09 DIAGNOSIS — R252 Cramp and spasm: Secondary | ICD-10-CM

## 2019-12-09 DIAGNOSIS — R42 Dizziness and giddiness: Secondary | ICD-10-CM

## 2019-12-09 DIAGNOSIS — G600 Hereditary motor and sensory neuropathy: Secondary | ICD-10-CM

## 2019-12-09 NOTE — Progress Notes (Signed)
Virtual Visit via Video Note The purpose of this virtual visit is to provide medical care while limiting exposure to the novel coronavirus.    Consent was obtained for video visit:  Yes.   Answered questions that patient had about telehealth interaction:  Yes.   I discussed the limitations, risks, security and privacy concerns of performing an evaluation and management service by telemedicine. I also discussed with the patient that there may be a patient responsible charge related to this service. The patient expressed understanding and agreed to proceed.  Pt location: Home Physician Location: office Name of referring provider:  Kathyrn Drown, MD I connected with Brittney [name] at patients initiation/request on 12/09/2019 at  9:50 AM EST by video enabled telemedicine application and verified that I am speaking with the correct person using two identifiers. Pt MRN:  [id number] Pt DOB:  [birth year] Video Participants:  Brittney [name]   History of Present Illness: This is a 49 y.o. female returning for follow-up of Charcot-Marie-Tooth disease type IA and new complaints of dizziness and vision changes.  She has noticed a gradual progression of neuropathy such that the numbness in the hands and feet is worse.  She also complains of difficulty with grip and holding objects.  Occasionally, she finds that her knee can buckle, but she has not suffered any falls and continues to walk unassisted.  She is very active at baseline.  She reports a few discrete spells of dizziness associated with abnormal sensation coming over her head and blurred vision.  This lasts about 5 minutes.  On at least one occasion, this was associated with a headache.  She also complains of worsening blurred vision over the past 6 months, despite having new glasses.  She has not followed back up with her eye doctor for this.  Her last office visit was in May which was reviewed.  Her neuropathy was becoming more painful and she was  offered lidocaine ointment as she did not wish to take oral medication.  She described a muscle cramps and was encouraged to start exercising and drink electrolyte water to help with this.  She is taking propel water regularly and finds that this helps her cramps.  Cramps are usually around her menstrual cycle.  She has many questions related to CMT and other concerns that she has read about on Facebook groups, which were addressed.   Observations/Objective:   Vitals:   12/08/19 1127  BP: 130/82  Weight: 170 lb 3.2 oz (77.2 kg)  Height: 4' 11.5" (1.511 m)   Patient is awake, alert, and appears comfortable.   Extraocular muscles are intact.  No ptosis.  Face is symmetric.  Speech is not dysarthric.  Language is intact.   Antigravity in all extremities, no focal atrophy is appreciated. Gait appears narrow based and unassisted.  She is able to stand on toes  . Assessment and Plan:  1.  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    -Okay to use lidocaine ointment for severe pain  -Continue supportive care and home precautions for safety including daily inspection of the hands and feet.  2.  Spells of dizziness and vision changes.  Given that at least 1 was in the setting of a headache, this is possibly a migraine variant.  Symptoms are self-limiting within 5 minutes.  If they occur longer, she can try over-the-counter NSAID with Tylenol.  MRI brain from 2019 was independently viewed and shows partially  empty sella, which can be a normal variant.  With her ongoing blurred vision, I have asked her to follow-up with her eye care specialist for dilated eye exam to evaluate the optic nerve and assess for changes in visual acuity.  3.  Muscle cramps, improved with electrolyte water.  Follow Up Instructions:   I discussed the assessment and treatment plan with the patient. The patient was provided an opportunity to ask questions and all were  answered. The patient agreed with the plan and demonstrated an understanding of the instructions.   The patient was advised to call back or seek an in-person evaluation if the symptoms worsen or if the condition fails to improve as anticipated.  Follow-up in 9 months or sooner as needed   Alda Berthold, DO

## 2020-01-16 ENCOUNTER — Other Ambulatory Visit: Payer: Self-pay | Admitting: Family Medicine

## 2020-01-19 NOTE — Telephone Encounter (Signed)
Scheduled 3/12

## 2020-01-19 NOTE — Telephone Encounter (Signed)
30 days needs follow-up visit for virtual visit

## 2020-02-11 ENCOUNTER — Other Ambulatory Visit: Payer: Self-pay

## 2020-02-11 ENCOUNTER — Ambulatory Visit (INDEPENDENT_AMBULATORY_CARE_PROVIDER_SITE_OTHER): Payer: 59 | Admitting: Family Medicine

## 2020-02-11 DIAGNOSIS — I10 Essential (primary) hypertension: Secondary | ICD-10-CM

## 2020-02-11 DIAGNOSIS — K5900 Constipation, unspecified: Secondary | ICD-10-CM

## 2020-02-11 DIAGNOSIS — E785 Hyperlipidemia, unspecified: Secondary | ICD-10-CM

## 2020-02-11 DIAGNOSIS — Z131 Encounter for screening for diabetes mellitus: Secondary | ICD-10-CM | POA: Diagnosis not present

## 2020-02-11 DIAGNOSIS — G600 Hereditary motor and sensory neuropathy: Secondary | ICD-10-CM | POA: Insufficient documentation

## 2020-02-11 MED ORDER — LOSARTAN POTASSIUM 100 MG PO TABS: 100.0000 mg | ORAL_TABLET | Freq: Every day | ORAL | 1 refills | Status: DC

## 2020-02-11 NOTE — Progress Notes (Signed)
   Subjective:    Patient ID: Brittney [name], female    DOB: 13-Jul-1971, 49 y.o.   MRN: [id number]  Hypertension This is a chronic problem. The current episode started more than 1 year ago. There are no known risk factors for coronary artery disease. Treatments tried: losartan. There are no compliance problems.   States she has been taking her medication blood pressure under good control  Patient would like to discuss severe constipation.  Relates this is been over the past few months relates that despite increasing vegetables and water in her diet she still has this she has a remote family history of colon cancer denies any blood in the stools but does state her bowel movements have been dark denies abdominal pain sweats fevers chills weight loss   She states she has not been 100% compliant with Losartan but her BP is doing well.   She also states she has tonsil stones regularly but this morning has some green fluid come from her tonsils.  She relates that at times she has a funny green drainage around the stones but no major setbacks no fevers chills or throat pain   Review of Systems Virtual Visit via Video Note  I connected with Brittney [name] on 02/11/20 at  8:30 AM EST by a video enabled telemedicine application and verified that I am speaking with the correct person using two identifiers.  Location: Patient: home Provider: office   I discussed the limitations of evaluation and management by telemedicine and the availability of in person appointments. The patient expressed understanding and agreed to proceed.  History of Present Illness:    Observations/Objective:   Assessment and Plan:   Follow Up Instructions:    I discussed the assessment and treatment plan with the patient. The patient was provided an opportunity to ask questions and all were answered. The patient agreed with the plan and demonstrated an understanding of the instructions.   The patient was advised to call  back or seek an in-person evaluation if the symptoms worsen or if the condition fails to improve as anticipated.  I provided 30 minutes of non-face-to-face time during this encounter.  This includes chart review chart documentation and time spent with patient        Objective:   Physical Exam Today's visit was via telephone Physical exam was not possible for this visit        Assessment & Plan:  1. Charcot-Marie-Tooth disease type 1 Followed by specialist causing numbness for her could be progressive over her lifetime patient dealing with it is best she can  2. Essential hypertension, benign Blood pressure good control takes her medicine watches diet stays active exercises  3. Hyperlipidemia, unspecified hyperlipidemia type We will check lab work previous labs reviewed - Lipid panel  4. Encounter for screening examination for impaired glucose regulation and diabetes mellitus Screening glucose recommended - Glucose, random  5. Constipation, unspecified constipation type Severe constipation issues with remote family history of colon cancer recommend increasing fiber in the diet but also think consideration for moving up with colonoscopy is reasonable referral to gastroenterology - Ambulatory referral to Gastroenterology

## 2020-02-16 ENCOUNTER — Encounter: Payer: Self-pay | Admitting: Family Medicine

## 2020-02-22 ENCOUNTER — Encounter (INDEPENDENT_AMBULATORY_CARE_PROVIDER_SITE_OTHER): Payer: Self-pay | Admitting: Gastroenterology

## 2020-03-14 ENCOUNTER — Telehealth: Payer: Self-pay | Admitting: Family Medicine

## 2020-03-14 NOTE — Telephone Encounter (Signed)
Patient had second Covid shot on 3/24 and now has a rash around her neck ,over her body. She states arm has a bruise ,itchy and red. She doesn't want the rash going in her hair because it might cause it to fall out . Please advise

## 2020-03-14 NOTE — Telephone Encounter (Signed)
Pt contacted and verbalized understanding.  

## 2020-03-14 NOTE — Telephone Encounter (Signed)
This is not an unusual rash. This is a immune response to the vaccine.  It can last for an additional 7 days.  Benadryl may be used every 4-6 hours to help with itching caution drowsiness.  Typically will gradually go away on its own.  It is not recommended to be on any steroids for this.  If any severe trouble we can do it in person or virtual visit to evaluate and discuss further

## 2020-03-14 NOTE — Telephone Encounter (Signed)
No fever, no cough, no shortness of breath. Pt states her arm is getting better. Rash/welps started on Sunday on neck and body. Pt has been taking Benadryl and Lotrim cream to help with itching. Please advise. Thank you  [facility]

## 2020-04-13 ENCOUNTER — Ambulatory Visit: Payer: 59 | Admitting: Family Medicine

## 2020-04-13 ENCOUNTER — Other Ambulatory Visit: Payer: Self-pay

## 2020-04-13 ENCOUNTER — Encounter: Payer: Self-pay | Admitting: Family Medicine

## 2020-04-13 VITALS — BP 132/74 | HR 69 | Temp 97.1°F | Ht 59.75 in | Wt 175.0 lb

## 2020-04-13 DIAGNOSIS — N912 Amenorrhea, unspecified: Secondary | ICD-10-CM

## 2020-04-13 DIAGNOSIS — N3000 Acute cystitis without hematuria: Secondary | ICD-10-CM

## 2020-04-13 DIAGNOSIS — B9689 Other specified bacterial agents as the cause of diseases classified elsewhere: Secondary | ICD-10-CM

## 2020-04-13 DIAGNOSIS — R82998 Other abnormal findings in urine: Secondary | ICD-10-CM | POA: Diagnosis not present

## 2020-04-13 DIAGNOSIS — N760 Acute vaginitis: Secondary | ICD-10-CM

## 2020-04-13 LAB — POCT URINALYSIS DIPSTICK
Spec Grav, UA: <=1.005 — AB (ref 1.010–1.025)
pH, UA: 8 (ref 5.0–8.0)

## 2020-04-13 LAB — POCT URINE PREGNANCY: Preg Test, Ur: NEGATIVE

## 2020-04-13 MED ORDER — METRONIDAZOLE 500 MG PO TABS: 500.0000 mg | ORAL_TABLET | Freq: Two times a day (BID) | ORAL | 0 refills | Status: DC

## 2020-04-13 NOTE — Progress Notes (Signed)
Patient ID: Brittney [name], female    DOB: 1971-10-22, 49 y.o.   MRN: [id number]   Chief Complaint  Patient presents with  . dark urine   Subjective:    HPI  CC-cloudy urine  pt having dark urine for about one month. Cloudy urine started today. She states the last time she went to gyn she was told she had blood in her urine in 2019. Also concerned that her last cycle started march 30th and did not have one in April.  No fever or abd pain.  Noticing some small amt vaginal discharge.  Supposed to be on period, but hasn't started yet. Not having pain with urination,urgency, or frequency.  No hematuria. No concern of exposure to stds. She states she's married in a relationship.    Results for orders placed or performed in visit on 04/13/20  POCT urinalysis dipstick  Result Value Ref Range   Color, UA     Clarity, UA     Glucose, UA     Bilirubin, UA     Ketones, UA     Spec Grav, UA <=1.005 (A) 1.010 - 1.025   Blood, UA     pH, UA 8.0 5.0 - 8.0   Protein, UA     Urobilinogen, UA     Nitrite, UA     Leukocytes, UA     Appearance     Odor    POCT urine pregnancy  Result Value Ref Range   Preg Test, Ur Negative Negative        Medical History Brittney [name] has a past medical history of Acid reflux, Chest pain, Diabetic mellitus, gestational, Hypertension, IBS (irritable bowel syndrome), IBS (irritable bowel syndrome), Obesity, and Seasonal allergies.   Outpatient Encounter Medications as of 04/13/2020  Medication Sig  . Ascorbic Acid (VITAMIN C PO) Take by mouth daily.  Marland Kitchen BIOTIN PO Take by mouth daily.  . cholecalciferol (VITAMIN D) 1000 units tablet Take 2,000 Units by mouth daily.  . Collagen 500 MG CAPS Take by mouth.  . loratadine (CLARITIN) 10 MG tablet Take 10 mg by mouth daily.  Marland Kitchen losartan (COZAAR) 100 MG tablet Take 1 tablet (100 mg total) by mouth daily.  Marland Kitchen MAGNESIUM PO Take 400 mg by mouth.  . Multiple Vitamins-Minerals (MULTIVITAMIN PO) Take by mouth daily.  Marland Kitchen  omega-3 acid ethyl esters (LOVAZA) 1 g capsule Take 1 g by mouth daily.  Marland Kitchen omeprazole (PRILOSEC) 20 MG capsule Take 20 mg by mouth as needed.   . TURMERIC PO Take 375 mg by mouth.   Marland Kitchen UNABLE TO FIND Med Name: Bone Broth  . metroNIDAZOLE (FLAGYL) 500 MG tablet Take 1 tablet (500 mg total) by mouth 2 (two) times daily.   No facility-administered encounter medications on file as of 04/13/2020.     Review of Systems  Gastrointestinal: Negative for abdominal pain, diarrhea, nausea and vomiting.  Genitourinary: Positive for vaginal discharge. Negative for decreased urine volume, difficulty urinating, dyspareunia, dysuria, flank pain, frequency, genital sores, hematuria, urgency, vaginal bleeding and vaginal pain.  Musculoskeletal: Negative for arthralgias and back pain.  Skin: Negative for rash.     Vitals BP 132/74   Pulse 69   Temp (!) 97.1 F (36.2 C)   Ht 4' 11.75" (1.518 m)   Wt 175 lb (79.4 kg)   LMP 02/29/2020   SpO2 100%   BMI 34.46 kg/m   Objective:   Physical Exam Constitutional:      General: She is not in acute  distress.    Appearance: Normal appearance. She is not ill-appearing.  Pulmonary:     Effort: Pulmonary effort is normal. No respiratory distress.     Breath sounds: Normal breath sounds.  Abdominal:     Palpations: Abdomen is soft.     Tenderness: There is no abdominal tenderness.  Musculoskeletal:        General: Normal range of motion.  Skin:    General: Skin is warm and dry.  Neurological:     General: No focal deficit present.     Mental Status: She is alert and oriented to person, place, and time.  Psychiatric:        Mood and Affect: Mood normal.        Behavior: Behavior normal.      Assessment and Plan   1. Bacterial vaginitis - metroNIDAZOLE (FLAGYL) 500 MG tablet; Take 1 tablet (500 mg total) by mouth 2 (two) times daily.  Dispense: 14 tablet; Refill: 0  2. Dark urine - POCT urinalysis dipstick - Urine Culture  3. Amenorrhea -  POCT urine pregnancy    -micro- clue cells present. No rbc, or wbc. UA-neg for blood, nitrites, or LEs. Neg-UPT  +BV on microscope- gave 7 days flagyl, advising avoiding alcohol while taking this medication.  Call or rto if not improving.  Pt in agreement.  F/u prn.

## 2020-04-17 LAB — URINE CULTURE

## 2020-04-17 MED ORDER — NITROFURANTOIN MONOHYD MACRO 100 MG PO CAPS: 100.0000 mg | ORAL_CAPSULE | Freq: Two times a day (BID) | ORAL | 0 refills | Status: DC

## 2020-04-17 NOTE — Addendum Note (Signed)
Addended by: Erven Colla on: 04/17/2020 08:18 AM   Modules accepted: Orders

## 2020-04-17 NOTE — Progress Notes (Addendum)
Addendum-  Urine culture- grew out Citrobacter.  Will call in macrobid bid for 7 days. Sent to [facility]. She can start this today.  Call or rto if not improving.   Thx,   Dr. Lovena Le

## 2020-04-21 ENCOUNTER — Telehealth: Payer: Self-pay | Admitting: Family Medicine

## 2020-04-21 NOTE — Telephone Encounter (Signed)
She is going to stop taking the medication for UTI metroNIDAZOLE (FLAGYL) 500 MG tablet nitrofurantoin, macrocrystal-monohydrate, (MACROBID) 100 MG  Patient was diagnosed with CMT Diease and these medications are consider neuro toxin on Medication  the list.

## 2020-04-21 NOTE — Telephone Encounter (Signed)
Please advise. Thank you

## 2020-04-24 ENCOUNTER — Encounter: Payer: Self-pay | Admitting: Family Medicine

## 2020-04-24 NOTE — Telephone Encounter (Signed)
Please talk with patient to find out a little bit more about the situation thank you

## 2020-04-24 NOTE — Telephone Encounter (Signed)
Urine is still dark. She did take 3 full days on antibiotics but not having any burning, frequency, no blood in urine, no fever. If you want her to have another antibiotic [facility] [place] and she would like a call back either way.

## 2020-04-24 NOTE — Telephone Encounter (Signed)
Discussed with pt. Pt verbalized understanding.  °

## 2020-04-24 NOTE — Telephone Encounter (Signed)
With the small amt of bacteria in the urine and 3 days of the antibiotic we can just continue to monitor.  If the pain, burning, or odor returns then we can try another medication.   Thx,   Dr. Lovena Le

## 2020-04-24 NOTE — Telephone Encounter (Signed)
Thank you for letting me know. And apologies for pt having this discomfort with these medications. If pt is still having urinary discomfort let us know. We could give a different antibiotic.   Thank you,   Dr. Lovena Le

## 2020-04-24 NOTE — Telephone Encounter (Signed)
Pt saw dr Lovena Le on 5/13 and prescribed macrobid and flagyl. Took three days worth of both meds. States on 2nd day of taking meds. Her eye lids started jumping and muscles in legs were jumping and weakness in left thigh. Recently diagnosed with CMT and states both meds are on list of meds not to take. Please advise.

## 2020-06-01 ENCOUNTER — Ambulatory Visit (INDEPENDENT_AMBULATORY_CARE_PROVIDER_SITE_OTHER): Payer: 59 | Admitting: Gastroenterology

## 2020-06-22 ENCOUNTER — Encounter (INDEPENDENT_AMBULATORY_CARE_PROVIDER_SITE_OTHER): Payer: Self-pay | Admitting: Gastroenterology

## 2020-06-22 ENCOUNTER — Other Ambulatory Visit: Payer: Self-pay

## 2020-06-22 ENCOUNTER — Ambulatory Visit (INDEPENDENT_AMBULATORY_CARE_PROVIDER_SITE_OTHER): Payer: 59 | Admitting: Gastroenterology

## 2020-06-22 VITALS — BP 126/77 | HR 77 | Temp 99.1°F | Ht 59.5 in | Wt 176.2 lb

## 2020-06-22 DIAGNOSIS — K582 Mixed irritable bowel syndrome: Secondary | ICD-10-CM | POA: Diagnosis not present

## 2020-06-22 DIAGNOSIS — K219 Gastro-esophageal reflux disease without esophagitis: Secondary | ICD-10-CM

## 2020-06-22 MED ORDER — LINACLOTIDE 72 MCG PO CAPS: 72.0000 ug | ORAL_CAPSULE | Freq: Every day | ORAL | 5 refills | Status: DC

## 2020-06-22 NOTE — Progress Notes (Signed)
Maylon Peppers, M.D. Gastroenterology & Hepatology [facility] [address] [place], [place] [address] Primary Care Physician: Kathyrn Drown, MD [place] [id number]  Referring MD: PCP  I will communicate my assessment and recommendations to the referring MD via EMR. Note: Occasional unusual wording and randomly placed punctuation marks may result from the use of speech recognition technology to transcribe this document"  Chief Complaint: Constipation  History of Present Illness: Ayshia Gramlich is a 49 y.o. female with HTN, IBNS-M, GERD, Sjogrens and Charcot Marie-Tooth disease, who presents for evaluation of constipation  Patient reports that in 2000 she had a cholecystectomy and had diarrhea for 2 years after the surgery which she was able to manage with dietary changes.  Since then, she had worsening constipation.  She is states that she has to strain significantly to have a bowel movement and has noticed that the constipation has been getting worse recently.  She is states that she needs to take something to move her bowels at some point of the week to avoid having an absence of bowel movements.  Patient reports that for the last 3-4 years she has been taking Smooth Move tea (has Senna) - She takes it twice a week which makes her have a BM next day. She has 1-3 BMs when she takes the laxative, usually the 1st bowel movement is very large in amount. She also report that she has had intermittent bloating for the last year, usually after eating or taking the laxative. The patient denies having any nausea, vomiting, fever, chills, hematochezia, melena, hematemesis, abdominal distention, abdominal pain, or weight loss.  I personally reviewed and interpreted the available lab results which showed: TSH was performed in 10/28/2018, was 1.35 normal  Last CVE:LFYBO Last Colonoscopy:2014 - Normal mucosa of terminal ileum.  Scattered erosions at distal sigmoid colon and rectosigmoid junction. Biopsy taken, positive for erosions but no other findings. Small hemorrhoids below the dentate line along with single anal papilla.  FHx: neg for any gastrointestinal/liver disease, father throat cancer, father colon cancer (dx'd at age 30) Social: neg smoking or illicit drug use. Drinks 1-2 glasses of wine a week. Surgical: C-section, cholecystectomy  Past Medical History: Past Medical History:  Diagnosis Date  . Acid reflux   . Chest pain    intermittent x 1 yr  . Diabetic mellitus, gestational   . Hypertension    during pregnancy. Not treated for it now.  . IBS (irritable bowel syndrome)    dx in 2005 by primary MD  . IBS (irritable bowel syndrome)   . Obesity   . Seasonal allergies    yr roud; was immunothrapy in childhood, does not need any more at this time    Past Surgical History: Past Surgical History:  Procedure Laterality Date  . CESAREAN SECTION W/BTL  2007  . cholectystectomy  2000  . CHOLECYSTECTOMY  2000  . COLONOSCOPY N/A 02/19/2013   Procedure: COLONOSCOPY;  Surgeon: Rogene Houston, MD;  Location: [facility];  Service: Endoscopy;  Laterality: N/A;  16    Family History: Family History  Problem Relation Age of Onset  . Kidney failure Mother   . Cancer Father 61       colon cancer  . Healthy Sister   . Healthy Brother   . Cancer Brother   . Healthy Sister   . Eczema Daughter   . Allergies Daughter   . Eczema Son   .  Allergies Son     Social History: Social History   Tobacco Use  Smoking Status Never Smoker  Smokeless Tobacco Never Used   Social History   Substance and Sexual Activity  Alcohol Use Yes   Comment: occassionally    Social History   Substance and Sexual Activity  Drug Use Never    Allergies: Allergies  Allergen Reactions  . Miralax [Polyethylene Glycol] Swelling    Tongue swelling  . Vasotec [Enalapril]     cough    Medications: Current  Outpatient Medications  Medication Sig Dispense Refill  . Ascorbic Acid (VITAMIN C PO) Take by mouth daily.    Marland Kitchen BIOTIN PO Take by mouth daily.    . cholecalciferol (VITAMIN D) 1000 units tablet Take 2,000 Units by mouth daily.    . Collagen 500 MG CAPS Take by mouth daily. This is in powder form    . loratadine (CLARITIN) 10 MG tablet Take 10 mg by mouth daily.    Marland Kitchen losartan (COZAAR) 100 MG tablet Take 1 tablet (100 mg total) by mouth daily. 90 tablet 1  . MAGNESIUM PO Take 400 mg by mouth daily.     . Multiple Vitamins-Minerals (MULTIVITAMIN PO) Take by mouth daily.    Marland Kitchen omega-3 acid ethyl esters (LOVAZA) 1 g capsule Take 1 g by mouth daily.    Marland Kitchen omeprazole (PRILOSEC) 20 MG capsule Take 20 mg by mouth as needed.     . TURMERIC PO Take 375 mg by mouth daily.     Marland Kitchen UNABLE TO FIND Med Name: Bone Broth     No current facility-administered medications for this visit.    Review of Systems: GENERAL: negative for malaise, night sweats HEENT: No changes in hearing or vision, no nose bleeds or other nasal problems. NECK: Negative for lumps, goiter, pain and significant neck swelling RESPIRATORY: Negative for cough, wheezing CARDIOVASCULAR: Negative for chest pain, leg swelling, palpitations, orthopnea GI: SEE HPI MUSCULOSKELETAL: Negative for joint pain or swelling, back pain, and muscle pain. SKIN: Negative for lesions, rash PSYCH: Negative for sleep disturbance, mood disorder and recent psychosocial stressors. HEMATOLOGY Negative for prolonged bleeding, bruising easily, and swollen nodes. ENDOCRINE: Negative for cold or heat intolerance, polyuria, polydipsia and goiter. NEURO: negative for tremor, gait imbalance, syncope and seizures. The remainder of the review of systems is noncontributory.   Physical Exam: BP 126/77 (BP Location: Right Arm, Patient Position: Sitting, Cuff Size: Normal)   Pulse 77   Temp 99.1 F (37.3 C) (Oral)   Ht 4' 11.5" (1.511 m)   Wt 176 lb 3.2 oz (79.9 kg)    BMI 34.99 kg/m  GENERAL: The patient is AO x3, in no acute distress. Obese. HEENT: Head is normocephalic and atraumatic. EOMI are intact. Mouth is well hydrated and without lesions. NECK: Supple. No masses LUNGS: Clear to auscultation. No presence of rhonchi/wheezing/rales. Adequate chest expansion HEART: RRR, normal s1 and s2. ABDOMEN: Soft, nontender, no guarding, no peritoneal signs, and nondistended. BS +. No masses. EXTREMITIES: Without any cyanosis, clubbing, rash, lesions or edema. NEUROLOGIC: AOx3, no focal motor deficit. SKIN: no jaundice, no rashes   Imaging/Labs: as above  I personally reviewed and interpreted the available labs, imaging and endoscopic files.  Impression and Plan: Surena Welge is a 49 y.o. female with HTN, IBNS-M, GERD, Sjogrens and Charcot Marie-Tooth disease, who presents for evaluation of constipation.  The patient has had a chronic history of constipation which she has been able to manage partially with the use of  over-the-counter laxative teas.  However, more recently she has presented recurrent bloating episodes despite having an adequate bowel movement when taking the tea.  She does not have any red flag signs otherwise.  She had a negative colonoscopy in the past for any obstructive causes of constipation.  I considered at these symptoms are consistent with irritable bowel syndrome mixed type but predominantly constipation type that would benefit from a medication that improves her bowel movement frequency but also decreases the intussusception.  Due to this, I would recommend starting Linzess 72 mcg every day.  She will also benefit from implementing the intake of fruits such as prunes or kiwi.  She has a history of an allergy to MiraLAX so this option is not adequate.  Finally, we'll check a repeat TSH to rule out any other reversible causes of constipation.  -Take prune juice and eat kiwi daily -Start Linzess  72 mcg qday -Check TSH -RTC 3  months  All questions were answered.      Harvel Quale, MD Gastroenterology and Hepatology [facility]

## 2020-06-22 NOTE — Patient Instructions (Signed)
Take prune juice and eat kiwi daily Start Linzess  72 mcg qday Perform blood workup RTC 3 months

## 2020-07-04 ENCOUNTER — Telehealth (INDEPENDENT_AMBULATORY_CARE_PROVIDER_SITE_OTHER): Payer: Self-pay | Admitting: Gastroenterology

## 2020-07-04 LAB — TSH: TSH: 1.14 mIU/L

## 2020-07-04 NOTE — Telephone Encounter (Signed)
I called the patient to inform her about the results of her recent blood testing which showed normal TSH.  She reports that she has been taking her Linzess compliantly for the last week and she was having a good response but she skipped 1 day and restarted the last 2 days and hasn't moved her bowels.  I reinforced her importance of taking the medication every day for at least the next 2 weeks.  If there is no improvement then we'll have to increase her dose to 145 mcg/day.  The patient understood and agreed.  Maylon Peppers, MD Gastroenterology and Hepatology [facility]

## 2020-07-05 ENCOUNTER — Other Ambulatory Visit: Payer: Self-pay

## 2020-07-05 ENCOUNTER — Ambulatory Visit: Payer: 59 | Admitting: Family Medicine

## 2020-07-05 ENCOUNTER — Encounter: Payer: Self-pay | Admitting: Family Medicine

## 2020-07-05 VITALS — BP 148/86 | HR 76 | Temp 97.5°F | Ht 59.5 in | Wt 178.0 lb

## 2020-07-05 DIAGNOSIS — R198 Other specified symptoms and signs involving the digestive system and abdomen: Secondary | ICD-10-CM | POA: Diagnosis not present

## 2020-07-05 DIAGNOSIS — R300 Dysuria: Secondary | ICD-10-CM | POA: Diagnosis not present

## 2020-07-05 LAB — POCT URINALYSIS DIPSTICK
Spec Grav, UA: 1.01 (ref 1.010–1.025)
pH, UA: 8 (ref 5.0–8.0)

## 2020-07-05 NOTE — Progress Notes (Signed)
Patient ID: Brittney [name], female    DOB: 12-18-1970, 49 y.o.   MRN: [id number]   Chief Complaint  Patient presents with  . Urinary Tract Infection   Subjective:    HPI  having dark urine, odor to urine, not able to tell when she has to urinate. Lower abdominal pressure. Urine stream is slow.   Having decreased sensation of needing to urinate. When she does go urinate.   Swelling in hands for one month. Reports weight gain 168 pounds in June to 178 pounds today.  Results for orders placed or performed in visit on 07/05/20  POCT urinalysis dipstick  Result Value Ref Range   Color, UA     Clarity, UA     Glucose, UA     Bilirubin, UA     Ketones, UA     Spec Grav, UA 1.010 1.010 - 1.025   Blood, UA     pH, UA 8.0 5.0 - 8.0   Protein, UA     Urobilinogen, UA     Nitrite, UA     Leukocytes, UA     Appearance     Odor       Medical History Rucha has a past medical history of Acid reflux, Chest pain, Diabetic mellitus, gestational, Hypertension, IBS (irritable bowel syndrome), IBS (irritable bowel syndrome), Obesity, and Seasonal allergies.   Outpatient Encounter Medications as of 07/05/2020  Medication Sig  . Ascorbic Acid (VITAMIN C PO) Take by mouth daily.  Marland Kitchen BIOTIN PO Take by mouth daily.  . cholecalciferol (VITAMIN D) 1000 units tablet Take 2,000 Units by mouth daily.  . Collagen 500 MG CAPS Take by mouth daily. This is in powder form  . linaclotide (LINZESS) 72 MCG capsule Take 1 capsule (72 mcg total) by mouth daily.  Marland Kitchen loratadine (CLARITIN) 10 MG tablet Take 10 mg by mouth daily.  Marland Kitchen losartan (COZAAR) 100 MG tablet Take 1 tablet (100 mg total) by mouth daily.  Marland Kitchen MAGNESIUM PO Take 400 mg by mouth daily.   . Multiple Vitamins-Minerals (MULTIVITAMIN PO) Take by mouth daily.  Marland Kitchen omega-3 acid ethyl esters (LOVAZA) 1 g capsule Take 1 g by mouth daily.  Marland Kitchen omeprazole (PRILOSEC) 20 MG capsule Take 20 mg by mouth as needed.   . TURMERIC PO Take 375 mg by mouth daily.   Marland Kitchen  UNABLE TO FIND Med Name: Bone Broth   No facility-administered encounter medications on file as of 07/05/2020.     Review of Systems  Constitutional: Negative for chills, fatigue and fever.  HENT: Negative.   Eyes: Negative.   Respiratory: Negative.   Cardiovascular: Positive for leg swelling. Negative for chest pain and palpitations.       And upper extremity swelling.   Gastrointestinal: Positive for abdominal distention, abdominal pain and constipation.       Saw Dr. Tobey Bride, GI for constipation.   Genitourinary: Positive for difficulty urinating. Negative for frequency, hematuria, pelvic pain and urgency.       Describes urinary symptoms as "decreased sensation" for knowing she needs to urinate.  Musculoskeletal: Negative.   Skin: Negative.   Neurological: Positive for headaches.       Had one headache and it went away. Nothing seemed concerning to her at the time.  Hematological: Negative.   Psychiatric/Behavioral: Negative.      Vitals BP (!) 148/86   Pulse 76   Temp (!) 97.5 F (36.4 C)   Ht 4' 11.5" (1.511 m)   Wt 178 lb (  80.7 kg)   SpO2 100%   BMI 35.35 kg/m   Objective:   Physical Exam Constitutional:      Appearance: Normal appearance.  Cardiovascular:     Rate and Rhythm: Normal rate and regular rhythm.     Heart sounds: Normal heart sounds.  Pulmonary:     Effort: Pulmonary effort is normal.     Breath sounds: Normal breath sounds.  Abdominal:     General: Bowel sounds are normal.     Palpations: There is no mass.     Tenderness: There is no abdominal tenderness. There is no guarding or rebound.     Comments: Describes abdomin as "fullness"  States it felt strange upon LUQ palpation and suprapubic palpation. Denies pain. No Ascites noted.   Neurological:     Mental Status: She is alert.      Assessment and Plan   1. abd pain - POCT urinalysis dipstick - Comprehensive metabolic panel - CBC with Differential/Platelet - Lipase - C-reactive  protein - Sedimentation rate  2. Generalized abdominal fullness - Comprehensive metabolic panel - CBC with Differential/Platelet - Lipase - C-reactive protein - Sedimentation rate  Abdominal fullness without concerning abdominal findings today. Her biggest concern today is her inability to feel when she needs to urinate. Saw GI for constipation and was started on medication for this.   Urine dip in office negative. Normal appearance of urine- clear, yellow and without sediment.   Patient has not had labs done since 2019. Above labs ordered and she will return today before 4:30 after her work day to get these drawn.  Patient understands that her symptoms are somewhat vague and we will need the lab results before next steps can be determined. She agrees with plan of care and understands that more information is needed.  Follow-up next week with Dr. Sallee Lange. Patient reports she has jury duty on Monday (if she has to serve) and will have to play it by ear on f/u next week.  She understands the importance of this follow-up.  Questions answered to patient's satisfaction.  Pecolia Ades, NP    07/05/2020

## 2020-07-05 NOTE — Addendum Note (Signed)
Addended by: Carmelina Noun on: 07/05/2020 01:29 PM   Modules accepted: Orders

## 2020-07-05 NOTE — Patient Instructions (Signed)
Getting lab work and follow-up with Dr. Nicki Reaper next week.

## 2020-07-06 LAB — CBC WITH DIFFERENTIAL/PLATELET
Basophils Absolute: 0.1 10*3/uL (ref 0.0–0.2)
Basos: 1 %
EOS (ABSOLUTE): 0.1 10*3/uL (ref 0.0–0.4)
Eos: 1 %
Hematocrit: 38.3 % (ref 34.0–46.6)
Hemoglobin: 12.9 g/dL (ref 11.1–15.9)
Immature Grans (Abs): 0 10*3/uL (ref 0.0–0.1)
Immature Granulocytes: 0 %
Lymphocytes Absolute: 2.6 10*3/uL (ref 0.7–3.1)
Lymphs: 35 %
MCH: 31.2 pg (ref 26.6–33.0)
MCHC: 33.7 g/dL (ref 31.5–35.7)
MCV: 93 fL (ref 79–97)
Monocytes Absolute: 0.6 10*3/uL (ref 0.1–0.9)
Monocytes: 8 %
Neutrophils Absolute: 4 10*3/uL (ref 1.4–7.0)
Neutrophils: 55 %
Platelets: 302 10*3/uL (ref 150–450)
RBC: 4.13 x10E6/uL (ref 3.77–5.28)
RDW: 12.9 % (ref 11.7–15.4)
WBC: 7.3 10*3/uL (ref 3.4–10.8)

## 2020-07-06 LAB — LIPASE: Lipase: 54 U/L (ref 14–72)

## 2020-07-06 LAB — COMPREHENSIVE METABOLIC PANEL
ALT: 13 IU/L (ref 0–32)
AST: 18 IU/L (ref 0–40)
Albumin/Globulin Ratio: 1.7 (ref 1.2–2.2)
Albumin: 4.5 g/dL (ref 3.8–4.8)
Alkaline Phosphatase: 47 IU/L — ABNORMAL LOW (ref 48–121)
BUN/Creatinine Ratio: 11 (ref 9–23)
BUN: 7 mg/dL (ref 6–24)
Bilirubin Total: 0.2 mg/dL (ref 0.0–1.2)
CO2: 25 mmol/L (ref 20–29)
Calcium: 9.8 mg/dL (ref 8.7–10.2)
Chloride: 99 mmol/L (ref 96–106)
Creatinine, Ser: 0.65 mg/dL (ref 0.57–1.00)
GFR calc Af Amer: 120 mL/min/{1.73_m2} (ref >59)
GFR calc non Af Amer: 104 mL/min/{1.73_m2} (ref >59)
Globulin, Total: 2.7 g/dL (ref 1.5–4.5)
Glucose: 103 mg/dL — ABNORMAL HIGH (ref 65–99)
Potassium: 4.5 mmol/L (ref 3.5–5.2)
Sodium: 138 mmol/L (ref 134–144)
Total Protein: 7.2 g/dL (ref 6.0–8.5)

## 2020-07-06 LAB — SEDIMENTATION RATE: Sed Rate: 19 mm/hr (ref 0–40)

## 2020-07-06 LAB — C-REACTIVE PROTEIN: CRP: 7 mg/L (ref 0–10)

## 2020-07-07 ENCOUNTER — Other Ambulatory Visit: Payer: Self-pay | Admitting: Family Medicine

## 2020-07-07 MED ORDER — CEPHALEXIN 500 MG PO CAPS
ORAL_CAPSULE | ORAL | 0 refills | Status: DC
Start: 2020-07-07 — End: 2020-09-28

## 2020-07-11 LAB — URINE CULTURE

## 2020-07-13 ENCOUNTER — Ambulatory Visit: Payer: 59 | Admitting: Family Medicine

## 2020-07-17 ENCOUNTER — Other Ambulatory Visit: Payer: Self-pay

## 2020-07-17 ENCOUNTER — Ambulatory Visit: Payer: 59 | Admitting: Family Medicine

## 2020-07-17 ENCOUNTER — Encounter: Payer: Self-pay | Admitting: Family Medicine

## 2020-07-17 VITALS — BP 134/76 | Temp 97.7°F | Wt 179.8 lb

## 2020-07-17 DIAGNOSIS — I10 Essential (primary) hypertension: Secondary | ICD-10-CM | POA: Diagnosis not present

## 2020-07-17 MED ORDER — LOSARTAN POTASSIUM 50 MG PO TABS: 50.0000 mg | ORAL_TABLET | Freq: Every day | ORAL | 1 refills | Status: DC

## 2020-07-17 NOTE — Progress Notes (Addendum)
   Subjective:    Patient ID: Brittney [name], female    DOB: 06-Feb-1971, 49 y.o.   MRN: [id number]  HPI Pt here for follow up. Pt is still having some abdominal pain/fullness. Is able to feel sensation to urinate more than at visit but it is still not what it used to be.  Cycle started on Friday (has not had one in 2 months). Regular flow, some blood clots  Patient relates that after she had her second Covid shot she started feeling really bad aching in her legs intermittent numbness  She feels convinced that this was due to the vaccine.  States she was not having it before the vaccine.  As for her abdominal symptoms he seems to be doing better urinary symptoms have been doing better since finishing the antibiotics.  In addition to this she does relate some vague lower abdominal discomfort and she is due to see her gynecologist Dr. Garwin Brothers in the near future Review of Systems Please see above denies any chest tightness pressure pain shortness of breath    Objective:   Physical Exam Lungs clear respiratory rate normal heart regular abdomen soft no guarding rebound tenderness extremities no edema       Assessment & Plan:  Unusual neurologic symptoms that she attributes to the vaccine-I certainly do not discount her experience her symptoms but I do not have scientific evidence to tie this to the vaccine.  She is hesitant to do any booster.  CMT-follow-up with neurology later this fall  Recent UTI recommend patient come back for a nurse visit within 2 to 3 weeks check urine and culture  Patient relates feeling woozy and lightheaded with standing could be her blood pressures medicine is too strong  She had not been taking losartan the past several days yet her blood pressure is very good reduce losartan to 50 mg daily check blood pressure as a nurse visit in 2 to 3 weeks  I did advise the patient to follow-up with Dr. Garwin Brothers to do a pelvic ultrasound to look at the endometrial thickness as  well as ovaries because of irregular cycles

## 2020-07-27 ENCOUNTER — Other Ambulatory Visit (INDEPENDENT_AMBULATORY_CARE_PROVIDER_SITE_OTHER): Payer: Self-pay | Admitting: Gastroenterology

## 2020-07-27 ENCOUNTER — Encounter (INDEPENDENT_AMBULATORY_CARE_PROVIDER_SITE_OTHER): Payer: Self-pay

## 2020-07-27 DIAGNOSIS — K581 Irritable bowel syndrome with constipation: Secondary | ICD-10-CM

## 2020-07-27 MED ORDER — LINACLOTIDE 145 MCG PO CAPS: 145.0000 ug | ORAL_CAPSULE | Freq: Every day | ORAL | 2 refills | Status: DC

## 2020-07-27 NOTE — Progress Notes (Signed)
Will refill Linzess 145 mcg daily

## 2020-07-28 ENCOUNTER — Other Ambulatory Visit (INDEPENDENT_AMBULATORY_CARE_PROVIDER_SITE_OTHER): Payer: Self-pay | Admitting: Gastroenterology

## 2020-07-28 DIAGNOSIS — K581 Irritable bowel syndrome with constipation: Secondary | ICD-10-CM

## 2020-07-28 MED ORDER — LINACLOTIDE 145 MCG PO CAPS: 145.0000 ug | ORAL_CAPSULE | Freq: Every day | ORAL | 1 refills | Status: DC

## 2020-07-28 NOTE — Progress Notes (Signed)
90 day refill.

## 2020-08-02 ENCOUNTER — Other Ambulatory Visit: Payer: 59

## 2020-08-09 ENCOUNTER — Other Ambulatory Visit: Payer: 59

## 2020-08-16 ENCOUNTER — Encounter (INDEPENDENT_AMBULATORY_CARE_PROVIDER_SITE_OTHER): Payer: Self-pay

## 2020-09-08 ENCOUNTER — Other Ambulatory Visit: Payer: Self-pay

## 2020-09-08 ENCOUNTER — Encounter: Payer: Self-pay | Admitting: Neurology

## 2020-09-08 ENCOUNTER — Ambulatory Visit: Payer: 59 | Admitting: Neurology

## 2020-09-08 VITALS — BP 130/77 | HR 67 | Ht 59.0 in | Wt 178.0 lb

## 2020-09-08 DIAGNOSIS — R208 Other disturbances of skin sensation: Secondary | ICD-10-CM | POA: Diagnosis not present

## 2020-09-08 DIAGNOSIS — G600 Hereditary motor and sensory neuropathy: Secondary | ICD-10-CM

## 2020-09-08 DIAGNOSIS — R252 Cramp and spasm: Secondary | ICD-10-CM

## 2020-09-08 NOTE — Progress Notes (Signed)
Follow-up Visit   Date: 09/08/20   Brittney [name] MRN: [id number] DOB: 1971/01/18   Interim History: Brittney [name] is a 49 y.o. *female  returning to the clinic for follow-up of CMT and new complaints of muscle spasms.  The patient was accompanied to the clinic by self.  She has noticed mild progression of weakness and numbness/tingling in the hands.  It is harder for her to lift weights and open jars.  She has not suffered any falls.    Muscle spasms mostly in the low back and legs.  It occurs nightly.  She does not wish to take any medications  She also complains of hot flashes and nightly sensation of whole body chill.  It lasts a few seconds, but occurs every night.  She has generalized fatigue.    Medications:  Current Outpatient Medications on File Prior to Visit  Medication Sig Dispense Refill  . Ascorbic Acid (VITAMIN C PO) Take by mouth daily.    Marland Kitchen BIOTIN PO Take by mouth daily.    . cephALEXin (KEFLEX) 500 MG capsule Take one capsule po QID for 7 days 28 capsule 0  . cholecalciferol (VITAMIN D) 1000 units tablet Take 2,000 Units by mouth daily.    . Collagen 500 MG CAPS Take by mouth daily. This is in powder form    . linaclotide (LINZESS) 145 MCG CAPS capsule Take 1 capsule (145 mcg total) by mouth daily. 90 capsule 1  . loratadine (CLARITIN) 10 MG tablet Take 10 mg by mouth daily.    Marland Kitchen losartan (COZAAR) 50 MG tablet Take 1 tablet (50 mg total) by mouth daily. 90 tablet 1  . Maca Root POWD by Does not apply route.    Marland Kitchen MAGNESIUM PO Take 400 mg by mouth daily.     . Multiple Vitamins-Minerals (MULTIVITAMIN PO) Take by mouth daily.    Marland Kitchen omega-3 acid ethyl esters (LOVAZA) 1 g capsule Take 1 g by mouth daily.    Marland Kitchen omeprazole (PRILOSEC) 20 MG capsule Take 20 mg by mouth as needed.     . TURMERIC PO Take 375 mg by mouth daily.     Marland Kitchen UNABLE TO FIND Med Name: Bone Broth     No current facility-administered medications on file prior to visit.    Allergies:  Allergies    Allergen Reactions  . Miralax [Polyethylene Glycol] Swelling    Tongue swelling  . Vasotec [Enalapril]     cough    Vital Signs:  BP 130/77   Pulse 67   Ht 4\' 11"  (1.499 m)   Wt 178 lb (80.7 kg)   SpO2 100%   BMI 35.95 kg/m   Neurological Exam: MENTAL STATUS including orientation to time, place, person, recent and remote memory, attention span and concentration, language, and fund of knowledge is normal.  Speech is not dysarthric.  CRANIAL NERVES:   Normal conjugate, extra-ocular eye movements in all directions of gaze.  No ptosis .    MOTOR:  Motor strength is 5/5 in all extremities, except mild bilateral hand weakness 4/5 and toe extension and flexion weakness 4/5.  Mild bilateral hand atrophy, no fasciculations or abnormal movements.  No pronator drift.  Tone is normal.    MSRs:  Reflexes are 2+/4 throughout, except 1+ bilateral triceps.  SENSORY:  Vibration is intact throughout.  COORDINATION/GAIT: .  Gait narrow based and stable.   Data: Lab Results  Component Value Date   TSH 1.14 07/03/2020   MRI brain wo contrast  07/31/2018: Normal noncontrast MRI appearance of the brain aside from partially empty sella, which can be a normal anatomic variant but is also associated with idiopathic intracranial hypertension (pseudotumor cerebri). CSF opening pressure measurement would evaluate further.  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  Prior MRI brain from 2019 did not show anything which would suggest central process.   TSH is normal. She takes B12 supplements.  Return to clinic in 9 months  Total time spent reviewing records, interview, history/exam, documentation, and coordination of  care on day of encounter:  30 min   Thank you for allowing me to participate in patient's care.  If I can answer any additional questions, I would be pleased to do so.    Sincerely,    Idolina Mantell K. Posey Pronto, DO

## 2020-09-08 NOTE — Patient Instructions (Signed)
Return to clinic 9 months

## 2020-09-25 ENCOUNTER — Ambulatory Visit (INDEPENDENT_AMBULATORY_CARE_PROVIDER_SITE_OTHER): Payer: 59 | Admitting: Gastroenterology

## 2020-09-28 ENCOUNTER — Telehealth (INDEPENDENT_AMBULATORY_CARE_PROVIDER_SITE_OTHER): Payer: Self-pay | Admitting: *Deleted

## 2020-09-28 ENCOUNTER — Other Ambulatory Visit: Payer: Self-pay

## 2020-09-28 ENCOUNTER — Ambulatory Visit (INDEPENDENT_AMBULATORY_CARE_PROVIDER_SITE_OTHER): Payer: 59 | Admitting: Gastroenterology

## 2020-09-28 ENCOUNTER — Encounter (INDEPENDENT_AMBULATORY_CARE_PROVIDER_SITE_OTHER): Payer: Self-pay | Admitting: Gastroenterology

## 2020-09-28 ENCOUNTER — Encounter (INDEPENDENT_AMBULATORY_CARE_PROVIDER_SITE_OTHER): Payer: Self-pay | Admitting: *Deleted

## 2020-09-28 VITALS — BP 138/84 | HR 71 | Temp 99.4°F | Ht 59.0 in | Wt 177.2 lb

## 2020-09-28 DIAGNOSIS — K5909 Other constipation: Secondary | ICD-10-CM

## 2020-09-28 DIAGNOSIS — Z800 Family history of malignant neoplasm of digestive organs: Secondary | ICD-10-CM

## 2020-09-28 DIAGNOSIS — K582 Mixed irritable bowel syndrome: Secondary | ICD-10-CM

## 2020-09-28 MED ORDER — SUTAB 1479-225-188 MG PO TABS: 1.0000 | ORAL_TABLET | Freq: Once | ORAL | 0 refills | Status: AC

## 2020-09-28 NOTE — Patient Instructions (Signed)
You can try Align or Hardin Negus colon health over the counter Watch for any food triggers of the loose stools - keep journal as discussed.

## 2020-09-28 NOTE — Telephone Encounter (Signed)
Patient needs Sutab (copay card) ° °

## 2020-09-28 NOTE — Progress Notes (Signed)
Patient profile: Brittney [name] is a 49 y.o. female seen for follow-up-she was last seen July 2021 by Dr. Jenetta [name] for IBS with constipation started on Linzess.  Prior to that she was having to use over-the-counter laxatives for bowel movements. Initially prescribed 45mcg but dose was increased to 123mcg.   History of Present Illness: Brittney [name] is seen today for follow up. She reports linzess has helped but not completed resolved symptoms. She reports generally having 1-2 BM/day and sometimes will miss 1-2 days between stools. She repots consistency can vary, was very hard w/ 37mcg dose and varies w/ 168mcg dose from hard to loose. No abd pain or blood in stool. Notes more bowel sounds if having the looser stools.   Initially linzess caused nausea for week or so but this seemed to resolved. No vomiting. Rare GERD symptoms if eats spagetti-takes omeprazole very sledom. She takes baking soda PRN. Tries to eat fairly early to prevent GERD symptoms.   Non smoker. Very infrequent alcohol use. Uses ibuprofen on occasion for joint pain.     Wt Readings from Last 3 Encounters:  09/28/20 177 lb 3.2 oz (80.4 kg)  09/08/20 178 lb (80.7 kg)  07/17/20 179 lb 12.8 oz (81.6 kg)     Last PZW:CHENI Last Colonoscopy:2014 - Normal mucosa of terminal ileum. Scattered erosions at distal sigmoid colon and rectosigmoid junction. Biopsy taken, positive for erosions but no other findings. Small hemorrhoids below the dentate line along with single anal papilla.  Past Medical History:  Past Medical History:  Diagnosis Date  . Acid reflux   . Chest pain    intermittent x 1 yr  . Diabetic mellitus, gestational   . Hypertension    during pregnancy. Not treated for it now.  . IBS (irritable bowel syndrome)    dx in 2005 by primary MD  . IBS (irritable bowel syndrome)   . Obesity   . Seasonal allergies    yr roud; was immunothrapy in childhood, does not need any more at this time    Problem List: Patient  Active Problem List   Diagnosis Date Noted  . GERD (gastroesophageal reflux disease) 06/22/2020  . 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 History of gastroesophageal reflux (GERD) 11/18/2018  . Sjogren's syndrome with keratoconjunctivitis sicca ([place]) 11/04/2018  . Ulnar neuropathy of both upper extremities 11/04/2018  . 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 Essential hypertension, benign 07/12/2014  . Paresthesia of hand, bilateral 02/03/2014  . Generalized abdominal fullness 02/09/2013  . Fatigue 01/03/2010  . Morbid obesity ([place]) 06/22/2009  . ALLERGIC RHINITIS, SEASONAL 06/22/2009  . IBS (irritable bowel syndrome) 06/22/2009  . CHEST PAIN 06/22/2009    Past Surgical History: Past Surgical History:  Procedure Laterality Date  . CESAREAN SECTION W/BTL  2007  . cholectystectomy  2000  . CHOLECYSTECTOMY  2000  . COLONOSCOPY N/A 02/19/2013   Procedure: COLONOSCOPY;  Surgeon: Rogene Houston, MD;  Location: [facility];  Service: Endoscopy;  Laterality: N/A;  830    Allergies: Allergies  Allergen Reactions  . Miralax [Polyethylene Glycol] Swelling    Tongue swelling  . Vasotec [Enalapril]     cough      Home Medications:  Current Outpatient Medications:  .  Ascorbic Acid (VITAMIN C PO), Take by mouth daily., Disp: , Rfl:  .  BIOTIN PO, Take by mouth daily., Disp: , Rfl:  .  cholecalciferol (VITAMIN D) 1000 units tablet, Take 2,000 Units by mouth daily., Disp: ,  Rfl:  .  Collagen 500 MG CAPS, Take by mouth daily. This is in powder form, Disp: , Rfl:  .  linaclotide (LINZESS) 145 MCG CAPS capsule, Take 1 capsule (145 mcg total) by mouth daily., Disp: 90 capsule, Rfl: 1 .  loratadine (CLARITIN) 10 MG tablet, Take 10 mg by mouth daily., Disp: , Rfl:  .  losartan (COZAAR) 50 MG tablet, Take 1 tablet (50 mg total) by mouth daily., Disp: 90 tablet, Rfl: 1 .  Maca Root POWD, by Does not apply route., Disp: , Rfl:  .   MAGNESIUM PO, Take 400 mg by mouth daily. , Disp: , Rfl:  .  Multiple Vitamins-Minerals (MULTIVITAMIN PO), Take by mouth daily., Disp: , Rfl:  .  omega-3 acid ethyl esters (LOVAZA) 1 g capsule, Take 1 g by mouth daily., Disp: , Rfl:  .  omeprazole (PRILOSEC) 20 MG capsule, Take 20 mg by mouth as needed. , Disp: , Rfl:  .  TURMERIC PO, Take 375 mg by mouth daily. , Disp: , Rfl:  .  UNABLE TO FIND, Med Name: Bone Broth, Disp: , Rfl:    Family History: family history includes Allergies in her daughter and son; Cancer in her brother; Cancer (age of onset: 40) in her father; Eczema in her daughter and son; Healthy in her brother, sister, and sister; Kidney failure in her mother.    Social History:   reports that she has never smoked. She has never used smokeless tobacco. She reports current alcohol use. She reports that she does not use drugs.   Review of Systems: Constitutional: Denies weight loss/weight gain  Eyes: No changes in vision. ENT: No oral lesions, sore throat.  GI: see HPI.  Heme/Lymph: No easy bruising.  CV: No chest pain.  GU: No hematuria.  Integumentary: No rashes.  Neuro: No headaches.  Psych: No depression/anxiety.  Endocrine: No heat/cold intolerance.  Allergic/Immunologic: No urticaria.  Resp: No cough, SOB.  Musculoskeletal: No joint swelling.    Physical Examination: BP 138/84 (BP Location: Right Arm, Patient Position: Sitting, Cuff Size: Large)   Pulse 71   Temp 99.4 F (37.4 C) (Oral)   Ht 4\' 11"  (1.499 m)   Wt 177 lb 3.2 oz (80.4 kg)   BMI 35.79 kg/m  Gen: NAD, alert and oriented x 4 HEENT: PEERLA, EOMI, Neck: supple, no JVD Chest: CTA bilaterally, no wheezes, crackles, or other adventitious sounds CV: RRR, no m/g/c/r Abd: soft, NT, ND, +BS in all four quadrants; no HSM, guarding, ridigity, or rebound tenderness Ext: no edema, well perfused with 2+ pulses, Skin: no rash or lesions noted on observed skin Lymph: no noted LAD  Data  Reviewed:     Assessment/Plan: Ms. Brittney [name] is a 49 y.o. female seen for follow-up of constipation  1.  Constipation-doing fairly well on Linzess 145 mcg.  We discussed higher dose but she feels this dose works well.  We also discussed appropriate timing 30 minutes before meal.  She will focus on not eating a fatty meal after Linzess.  Continue current regimen.  2.  Family history of colon cancer in her father, last colonoscopy 2014 and she would like to schedule repeat.  She denies any prior issues with sedation.   Patient denies CP, SOB, and use of blood thinners. I discussed the risks and benefits of procedure including bleeding, perforation, infection, missed lesions, medication reactions and possible hospitalization or surgery if complications. All questions answered.   [place] was seen today for follow-up.  Diagnoses and  all orders for this visit:  Family history of colon cancer  Chronic constipation        I personally performed the service, non-incident to. (WP)  Brittney [name], [facility]

## 2020-09-29 DIAGNOSIS — Z800 Family history of malignant neoplasm of digestive organs: Secondary | ICD-10-CM | POA: Insufficient documentation

## 2020-10-12 ENCOUNTER — Other Ambulatory Visit (INDEPENDENT_AMBULATORY_CARE_PROVIDER_SITE_OTHER): Payer: Self-pay | Admitting: *Deleted

## 2020-10-30 ENCOUNTER — Other Ambulatory Visit (HOSPITAL_COMMUNITY)
Admission: RE | Admit: 2020-10-30 | Discharge: 2020-10-30 | Disposition: A | Discharge status: Home / Self Care | Payer: 59 | Source: Ambulatory Visit | Attending: Gastroenterology | Admitting: Gastroenterology

## 2020-10-30 ENCOUNTER — Other Ambulatory Visit: Payer: Self-pay

## 2020-10-30 DIAGNOSIS — Z01812 Encounter for preprocedural laboratory examination: Secondary | ICD-10-CM | POA: Diagnosis present

## 2020-10-30 DIAGNOSIS — Z20822 Contact with and (suspected) exposure to covid-19: Secondary | ICD-10-CM | POA: Insufficient documentation

## 2020-10-30 LAB — SARS CORONAVIRUS 2 (TAT 6-24 HRS): SARS Coronavirus 2: NEGATIVE

## 2020-10-31 ENCOUNTER — Ambulatory Visit (HOSPITAL_COMMUNITY)
Admission: RE | Admit: 2020-10-31 | Discharge: 2020-10-31 | Disposition: A | Discharge status: Home / Self Care | Payer: 59 | Attending: Internal Medicine | Admitting: Internal Medicine

## 2020-10-31 ENCOUNTER — Ambulatory Visit (HOSPITAL_COMMUNITY): Payer: 59 | Admitting: Anesthesiology

## 2020-10-31 ENCOUNTER — Encounter (HOSPITAL_COMMUNITY): Payer: Self-pay | Admitting: Internal Medicine

## 2020-10-31 ENCOUNTER — Other Ambulatory Visit: Payer: Self-pay

## 2020-10-31 ENCOUNTER — Encounter (HOSPITAL_COMMUNITY)
Admission: RE | Disposition: A | Discharge status: Home / Self Care | Payer: Self-pay | Source: Home / Self Care | Attending: Internal Medicine

## 2020-10-31 DIAGNOSIS — Z1211 Encounter for screening for malignant neoplasm of colon: Secondary | ICD-10-CM | POA: Diagnosis present

## 2020-10-31 DIAGNOSIS — Z800 Family history of malignant neoplasm of digestive organs: Secondary | ICD-10-CM | POA: Insufficient documentation

## 2020-10-31 DIAGNOSIS — K635 Polyp of colon: Secondary | ICD-10-CM | POA: Insufficient documentation

## 2020-10-31 DIAGNOSIS — D123 Benign neoplasm of transverse colon: Secondary | ICD-10-CM | POA: Diagnosis not present

## 2020-10-31 DIAGNOSIS — D1779 Benign lipomatous neoplasm of other sites: Secondary | ICD-10-CM | POA: Insufficient documentation

## 2020-10-31 DIAGNOSIS — K6289 Other specified diseases of anus and rectum: Secondary | ICD-10-CM | POA: Diagnosis not present

## 2020-10-31 DIAGNOSIS — D175 Benign lipomatous neoplasm of intra-abdominal organs: Secondary | ICD-10-CM

## 2020-10-31 DIAGNOSIS — Z8371 Family history of colonic polyps: Secondary | ICD-10-CM | POA: Diagnosis not present

## 2020-10-31 DIAGNOSIS — Z79899 Other long term (current) drug therapy: Secondary | ICD-10-CM | POA: Insufficient documentation

## 2020-10-31 HISTORY — PX: POLYPECTOMY: SHX5525

## 2020-10-31 HISTORY — PX: COLONOSCOPY WITH PROPOFOL: SHX5780

## 2020-10-31 LAB — PREGNANCY, URINE: Preg Test, Ur: NEGATIVE

## 2020-10-31 LAB — HM COLONOSCOPY

## 2020-10-31 SURGERY — COLONOSCOPY WITH PROPOFOL
Anesthesia: General

## 2020-10-31 MED ORDER — LACTATED RINGERS IV SOLN: INTRAVENOUS | Status: DC

## 2020-10-31 MED ORDER — PROPOFOL 500 MG/50ML IV EMUL
INTRAVENOUS | Status: DC | PRN
  Administered 2020-10-31: 150 ug/kg/min via INTRAVENOUS

## 2020-10-31 MED ORDER — PROPOFOL 10 MG/ML IV BOLUS
INTRAVENOUS | Status: DC | PRN
  Administered 2020-10-31: 50 mg via INTRAVENOUS
  Administered 2020-10-31: 20 mg via INTRAVENOUS

## 2020-10-31 MED ORDER — STERILE WATER FOR IRRIGATION IR SOLN
Status: DC | PRN
  Administered 2020-10-31: 1.5 mL

## 2020-10-31 NOTE — Transfer of Care (Signed)
Immediate Anesthesia Transfer of Care Note  Patient: Brittney [name]  Procedure(s) Performed: COLONOSCOPY WITH PROPOFOL (N/A ) POLYPECTOMY  Patient Location: Endoscopy Unit  Anesthesia Type:General  Level of Consciousness: awake  Airway & Oxygen Therapy: Patient Spontanous Breathing  Post-op Assessment: Report given to RN  Post vital signs: Reviewed  Last Vitals:  Vitals Value Taken Time  BP    Temp    Pulse    Resp    SpO2      Last Pain:  Vitals:   10/31/20 1009  TempSrc:   PainSc: 0-No pain      Patients Stated Pain Goal: 7 (35/59/74 1638)  Complications: No complications documented.

## 2020-10-31 NOTE — Op Note (Signed)
[facility] Patient Name: Brittney [name] Procedure Date: 10/31/2020 9:42 AM MRN: [id number] Date of Birth: October 31, 1971 Attending MD: Hildred Laser , MD CSN: [id number] Age: 49 Admit Type: Outpatient Procedure:                Colonoscopy Indications:              Colon cancer screening in patient with 1st-degree                            relative having advanced adenoma of the colon at                            age 56 (or older), Screening in patient at                            increased risk: Colorectal cancer in father 38 or                            older Providers:                Hildred Laser, MD, Caprice Kluver, Aram Candela Referring MD:             Elayne Snare Luking, MD Medicines:                Propofol per Anesthesia Complications:            No immediate complications. Estimated Blood Loss:     Estimated blood loss was minimal. Procedure:                Pre-Anesthesia Assessment:                           - Prior to the procedure, a History and Physical                            was performed, and patient medications and                            allergies were reviewed. The patient's tolerance of                            previous anesthesia was also reviewed. The risks                            and benefits of the procedure and the sedation                            options and risks were discussed with the patient.                            All questions were answered, and informed consent                            was obtained. Prior Anticoagulants: The patient has  taken no previous anticoagulant or antiplatelet                            agents. ASA Grade Assessment: II - A patient with                            mild systemic disease. After reviewing the risks                            and benefits, the patient was deemed in                            satisfactory condition to undergo the procedure.                           After  obtaining informed consent, the colonoscope                            was passed under direct vision. Throughout the                            procedure, the patient's blood pressure, pulse, and                            oxygen saturations were monitored continuously. The                            [id number]) was introduced through the anus                            and advanced to the the cecum, identified by                            appendiceal orifice and ileocecal valve. The                            colonoscopy was performed without difficulty. The                            patient tolerated the procedure well. The quality                            of the bowel preparation was good. The ileocecal                            valve, appendiceal orifice, and rectum were                            photographed. Scope In: 10:14:58 AM Scope Out: 10:37:29 AM Scope Withdrawal Time: 0 hours 19 minutes 35 seconds  Total Procedure Duration: 0 hours 22 minutes 31 seconds  Findings:      The perianal and digital rectal examinations were normal.      Three sessile polyps were found in the hepatic flexure. The polyps were  small in size. These polyps were removed with a cold snare. Resection       and retrieval were complete. The pathology specimen was placed into       Bottle Number 1.      Two polyps were found in the hepatic flexure. The polyps were small in       size. These were biopsied with a cold forceps for histology. The       pathology specimen was placed into Bottle Number 1.      The exam was otherwise normal throughout the examined colon.      Anal papilla(e) were hypertrophied.      There was a small lipoma, at the anus. Impression:               - Three small polyps at the hepatic flexure,                            removed with a cold snare. Resected and retrieved.                           - Two small polyps at the hepatic flexure. Biopsied.                            - Anal papilla(e) were hypertrophied.                           - Small lipoma at the anus. Moderate Sedation:      Per Anesthesia Care Recommendation:           - Patient has a contact number available for                            emergencies. The signs and symptoms of potential                            delayed complications were discussed with the                            patient. Return to normal activities tomorrow.                            Written discharge instructions were provided to the                            patient.                           - High fiber diet today.                           - Continue present medications.                           - No aspirin, ibuprofen, naproxen, or other                            non-steroidal anti-inflammatory drugs for 1 day.                           -  Await pathology results.                           - Repeat colonoscopy for surveillance based on                            pathology results. Procedure Code(s):        --- Professional ---                           [phone], Colonoscopy, flexible; with removal of                            tumor(s), polyp(s), or other lesion(s) by snare                            technique                           [id number], 34, Colonoscopy, flexible; with biopsy,                            single or multiple Diagnosis Code(s):        --- Professional ---                           Z83.71, Family history of colonic polyps                           Z80.0, Family history of malignant neoplasm of                            digestive organs                           K63.5, Polyp of colon                           K62.89, Other specified diseases of anus and rectum                           D17.5, Benign lipomatous neoplasm of                            intra-abdominal organs CPT copyright 2019 [employer]. All rights reserved. The codes documented in this report are  preliminary and upon coder review may  be revised to meet current compliance requirements. Hildred Laser, MD Hildred Laser, MD 10/31/2020 10:47:54 AM This report has been signed electronically. Number of Addenda: 0

## 2020-10-31 NOTE — Discharge Instructions (Signed)
No aspirin or NSAIDs for 24 hours. Resume usual medications as before. High-fiber diet No driving for 24 hours. Physician will call with biopsy results and further recommendations.   Colonoscopy, Adult, Care After This sheet gives you information about how to care for yourself after your procedure. Your doctor may also give you more specific instructions. If you have problems or questions, call your doctor. What can I expect after the procedure? After the procedure, it is common to have:  A small amount of blood in your poop (stool) for 24 hours.  Some gas.  Mild cramping or bloating in your belly (abdomen). Follow these instructions at home: Eating and drinking   Drink enough fluid to keep your pee (urine) pale yellow.  Follow instructions from your doctor about what you cannot eat or drink.  Return to your normal diet as told by your doctor. Avoid heavy or fried foods that are hard to digest. Activity  Rest as told by your doctor.  Do not sit for a long time without moving. Get up to take short walks every 1-2 hours. This is important. Ask for help if you feel weak or unsteady.  Return to your normal activities as told by your doctor. Ask your doctor what activities are safe for you. To help cramping and bloating:   Try walking around.  Put heat on your belly as told by your doctor. Use the heat source that your doctor recommends, such as a moist heat pack or a heating pad. ? Put a towel between your skin and the heat source. ? Leave the heat on for 20-30 minutes. ? Remove the heat if your skin turns bright red. This is very important if you are unable to feel pain, heat, or cold. You may have a greater risk of getting burned. General instructions  For the first 24 hours after the procedure: ? Do not drive or use machinery. ? Do not sign important documents. ? Do not drink alcohol. ? Do your daily activities more slowly than normal. ? Eat foods that are soft and  easy to digest.  Take over-the-counter or prescription medicines only as told by your doctor.  Keep all follow-up visits as told by your doctor. This is important. Contact a doctor if:  You have blood in your poop 2-3 days after the procedure. Get help right away if:  You have more than a small amount of blood in your poop.  You see large clumps of tissue (blood clots) in your poop.  Your belly is swollen.  You feel like you may vomit (nauseous).  You vomit.  You have a fever.  You have belly pain that gets worse, and medicine does not help your pain. Summary  After the procedure, it is common to have a small amount of blood in your poop. You may also have mild cramping and bloating in your belly.  For the first 24 hours after the procedure, do not drive or use machinery, do not sign important documents, and do not drink alcohol.  Get help right away if you have a lot of blood in your poop, feel like you may vomit, have a fever, or have more belly pain. This information is not intended to replace advice given to you by your health care provider. Make sure you discuss any questions you have with your health care provider. Document Revised: 06/14/2019 Document Reviewed: 06/14/2019 Elsevier Patient Education  [place].  Colon Polyps  Polyps are tissue growths inside the  body. Polyps can grow in many places, including the large intestine (colon). A polyp may be a round bump or a mushroom-shaped growth. You could have one polyp or several. Most colon polyps are noncancerous (benign). However, some colon polyps can become cancerous over time. Finding and removing the polyps early can help prevent this. What are the causes? The exact cause of colon polyps is not known. What increases the risk? You are more likely to develop this condition if you:  Have a family history of colon cancer or colon polyps.  Are older than 43 or older than 45 if you are African  American.  Have inflammatory bowel disease, such as ulcerative colitis or Crohn's disease.  Have certain hereditary conditions, such as: ? Familial adenomatous polyposis. ? Lynch syndrome. ? Turcot syndrome. ? Peutz-Jeghers syndrome.  Are overweight.  Smoke cigarettes.  Do not get enough exercise.  Drink too much alcohol.  Eat a diet that is high in fat and red meat and low in fiber.  Had childhood cancer that was treated with abdominal radiation. What are the signs or symptoms? Most polyps do not cause symptoms. If you have symptoms, they may include:  Blood coming from your rectum when having a bowel movement.  Blood in your stool. The stool may look dark red or black.  Abdominal pain.  A change in bowel habits, such as constipation or diarrhea. How is this diagnosed? This condition is diagnosed with a colonoscopy. This is a procedure in which a lighted, flexible scope is inserted into the anus and then passed into the colon to examine the area. Polyps are sometimes found when a colonoscopy is done as part of routine cancer screening tests. How is this treated? Treatment for this condition involves removing any polyps that are found. Most polyps can be removed during a colonoscopy. Those polyps will then be tested for cancer. Additional treatment may be needed depending on the results of testing. Follow these instructions at home: Lifestyle  Maintain a healthy weight, or lose weight if recommended by your health care provider.  Exercise every day or as told by your health care provider.  Do not use any products that contain nicotine or tobacco, such as cigarettes and e-cigarettes. If you need help quitting, ask your health care provider.  If you drink alcohol, limit how much you have: ? 0-1 drink a day for women. ? 0-2 drinks a day for men.  Be aware of how much alcohol is in your drink. In the [place] one drink equals one 12 oz bottle of beer (355 mL), one 5 oz glass  of wine (148 mL), or one 1 oz shot of hard liquor (44 mL). Eating and drinking   Eat foods that are high in fiber, such as fruits, vegetables, and whole grains.  Eat foods that are high in calcium and vitamin D, such as milk, cheese, yogurt, eggs, liver, fish, and broccoli.  Limit foods that are high in fat, such as fried foods and desserts.  Limit the amount of red meat and processed meat you eat, such as hot dogs, sausage, bacon, and lunch meats. General instructions  Keep all follow-up visits as told by your health care provider. This is important. ? This includes having regularly scheduled colonoscopies. ? Talk to your health care provider about when you need a colonoscopy. Contact a health care provider if:  You have new or worsening bleeding during a bowel movement.  You have new or increased blood in your stool.  You have a change in bowel habits.  You lose weight for no known reason. Summary  Polyps are tissue growths inside the body. Polyps can grow in many places, including the colon.  Most colon polyps are noncancerous (benign), but some can become cancerous over time.  This condition is diagnosed with a colonoscopy.  Treatment for this condition involves removing any polyps that are found. Most polyps can be removed during a colonoscopy. This information is not intended to replace advice given to you by your health care provider. Make sure you discuss any questions you have with your health care provider. Document Revised: 03/05/2018 Document Reviewed: 03/05/2018 Elsevier Patient Education  [place].   High-Fiber Diet Fiber, also called dietary fiber, is a type of carbohydrate that is found in fruits, vegetables, whole grains, and beans. A high-fiber diet can have many health benefits. Your health care provider may recommend a high-fiber diet to help:  Prevent constipation. Fiber can make your bowel movements more regular.  Lower your  cholesterol.  Relieve the following conditions: ? Swelling of veins in the anus (hemorrhoids). ? Swelling and irritation (inflammation) of specific areas of the digestive tract (uncomplicated diverticulosis). ? A problem of the large intestine (colon) that sometimes causes pain and diarrhea (irritable bowel syndrome, IBS).  Prevent overeating as part of a weight-loss plan.  Prevent heart disease, type 2 diabetes, and certain cancers. What is my plan? The recommended daily fiber intake in grams (g) includes:  38 g for men age 7 or younger.  30 g for men over age 75.  3 g for women age 21 or younger.  21 g for women over age 28. You can get the recommended daily intake of dietary fiber by:  Eating a variety of fruits, vegetables, grains, and beans.  Taking a fiber supplement, if it is not possible to get enough fiber through your diet. What do I need to know about a high-fiber diet?  It is better to get fiber through food sources rather than from fiber supplements. There is not a lot of research about how effective supplements are.  Always check the fiber content on the nutrition facts label of any prepackaged food. Look for foods that contain 5 g of fiber or more per serving.  Talk with a diet and nutrition specialist (dietitian) if you have questions about specific foods that are recommended or not recommended for your medical condition, especially if those foods are not listed below.  Gradually increase how much fiber you consume. If you increase your intake of dietary fiber too quickly, you may have bloating, cramping, or gas.  Drink plenty of water. Water helps you to digest fiber. What are tips for following this plan?  Eat a wide variety of high-fiber foods.  Make sure that half of the grains that you eat each day are whole grains.  Eat breads and cereals that are made with whole-grain flour instead of refined flour or white flour.  Eat brown rice, bulgur wheat, or  millet instead of white rice.  Start the day with a breakfast that is high in fiber, such as a cereal that contains 5 g of fiber or more per serving.  Use beans in place of meat in soups, salads, and pasta dishes.  Eat high-fiber snacks, such as berries, raw vegetables, nuts, and popcorn.  Choose whole fruits and vegetables instead of processed forms like juice or sauce. What foods can I eat?  Fruits Berries. Pears. Apples. Oranges. Avocado. Prunes  and raisins. Dried figs. Vegetables Sweet potatoes. Spinach. Kale. Artichokes. Cabbage. Broccoli. Cauliflower. Green peas. Carrots. Squash. Grains Whole-grain breads. Multigrain cereal. Oats and oatmeal. Brown rice. Barley. Bulgur wheat. [place]. Quinoa. Bran muffins. Popcorn. Rye wafer crackers. Meats and other proteins Navy, kidney, and pinto beans. Soybeans. Split peas. Lentils. Nuts and seeds. Dairy Fiber-fortified yogurt. Beverages Fiber-fortified soy milk. Fiber-fortified orange juice. Other foods Fiber bars. The items listed above may not be a complete list of recommended foods and beverages. Contact a dietitian for more options. What foods are not recommended? Fruits Fruit juice. Cooked, strained fruit. Vegetables Fried potatoes. Canned vegetables. Well-cooked vegetables. Grains White bread. Pasta made with refined flour. White rice. Meats and other proteins Fatty cuts of meat. Fried chicken or fried fish. Dairy Milk. Yogurt. Cream cheese. Sour cream. Fats and oils Butters. Beverages Soft drinks. Other foods Cakes and pastries. The items listed above may not be a complete list of foods and beverages to avoid. Contact a dietitian for more information. Summary  Fiber is a type of carbohydrate. It is found in fruits, vegetables, whole grains, and beans.  There are many health benefits of eating a high-fiber diet, such as preventing constipation, lowering blood cholesterol, helping with weight loss, and reducing your risk  of heart disease, diabetes, and certain cancers.  Gradually increase your intake of fiber. Increasing too fast can result in cramping, bloating, and gas. Drink plenty of water while you increase your fiber.  The best sources of fiber include whole fruits and vegetables, whole grains, nuts, seeds, and beans. This information is not intended to replace advice given to you by your health care provider. Make sure you discuss any questions you have with your health care provider. Document Revised: 09/22/2017 Document Reviewed: 09/22/2017 Elsevier Patient Education  2020 [employer].

## 2020-10-31 NOTE — Anesthesia Preprocedure Evaluation (Signed)
Anesthesia Evaluation  Patient identified by MRN, date of birth, ID band Patient awake    Reviewed: Allergy & Precautions, H&P , NPO status , Patient's Chart, lab work & pertinent test results, reviewed documented beta blocker date and time   Airway Mallampati: II  TM Distance: >3 FB Neck ROM: full    Dental no notable dental hx.    Pulmonary neg pulmonary ROS,    Pulmonary exam normal breath sounds clear to auscultation       Cardiovascular Exercise Tolerance: Good hypertension, negative cardio ROS   Rhythm:regular Rate:Normal     Neuro/Psych  Neuromuscular disease negative psych ROS   GI/Hepatic Neg liver ROS, GERD  Medicated,  Endo/Other  negative endocrine ROSdiabetes  Renal/GU negative Renal ROS  negative genitourinary   Musculoskeletal   Abdominal   Peds  Hematology negative hematology ROS (+)   Anesthesia Other Findings   Reproductive/Obstetrics negative OB ROS                             Anesthesia Physical Anesthesia Plan  ASA: II  Anesthesia Plan: General   Post-op Pain Management:    Induction:   PONV Risk Score and Plan: Propofol infusion  Airway Management Planned:   Additional Equipment:   Intra-op Plan:   Post-operative Plan:   Informed Consent: I have reviewed the patients History and Physical, chart, labs and discussed the procedure including the risks, benefits and alternatives for the proposed anesthesia with the patient or authorized representative who has indicated his/her understanding and acceptance.     Dental Advisory Given  Plan Discussed with: CRNA  Anesthesia Plan Comments:         Anesthesia Quick Evaluation

## 2020-10-31 NOTE — Anesthesia Postprocedure Evaluation (Signed)
Anesthesia Post Note  Patient: Brittney [name]  Procedure(s) Performed: COLONOSCOPY WITH PROPOFOL (N/A ) POLYPECTOMY  Patient location during evaluation: Endoscopy Anesthesia Type: General Level of consciousness: awake and alert Pain management: pain level controlled Vital Signs Assessment: post-procedure vital signs reviewed and stable Respiratory status: spontaneous breathing Cardiovascular status: stable Postop Assessment: no apparent nausea or vomiting Anesthetic complications: no   No complications documented.   Last Vitals:  Vitals:   10/31/20 0825  BP: 128/74  Pulse: 74  Resp: 19  Temp: 37.1 C  SpO2: 100%    Last Pain:  Vitals:   10/31/20 1009  TempSrc:   PainSc: 0-No pain                 Samentha Perham

## 2020-10-31 NOTE — H&P (Signed)
Brittney [name] is an 49 y.o. female.   Chief Complaint: Patient is here for colonoscopy HPI: Patient is 49 year old African American female who is here for screening colonoscopy.  She denies abdominal pain change in bowel habits or rectal bleeding.  He has chronic constipation.  She says she is doing well with Linzess.  Last colonoscopy was in 2000. History significant for colonic polyps and colon carcinoma in her father.  He was diagnosed with colon cancer around age 33.  He lived to be 31. Patient does not take aspirin or anticoagulants  Past Medical History:  Diagnosis Date  . Acid reflux   . Chest pain    intermittent x 1 yr  . Diabetic mellitus, gestational   . Hypertension    during pregnancy. Not treated for it now.  . IBS (irritable bowel syndrome)    dx in 2005 by primary MD  . IBS (irritable bowel syndrome)   . Obesity   . Seasonal allergies    yr roud; was immunothrapy in childhood, does not need any more at this time    Past Surgical History:  Procedure Laterality Date  . CESAREAN SECTION W/BTL  2007  . cholectystectomy  2000  . CHOLECYSTECTOMY  2000  . COLONOSCOPY N/A 02/19/2013   Procedure: COLONOSCOPY;  Surgeon: Rogene Houston, MD;  Location: [facility];  Service: Endoscopy;  Laterality: N/A;  49    Family History  Problem Relation Age of Onset  . Kidney failure Mother   . Cancer Father 50       colon cancer  . Healthy Sister   . Healthy Brother   . Cancer Brother   . Healthy Sister   . Eczema Daughter   . Allergies Daughter   . Eczema Son   . Allergies Son    Social History:  reports that she has never smoked. She has never used smokeless tobacco. She reports current alcohol use. She reports that she does not use drugs.  Allergies:  Allergies  Allergen Reactions  . Miralax [Polyethylene Glycol] Swelling    Tongue swelling  . Vasotec [Enalapril]     cough    Medications Prior to Admission  Medication Sig Dispense Refill  . Ascorbic Acid  (VITAMIN C PO) Place 2,000 mg under the tongue daily.     Marland Kitchen BIOTIN PO Take 1,000 mcg by mouth daily.     . Cholecalciferol (VITAMIN D) 125 MCG (5000 UT) CAPS Take 5,000 Units by mouth daily.     . Collagen 500 MG CAPS Take by mouth daily. Bone Broth  Scoop = 15 grams    . linaclotide (LINZESS) 145 MCG CAPS capsule Take 1 capsule (145 mcg total) by mouth daily. 90 capsule 1  . Maca Root POWD 5 mLs by Does not apply route daily.     Marland Kitchen MAGNESIUM PO Take 400 mg by mouth daily.     . Multiple Vitamins-Minerals (MULTIVITAMIN PO) Take 1 tablet by mouth daily. Alive for Woman 50 +    . Omega-3 Fatty Acids (FISH OIL PO) Take 400 mg by mouth daily.    . TURMERIC PO Take 505 mg by mouth daily.     Marland Kitchen UNABLE TO FIND Take 10 mg by mouth at bedtime. CBD gummie    . losartan (COZAAR) 50 MG tablet Take 1 tablet (50 mg total) by mouth daily. 90 tablet 1    Results for orders placed or performed during the hospital encounter of 10/31/20 (from the past 48 hour(s))  Pregnancy, urine     Status: None   Collection Time: 10/31/20  9:22 AM  Result Value Ref Range   Preg Test, Ur NEGATIVE NEGATIVE    Comment:        THE SENSITIVITY OF THIS METHODOLOGY IS >20 mIU/mL. Performed at [facility], [address] [place], [place] [address]    No results found.  Review of Systems  Blood pressure 128/74, pulse 74, temperature 98.7 F (50.1 C), temperature source Oral, resp. rate 19, height 4' 11.5" (1.511 m), weight 78.9 kg, last menstrual period 10/08/2020, SpO2 100 %. Physical Exam HENT:     Mouth/Throat:     Mouth: Mucous membranes are moist.     Pharynx: Oropharynx is clear.  Eyes:     General: No scleral icterus.    Conjunctiva/sclera: Conjunctivae normal.  Cardiovascular:     Rate and Rhythm: Normal rate and regular rhythm.     Heart sounds: Normal heart sounds. No murmur heard.   Pulmonary:     Effort: Pulmonary effort is normal.     Breath sounds: Normal breath sounds.  Abdominal:      Comments: Abdomen is symmetrical with Pfannenstiel scar.  She also has laparoscopy scars in right upper quadrant. Brittney [name] is soft and nontender with organomegaly or masses.  Musculoskeletal:     Cervical back: Neck supple.  Lymphadenopathy:     Cervical: No cervical adenopathy.  Neurological:     Mental Status: She is alert.      Assessment/Plan  High risk screening colonoscopy. Father with history of colon carcinoma as well as advanced adenomas late onset  Hildred Laser, MD 10/31/2020, 10:07 AM

## 2020-11-01 LAB — SURGICAL PATHOLOGY

## 2020-11-02 ENCOUNTER — Encounter (HOSPITAL_COMMUNITY): Payer: Self-pay | Admitting: Internal Medicine

## 2020-11-15 ENCOUNTER — Telehealth: Payer: Self-pay | Admitting: *Deleted

## 2020-11-15 NOTE — Telephone Encounter (Signed)
Received labs from Dr. Garwin Brothers Drawn on 10/27/2020 Reviewed by Hazel Sams, PA-C  ANA positive SS-A 1.3 Centromere > 8.0 Hgb A1C 5.8  We have not seen patient since 07/2019. Per Lovena Le please schedule patient for follow up if she would like to discuss results.  Attempted to contact the patient and left message for patient to call the office.

## 2020-11-15 NOTE — Progress Notes (Signed)
Office Visit Note  Patient: Brittney [name]             Date of Birth: September 27, 1971           MRN: [id number]             PCP: Kathyrn Drown, MD Referring: Kathyrn Drown, MD Visit Date: 11/17/2020 Occupation: @GUAROCC @  Subjective:  Discuss lab work   History of Present Illness: Brittney [name] is a 49 y.o. female with history of Sjogren's syndrome.  She is not taking any immunosuppressive medications at this.  We discussed starting on plaquenil in the August 2020 but she declined.  She had updated lab work on 10/27/20, which she would like to review today in the office.  Patient ports that she has been experiencing increased pain in multiple joints including both hands, both elbows, both knees, and both feet.  She denies any joint swelling at this time.  She states that she has following up with a podiatrist on a regular basis.  She has noticed an increase stiffness in her hips as well as her neck and lower back.  She has been experiencing increased lower back pain over the past 1 month.  She has not had any recent injuries or falls.  She denies any symptoms of radiculopathy.  She states that with certain activities she feels as though her spine" shifts."  She has been taking tylenol as needed for pain relief.   She has ongoing mild sicca symptoms.  Denies any oral or nasal ulcerations.  She denies any symptoms of raynaud's.  She denies any recent rashes.  She denies any increased photosensitivity.  She experiences palpitations intermittently.  According to the patient she has been evaluated by a cardiologist in the past and the work-up was unremarkable.    Activities of Daily Living:  Patient reports morning stiffness for 10 minutes.   Patient Reports nocturnal pain.  Difficulty dressing/grooming: Reports Difficulty climbing stairs: Denies Difficulty getting out of chair: Denies Difficulty using hands for taps, buttons, cutlery, and/or writing: Reports  Review of Systems  Constitutional:  Positive for fatigue.  HENT: Positive for mouth dryness and nose dryness. Negative for mouth sores.   Eyes: Positive for dryness. Negative for pain and visual disturbance.  Respiratory: Negative for cough, hemoptysis, shortness of breath and difficulty breathing.   Cardiovascular: Positive for chest pain, palpitations and swelling in legs/feet. Negative for hypertension.  Gastrointestinal: Negative for blood in stool, constipation and diarrhea.  Endocrine: Negative for increased urination.  Genitourinary: Negative for painful urination.  Musculoskeletal: Positive for arthralgias, joint pain, myalgias, muscle weakness, morning stiffness, muscle tenderness and myalgias. Negative for joint swelling.  Skin: Positive for hair loss. Negative for color change, pallor, rash, nodules/bumps, skin tightness, ulcers and sensitivity to sunlight.  Allergic/Immunologic: Negative for susceptible to infections.  Neurological: Positive for dizziness. Negative for numbness, headaches and weakness.  Hematological: Negative for swollen glands.  Psychiatric/Behavioral: Positive for depressed mood and sleep disturbance. The patient is nervous/anxious.     PMFS History:  Patient Active Problem List   Diagnosis Date Noted  . Family history of colon cancer 09/29/2020  . GERD (gastroesophageal reflux disease) 06/22/2020  . 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 History of gastroesophageal reflux (GERD) 11/18/2018  . Sjogren's syndrome with keratoconjunctivitis sicca ([place]) 11/04/2018  . Ulnar neuropathy of both upper extremities 11/04/2018  . 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 Essential hypertension, benign 07/12/2014  . Paresthesia of hand, bilateral  02/03/2014  . Generalized abdominal fullness 02/09/2013  . Fatigue 01/03/2010  . Morbid obesity ([place]) 06/22/2009  . ALLERGIC RHINITIS, SEASONAL 06/22/2009  . IBS (irritable bowel syndrome) 06/22/2009   . CHEST PAIN 06/22/2009    Past Medical History:  Diagnosis Date  . Acid reflux   . Chest pain    intermittent x 1 yr  . Diabetic mellitus, gestational   . Hypertension    during pregnancy. Not treated for it now.  . IBS (irritable bowel syndrome)    dx in 2005 by primary MD  . IBS (irritable bowel syndrome)   . Obesity   . Seasonal allergies    yr roud; was immunothrapy in childhood, does not need any more at this time    Family History  Problem Relation Age of Onset  . Kidney failure Mother   . Cancer Father 8       colon cancer  . Healthy Sister   . Healthy Brother   . Cancer Brother   . Healthy Sister   . Eczema Daughter   . Allergies Daughter   . Eczema Son   . Allergies Son    Past Surgical History:  Procedure Laterality Date  . CESAREAN SECTION W/BTL  2007  . cholectystectomy  2000  . CHOLECYSTECTOMY  2000  . COLONOSCOPY N/A 02/19/2013   Procedure: COLONOSCOPY;  Surgeon: Rogene Houston, MD;  Location: [facility];  Service: Endoscopy;  Laterality: N/A;  830  . COLONOSCOPY WITH PROPOFOL N/A 10/31/2020   Procedure: COLONOSCOPY WITH PROPOFOL;  Surgeon: Rogene Houston, MD;  Location: [facility];  Service: Gastroenterology;  Laterality: N/A;  1130  . POLYPECTOMY  10/31/2020   Procedure: POLYPECTOMY;  Surgeon: Rogene Houston, MD;  Location: [facility];  Service: Gastroenterology;;   Social History   Social History Narrative   Married 14 years, 2 children ages 38 and 70.    Job: Pt accounting    Gets regular exercise.    Right Handed   Drinks Caffeine   Immunization History  Administered Date(s) Administered  . Influenza-Unspecified 09/17/2017, 10/26/2019  . Moderna Sars-Covid-2 Vaccination 01/26/2020, 02/23/2020     Objective: Vital Signs: BP (!) 145/86 (BP Location: Left Arm, Patient Position: Sitting, Cuff Size: Normal)   Pulse 69   Ht 4\' 11"  (1.499 m)   Wt 178 lb 3.2 oz (80.8 kg)   BMI 35.99 kg/m    Physical Exam Vitals and nursing  note reviewed.  Constitutional:      Appearance: She is well-developed and well-nourished.  HENT:     Head: Normocephalic and atraumatic.  Eyes:     Extraocular Movements: EOM normal.     Conjunctiva/sclera: Conjunctivae normal.  Cardiovascular:     Pulses: Intact distal pulses.  Pulmonary:     Effort: Pulmonary effort is normal.  Abdominal:     Palpations: Abdomen is soft.  Musculoskeletal:     Cervical back: Normal range of motion.  Skin:    General: Skin is warm and dry.     Capillary Refill: Capillary refill takes less than 2 seconds.  Neurological:     Mental Status: She is alert and oriented to person, place, and time.  Psychiatric:        Mood and Affect: Mood and affect normal.        Behavior: Behavior normal.      Musculoskeletal Exam: C-spine, thoracic spine, and lumbar spine good ROM. Midline spinal tenderness in the lumbar region. No SI joint tenderness.  Shoulder joints, elbow joints, wrist joints, MCPs, PIPs, and DIPs good ROM with no synovitis.  Complete fist formation bilaterally. Hip joints, knee joints, ankle joints good ROM with no discomfort.  No warmth or effusion of knee joints.  No tenderness or swelling of ankle joints.  CDAI Exam: CDAI Score: -- Patient Global: --; Provider Global: -- Swollen: --; Tender: -- Joint Exam 11/17/2020   No joint exam has been documented for this visit   There is currently no information documented on the homunculus. Go to the Rheumatology activity and complete the homunculus joint exam.  Investigation: No additional findings.  Imaging: No results found.  Recent Labs: Lab Results  Component Value Date   WBC 7.3 07/05/2020   HGB 12.9 07/05/2020   PLT 302 07/05/2020   NA 138 07/05/2020   K 4.5 07/05/2020   CL 99 07/05/2020   CO2 25 07/05/2020   GLUCOSE 103 (H) 07/05/2020   BUN 7 07/05/2020   CREATININE 0.65 07/05/2020   BILITOT 0.2 07/05/2020   ALKPHOS 47 (L) 07/05/2020   AST 18 07/05/2020   ALT 13  07/05/2020   PROT 7.2 07/05/2020   ALBUMIN 4.5 07/05/2020   CALCIUM 9.8 07/05/2020   GFRAA 120 07/05/2020    Speciality Comments: No specialty comments available.  Procedures:  No procedures performed Allergies: Miralax [polyethylene glycol] and Vasotec [enalapril]   Assessment / Plan:     Visit Diagnoses: Sjogren's syndrome with keratoconjunctivitis sicca ([facility]) - ANA 1: 160 nuclear and centromere pattern, positive anti-Ro antibody.  Rest of the ENA negative, chronic mild sicca symptoms: She has persistent but mild sicca symptoms.  She has not needed to use any over-the-counter products for symptomatic relief recently.  She is not taking any immunosuppressive agents.  We discussed starting her on Plaquenil in August 2020 but she declined treatment at that time.  She has been experiencing increased arthralgias in multiple joints including both elbows, both hands, both knees, and both feet.  She has no synovitis on examination today.  Her discomfort is likely due to underlying osteoarthritis.  She has no signs or symptoms of inflammatory arthritis at this time.  She had updated lab work ordered by her gynecologist on 10/27/2020 which revealed positive ANA, positive Ro antibody, and centromere antibody positive.  She has no other clinical features of autoimmune disease at this time.  She does not want to start on Plaquenil or any other immunosuppressive agents at this time.  She plans on continuing to take natural anti-inflammatories.  She was advised to notify us if she develops any new or worsening symptoms.  She will follow-up in the office in 1 year and we will repeat lab work at that time.  High risk medication use - Declined Plaquenil in August 2020 and today on 11/17/20.   Primary osteoarthritis of both hands: She experiences intermittent pain and stiffness in her hands.  No joint tenderness or synovitis was noted on exam.  She was able to make a complete fist bilaterally.  Joint protection and  muscle strengthening were discussed.  She was encouraged to continue to take natural anti-inflammatories.  Chondromalacia patellae, right knee: She has good range of motion of the right knee joint on exam today.  No warmth or effusion was noted.  She experiences occasional discomfort in both knees.  She has been exercising on a regular basis.  Chondromalacia, patella, left: She has good range of motion of the left knee joint on exam.  No warmth or effusion was noted.  Experiences occasional discomfort in the left knee joint which is typically exacerbated by strenuous activities.  Neck pain:  She has been experiencing increased neck pain and stiffness recently.  She has noticed crepitus and is shifting sensation in neck with ROM.  She has no symptoms of radiculopathy at this time.  X-rays of the C-spine were obtained today which revealed multilevel spondylosis and anterior spurring.  She declined a referral to physical therapy.  She was given a handout of neck exercises to perform.  She was advised to notify us if her discomfort persists or worsens.- Plan: XR Cervical Spine 2 or 3 views  Chronic midline low back pain without sciatica: She presents today with increased lower back pain which started about 1 month ago.  She is not experiencing any symptoms of radiculopathy at this time.  She has good range of motion on examination today.  Some stiffness with extension of the lumbar spine noted.  She has midline spinal tenderness in the lumbar region.  No SI joint tenderness.  X-rays of the lumbar spine were obtained today which were consistent with dextroscoliosis and facet joint arthropathy.  She declined a referral to physical therapy.  She was given a handout of back exercises and core strengthening exercises. She was advised to notify us if her discomfort persists or worsens.   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   Ulnar neuropathy of both upper extremities  Hypermobility arthralgia  Essential hypertension, benign  History of IBS  History of gastroesophageal reflux (GERD)  Orders: Orders Placed This Encounter  Procedures  . XR Lumbar Spine 2-3 Views  . XR Cervical Spine 2 or 3 views   No orders of the defined types were placed in this encounter.     Follow-Up Instructions: Return in about 1 year (around 11/17/2021) for Sjogren's syndrome, Osteoarthritis.   Ofilia Neas, PA-C  Note - This record has been created using Dragon software.  Chart creation errors have been sought, but may not always  have been located. Such creation errors do not reflect on  the standard of medical care.

## 2020-11-17 ENCOUNTER — Ambulatory Visit: Payer: 59 | Admitting: Physician Assistant

## 2020-11-17 ENCOUNTER — Encounter: Payer: Self-pay | Admitting: Physician Assistant

## 2020-11-17 ENCOUNTER — Ambulatory Visit: Payer: Self-pay

## 2020-11-17 ENCOUNTER — Other Ambulatory Visit: Payer: Self-pay

## 2020-11-17 VITALS — BP 145/86 | HR 69 | Ht 59.0 in | Wt 178.2 lb

## 2020-11-17 DIAGNOSIS — M542 Cervicalgia: Secondary | ICD-10-CM | POA: Diagnosis not present

## 2020-11-17 DIAGNOSIS — M19042 Primary osteoarthritis, left hand: Secondary | ICD-10-CM

## 2020-11-17 DIAGNOSIS — M545 Low back pain, unspecified: Secondary | ICD-10-CM | POA: Diagnosis not present

## 2020-11-17 DIAGNOSIS — G5623 Lesion of ulnar nerve, bilateral upper limbs: Secondary | ICD-10-CM

## 2020-11-17 DIAGNOSIS — G8929 Other chronic pain: Secondary | ICD-10-CM | POA: Diagnosis not present

## 2020-11-17 DIAGNOSIS — M2550 Pain in unspecified joint: Secondary | ICD-10-CM

## 2020-11-17 DIAGNOSIS — M19041 Primary osteoarthritis, right hand: Secondary | ICD-10-CM | POA: Diagnosis not present

## 2020-11-17 DIAGNOSIS — Z79899 Other long term (current) drug therapy: Secondary | ICD-10-CM

## 2020-11-17 DIAGNOSIS — M2241 Chondromalacia patellae, right knee: Secondary | ICD-10-CM

## 2020-11-17 DIAGNOSIS — G600 Hereditary motor and sensory neuropathy: Secondary | ICD-10-CM

## 2020-11-17 DIAGNOSIS — M3501 Sicca syndrome with keratoconjunctivitis: Secondary | ICD-10-CM | POA: Diagnosis not present

## 2020-11-17 DIAGNOSIS — M2242 Chondromalacia patellae, left knee: Secondary | ICD-10-CM

## 2020-11-17 DIAGNOSIS — I10 Essential (primary) hypertension: Secondary | ICD-10-CM

## 2020-11-17 DIAGNOSIS — Z8719 Personal history of other diseases of the digestive system: Secondary | ICD-10-CM

## 2020-11-17 NOTE — Patient Instructions (Signed)
Neck Exercises Ask your health care provider which exercises are safe for you. Do exercises exactly as told by your health care provider and adjust them as directed. It is normal to feel mild stretching, pulling, tightness, or discomfort as you do these exercises. Stop right away if you feel sudden pain or your pain gets worse. Do not begin these exercises until told by your health care provider. Neck exercises can be important for many reasons. They can improve strength and maintain flexibility in your neck, which will help your upper back and prevent neck pain. Stretching exercises Rotation neck stretching  1. Sit in a chair or stand up. 2. Place your feet flat on the floor, shoulder width apart. 3. Slowly turn your head (rotate) to the right until a slight stretch is felt. Turn it all the way to the right so you can look over your right shoulder. Do not tilt or tip your head. 4. Hold this position for 10-30 seconds. 5. Slowly turn your head (rotate) to the left until a slight stretch is felt. Turn it all the way to the left so you can look over your left shoulder. Do not tilt or tip your head. 6. Hold this position for 10-30 seconds. Repeat __________ times. Complete this exercise __________ times a day. Neck retraction 1. Sit in a sturdy chair or stand up. 2. Look straight ahead. Do not bend your neck. 3. Use your fingers to push your chin backward (retraction). Do not bend your neck for this movement. Continue to face straight ahead. If you are doing the exercise properly, you will feel a slight sensation in your throat and a stretch at the back of your neck. 4. Hold the stretch for 1-2 seconds. Repeat __________ times. Complete this exercise __________ times a day. Strengthening exercises Neck press 1. Lie on your back on a firm bed or on the floor with a pillow under your head. 2. Use your neck muscles to push your head down on the pillow and straighten your spine. 3. Hold the position  as well as you can. Keep your head facing up (in a neutral position) and your chin tucked. 4. Slowly count to 5 while holding this position. Repeat __________ times. Complete this exercise __________ times a day. Isometrics These are exercises in which you strengthen the muscles in your neck while keeping your neck still (isometrics). 1. Sit in a supportive chair and place your hand on your forehead. 2. Keep your head and face facing straight ahead. Do not flex or extend your neck while doing isometrics. 3. Push forward with your head and neck while pushing back with your hand. Hold for 10 seconds. 4. Do the sequence again, this time putting your hand against the back of your head. Use your head and neck to push backward against the hand pressure. 5. Finally, do the same exercise on either side of your head, pushing sideways against the pressure of your hand. Repeat __________ times. Complete this exercise __________ times a day. Prone head lifts 1. Lie face-down (prone position), resting on your elbows so that your chest and upper back are raised. 2. Start with your head facing downward, near your chest. Position your chin either on or near your chest. 3. Slowly lift your head upward. Lift until you are looking straight ahead. Then continue lifting your head as far back as you can comfortably stretch. 4. Hold your head up for 5 seconds. Then slowly lower it to your starting position. Repeat __________   times. Complete this exercise __________ times a day. Supine head lifts 1. Lie on your back (supine position), bending your knees to point to the ceiling and keeping your feet flat on the floor. 2. Lift your head slowly off the floor, raising your chin toward your chest. 3. Hold for 5 seconds. Repeat __________ times. Complete this exercise __________ times a day. Scapular retraction 1. Stand with your arms at your sides. Look straight ahead. 2. Slowly pull both shoulders (scapulae) backward  and downward (retraction) until you feel a stretch between your shoulder blades in your upper back. 3. Hold for 10-30 seconds. 4. Relax and repeat. Repeat __________ times. Complete this exercise __________ times a day. Contact a health care provider if:  Your neck pain or discomfort gets much worse when you do an exercise.  Your neck pain or discomfort does not improve within 2 hours after you exercise. If you have any of these problems, stop exercising right away. Do not do the exercises again unless your health care provider says that you can. Get help right away if:  You develop sudden, severe neck pain. If this happens, stop exercising right away. Do not do the exercises again unless your health care provider says that you can. This information is not intended to replace advice given to you by your health care provider. Make sure you discuss any questions you have with your health care provider. Document Revised: 09/16/2018 Document Reviewed: 09/16/2018 Elsevier Patient Education  [place]. Core Strength Exercises  Core exercises help to build strength in the muscles between your ribs and your hips (abdominal muscles). These muscles help to support your body and keep your spine stable. It is important to maintain strength in your core to prevent injury and pain. Some activities, such as yoga and Pilates, can help to strengthen core muscles. You can also strengthen core muscles with exercises at home. It is important to talk to your health care provider before you start a new exercise routine. What are the benefits of core strength exercises? Core strength exercises can:  Reduce back pain.  Help to rebuild strength after a back or spine injury.  Help to prevent injury during physical activity, especially injuries to the back and knees. How to do core strength exercises Repeat these exercises 10-15 times, or until you are tired. Do exercises exactly as told by your health  care provider and adjust them as directed. It is normal to feel mild stretching, pulling, tightness, or discomfort as you do these exercises. If you feel any pain while doing these exercises, stop. If your pain continues or gets worse when doing core exercises, contact your health care provider. You may want to use a padded yoga or exercise mat for strength exercises that are done on the floor. Bridging  1. Lie on your back on a firm surface with your knees bent and your feet flat on the floor. 2. Raise your hips so that your knees, hips, and shoulders form a straight line together. Keep your abdominal muscles tight. 3. Hold this position for 3-5 seconds. 4. Slowly lower your hips to the starting position. 5. Let your muscles relax completely between repetitions. Single-leg bridge 1. Lie on your back on a firm surface with your knees bent and your feet flat on the floor. 2. Raise your hips so that your knees, hips, and shoulders form a straight line together. Keep your abdominal muscles tight. 3. Lift one foot off the floor, then completely straighten that  leg. 4. Hold this position for 3-5 seconds. 5. Put the straight leg back down in the bent position. 6. Slowly lower your hips to the starting position. 7. Repeat these steps using your other leg. Side bridge 1. Lie on your side with your knees bent. Prop yourself up on the elbow that is near the floor. 2. Using your abdominal muscles and your elbow that is on the floor, raise your body off the floor. Raise your hip so that your shoulder, hip, and foot form a straight line together. 3. Hold this position for 10 seconds. Keep your head and neck raised and away from your shoulder (in their normal, neutral position). Keep your abdominal muscles tight. 4. Slowly lower your hip to the starting position. 5. Repeat and try to hold this position longer, working your way up to 30 seconds. Abdominal crunch 1. Lie on your back on a firm surface. Bend  your knees and keep your feet flat on the floor. 2. Cross your arms over your chest. 3. Without bending your neck, tip your chin slightly toward your chest. 4. Tighten your abdominal muscles as you lift your chest just high enough to lift your shoulder blades off of the floor. Do not hold your breath. You can do this with short lifts or long lifts. 5. Slowly return to the starting position. Bird dog 1. Get on your hands and knees, with your legs shoulder-width apart and your arms under your shoulders. Keep your back straight. 2. Tighten your abdominal muscles. 3. Raise one of your legs off the floor and straighten it. Try to keep it parallel to the floor. 4. Slowly lower your leg to the starting position. 5. Raise one of your arms off the floor and straighten it. Try to keep it parallel to the floor. 6. Slowly lower your arm to the starting position. 7. Repeat with the other arm and leg. If possible, try raising a leg and arm at the same time, on opposite sides of the body. For example, raise your left hand and your right leg. Plank 1. Lie on your belly. 2. Prop up your body onto your forearms and your feet, keeping your legs straight. Your body should make a straight line between your shoulders and feet. 3. Hold this position for 10 seconds while keeping your abdominal muscles tight. 4. Lower your body to the starting position. 5. Repeat and try to hold this position longer, working your way up to 30 seconds. Cross-core strengthening 1. Stand with your feet shoulder-width apart. 2. Hold a ball out in front of you. Keep your arms straight. 3. Tighten your abdominal muscles and slowly rotate at your waist from side to side. Keep your feet flat. 4. Once you are comfortable, try repeating this exercise with a heavier ball. Top core strengthening 1. Stand about 18 inches (46 cm) in front of a wall, with your back to the wall. 2. Keep your feet flat and shoulder-width apart. 3. Tighten your  abdominal muscles. 4. Bend your hips and knees. 5. Slowly reach between your legs to touch the wall behind you. 6. Slowly stand back up. 7. Raise your arms over your head and reach behind you. 8. Return to the starting position. General tips  Do not do any exercises that cause pain. If you have pain while exercising, talk to your health care provider.  Always stretch before and after doing these exercises. This can help prevent injury.  Maintain a healthy weight. Ask your health care provider  what weight is healthy for you. Contact a health care provider if:  You have back pain that gets worse or does not go away.  You feel pain while doing core strength exercises. Get help right away if:  You have severe pain that does not get better with medicine. Summary  Core exercises help to build strength in the muscles between your ribs and your waist.  Core muscles help to support your body and keep your spine stable.  Some activities, such as yoga and Pilates, can help to strengthen core muscles.  Core strength exercises can help back pain and can prevent injury.  If you feel any pain while doing core strength exercises, stop. This information is not intended to replace advice given to you by your health care provider. Make sure you discuss any questions you have with your health care provider. Document Revised: 03/10/2019 Document Reviewed: 04/09/2017 Elsevier Patient Education  [place]. Back Exercises These exercises help to make your trunk and back strong. They also help to keep the lower back flexible. Doing these exercises can help to prevent back pain or lessen existing pain.  If you have back pain, try to do these exercises 2-3 times each day or as told by your doctor.  As you get better, do the exercises once each day. Repeat the exercises more often as told by your doctor.  To stop back pain from coming back, do the exercises once each day, or as told by your  doctor. Exercises Single knee to chest Do these steps 3-5 times in a row for each leg: 1. Lie on your back on a firm bed or the floor with your legs stretched out. 2. Bring one knee to your chest. 3. Grab your knee or thigh with both hands and hold them it in place. 4. Pull on your knee until you feel a gentle stretch in your lower back or buttocks. 5. Keep doing the stretch for 10-30 seconds. 6. Slowly let go of your leg and straighten it. Pelvic tilt Do these steps 5-10 times in a row: 1. Lie on your back on a firm bed or the floor with your legs stretched out. 2. Bend your knees so they point up to the ceiling. Your feet should be flat on the floor. 3. Tighten your lower belly (abdomen) muscles to press your lower back against the floor. This will make your tailbone point up to the ceiling instead of pointing down to your feet or the floor. 4. Stay in this position for 5-10 seconds while you gently tighten your muscles and breathe evenly. Cat-cow Do these steps until your lower back bends more easily: 1. Get on your hands and knees on a firm surface. Keep your hands under your shoulders, and keep your knees under your hips. You may put padding under your knees. 2. Let your head hang down toward your chest. Tighten (contract) the muscles in your belly. Point your tailbone toward the floor so your lower back becomes rounded like the back of a cat. 3. Stay in this position for 5 seconds. 4. Slowly lift your head. Let the muscles of your belly relax. Point your tailbone up toward the ceiling so your back forms a sagging arch like the back of a cow. 5. Stay in this position for 5 seconds.  Press-ups Do these steps 5-10 times in a row: 1. Lie on your belly (face-down) on the floor. 2. Place your hands near your head, about shoulder-width apart. 3.  While you keep your back relaxed and keep your hips on the floor, slowly straighten your arms to raise the top half of your body and lift your  shoulders. Do not use your back muscles. You may change where you place your hands in order to make yourself more comfortable. 4. Stay in this position for 5 seconds. 5. Slowly return to lying flat on the floor.  Bridges Do these steps 10 times in a row: 1. Lie on your back on a firm surface. 2. Bend your knees so they point up to the ceiling. Your feet should be flat on the floor. Your arms should be flat at your sides, next to your body. 3. Tighten your butt muscles and lift your butt off the floor until your waist is almost as high as your knees. If you do not feel the muscles working in your butt and the back of your thighs, slide your feet 1-2 inches farther away from your butt. 4. Stay in this position for 3-5 seconds. 5. Slowly lower your butt to the floor, and let your butt muscles relax. If this exercise is too easy, try doing it with your arms crossed over your chest. Belly crunches Do these steps 5-10 times in a row: 1. Lie on your back on a firm bed or the floor with your legs stretched out. 2. Bend your knees so they point up to the ceiling. Your feet should be flat on the floor. 3. Cross your arms over your chest. 4. Tip your chin a little bit toward your chest but do not bend your neck. 5. Tighten your belly muscles and slowly raise your chest just enough to lift your shoulder blades a tiny bit off of the floor. Avoid raising your body higher than that, because it can put too much stress on your low back. 6. Slowly lower your chest and your head to the floor. Back lifts Do these steps 5-10 times in a row: 1. Lie on your belly (face-down) with your arms at your sides, and rest your forehead on the floor. 2. Tighten the muscles in your legs and your butt. 3. Slowly lift your chest off of the floor while you keep your hips on the floor. Keep the back of your head in line with the curve in your back. Look at the floor while you do this. 4. Stay in this position for 3-5  seconds. 5. Slowly lower your chest and your face to the floor. Contact a doctor if:  Your back pain gets a lot worse when you do an exercise.  Your back pain does not get better 2 hours after you exercise. If you have any of these problems, stop doing the exercises. Do not do them again unless your doctor says it is okay. Get help right away if:  You have sudden, very bad back pain. If this happens, stop doing the exercises. Do not do them again unless your doctor says it is okay. This information is not intended to replace advice given to you by your health care provider. Make sure you discuss any questions you have with your health care provider. Document Revised: 08/13/2018 Document Reviewed: 08/13/2018 Elsevier Patient Education  2020 [employer].

## 2020-11-21 ENCOUNTER — Encounter (INDEPENDENT_AMBULATORY_CARE_PROVIDER_SITE_OTHER): Payer: Self-pay | Admitting: *Deleted

## 2021-01-18 ENCOUNTER — Other Ambulatory Visit: Payer: Self-pay | Admitting: Family Medicine

## 2021-01-18 DIAGNOSIS — E785 Hyperlipidemia, unspecified: Secondary | ICD-10-CM

## 2021-01-18 DIAGNOSIS — I10 Essential (primary) hypertension: Secondary | ICD-10-CM

## 2021-01-19 NOTE — Telephone Encounter (Signed)
May have 90-day Needs lipid, met 7 Needs office visit this spring

## 2021-03-02 ENCOUNTER — Other Ambulatory Visit: Payer: Self-pay

## 2021-03-02 ENCOUNTER — Ambulatory Visit: Payer: 59 | Admitting: Family Medicine

## 2021-03-02 ENCOUNTER — Encounter: Payer: Self-pay | Admitting: Family Medicine

## 2021-03-02 VITALS — BP 131/78 | HR 83 | Temp 98.0°F | Wt 179.0 lb

## 2021-03-02 DIAGNOSIS — R300 Dysuria: Secondary | ICD-10-CM | POA: Diagnosis not present

## 2021-03-02 DIAGNOSIS — E860 Dehydration: Secondary | ICD-10-CM | POA: Insufficient documentation

## 2021-03-02 LAB — POCT URINALYSIS DIPSTICK
Spec Grav, UA: >=1.03 — AB (ref 1.010–1.025)
pH, UA: 7 (ref 5.0–8.0)

## 2021-03-02 NOTE — Patient Instructions (Signed)
Increase water intake. Your specific gravity today suggests you are dehydrated.

## 2021-03-02 NOTE — Progress Notes (Signed)
Patient having low back pain for about one week. Dark urine for about 3 weeks. Pt states her bones are hurting and each time she gets an infection she feels run down.  (Pt has Sjogren and CMT)    Patient ID: Brittney [name], female    DOB: 1971/08/05, 50 y.o.   MRN: [id number]   Chief Complaint  Patient presents with  . Urinary Tract Infection   Subjective:  CC: UTI  This is a new problem.  Presents today for urinary tract infection.  Reports that she is having low back pain, dark urine, strong odor to her urine and her bones are hurting.  Denies frequency or urgency and burning.  No fever no chills no chest pain no shortness of breath.  Symptoms have been present for about 3 weeks.    Medical History Brittney [name] has a past medical history of Acid reflux, Chest pain, Diabetic mellitus, gestational, Hypertension, IBS (irritable bowel syndrome), IBS (irritable bowel syndrome), Obesity, and Seasonal allergies.   Outpatient Encounter Medications as of 03/02/2021  Medication Sig  . Ascorbic Acid (VITAMIN C PO) Place 2,000 mg under the tongue daily.   Marland Kitchen BIOTIN PO Take 1,000 mcg by mouth daily.   . Cholecalciferol (VITAMIN D) 125 MCG (5000 UT) CAPS Take 5,000 Units by mouth daily.   . Collagen 500 MG CAPS Take by mouth daily. Bone Broth  Scoop = 15 grams  . linaclotide (LINZESS) 145 MCG CAPS capsule Take 145 mcg by mouth daily before breakfast.  . losartan (COZAAR) 50 MG tablet TAKE 1 TABLET BY MOUTH EVERY DAY  . Maca Root POWD 5 mLs by Does not apply route daily.   Marland Kitchen MAGNESIUM PO Take 400 mg by mouth daily.   . Multiple Vitamins-Minerals (MULTIVITAMIN PO) Take 1 tablet by mouth daily. Alive for Woman 65 +  . Omega-3 Fatty Acids (FISH OIL PO) Take 400 mg by mouth daily.  . TURMERIC PO Take 375 mg by mouth daily.   Marland Kitchen UNABLE TO FIND Take 10 mg by mouth at bedtime. CBD gummie  . linaclotide (LINZESS) 145 MCG CAPS capsule Take 1 capsule (145 mcg total) by mouth daily.   No facility-administered  encounter medications on file as of 03/02/2021.     Review of Systems  Constitutional: Positive for chills. Negative for fever.  Respiratory: Negative for shortness of breath.   Cardiovascular: Negative for chest pain.  Genitourinary: Positive for decreased urine volume and flank pain. Negative for dysuria, frequency, hematuria, urgency, vaginal bleeding, vaginal discharge and vaginal pain.       Strong urine odor     Vitals BP 131/78   Pulse 83   Temp 98 F (36.7 C)   Wt 179 lb (81.2 kg)   SpO2 99%   BMI 36.15 kg/m   Objective:   Physical Exam Vitals reviewed.  Constitutional:      Appearance: Normal appearance.  Cardiovascular:     Rate and Rhythm: Normal rate and regular rhythm.     Heart sounds: Normal heart sounds.  Pulmonary:     Effort: Pulmonary effort is normal.     Breath sounds: Normal breath sounds.  Abdominal:     Tenderness: There is no right CVA tenderness or left CVA tenderness.  Skin:    General: Skin is warm and dry.  Neurological:     General: No focal deficit present.     Mental Status: She is alert.  Psychiatric:        Behavior: Behavior normal.  Results for orders placed or performed in visit on 03/02/21  POCT Urinalysis Dipstick  Result Value Ref Range   Color, UA     Clarity, UA     Glucose, UA     Bilirubin, UA     Ketones, UA     Spec Grav, UA >=1.030 (A) 1.010 - 1.025   Blood, UA     pH, UA 7.0 5.0 - 8.0   Protein, UA     Urobilinogen, UA     Nitrite, UA     Leukocytes, UA     Appearance     Odor      Assessment and Plan   1. Dysuria - POCT Urinalysis Dipstick - Urine Culture  2. Acute dehydration   Urine dip negative for infection, will send for culture.  Specific gravity high, indicating dehydration,  Heart rate within normal limits.   Instructed to drink more water, so that she is urinating at least every 1-2 hours.   Agrees with plan of care discussed today. Understands warning signs to seek further care:  chest pain, shortness of breath, any significant change in health.  Understands to follow-up if symptoms do not improve, or worsen.    Chalmers Guest, NP 03/02/2021

## 2021-03-04 LAB — URINE CULTURE

## 2021-05-23 ENCOUNTER — Ambulatory Visit: Payer: 59 | Admitting: Family Medicine

## 2021-05-23 ENCOUNTER — Encounter: Payer: Self-pay | Admitting: Family Medicine

## 2021-05-23 ENCOUNTER — Other Ambulatory Visit: Payer: Self-pay

## 2021-05-23 VITALS — BP 119/78 | HR 76 | Temp 98.4°F | Ht 59.0 in | Wt 177.0 lb

## 2021-05-23 DIAGNOSIS — G600 Hereditary motor and sensory neuropathy: Secondary | ICD-10-CM

## 2021-05-23 DIAGNOSIS — M47816 Spondylosis without myelopathy or radiculopathy, lumbar region: Secondary | ICD-10-CM | POA: Diagnosis not present

## 2021-05-23 DIAGNOSIS — Z13220 Encounter for screening for lipoid disorders: Secondary | ICD-10-CM | POA: Diagnosis not present

## 2021-05-23 DIAGNOSIS — E559 Vitamin D deficiency, unspecified: Secondary | ICD-10-CM

## 2021-05-23 MED ORDER — MELOXICAM 15 MG PO TABS: 15.0000 mg | ORAL_TABLET | Freq: Every day | ORAL | 1 refills | Status: DC

## 2021-05-23 MED ORDER — MECLIZINE HCL 25 MG PO TABS: 25.0000 mg | ORAL_TABLET | Freq: Three times a day (TID) | ORAL | 1 refills | Status: AC | PRN

## 2021-05-23 NOTE — Progress Notes (Signed)
   Subjective:    Patient ID: Brittney [name], female    DOB: 1971-04-29, 50 y.o.   MRN: [id number]  Back Pain This is a chronic problem. Episode onset: 6 months or more. The pain is present in the lumbar spine. Radiates to: right hip, pain in tailbone. The symptoms are aggravated by position, bending, sitting and standing. Treatments tried: tylenol, lying still.   Patient saw a rheumatologist Labs and rheumatology note and x-rays reviewed X-rays reviewed with the patient  Patient hasCMT she saw a specialist with [facility].  She states that they were not particularly helpful with further information.  We had further discussion patient interested with seeing a specialist at [facility]  Would like rx for meclizine for vertigo.   Traveling out of country and would like for flight.   Review of Systems  Musculoskeletal:  Positive for back pain.      Objective:   Physical Exam General-in no acute distress Eyes-no discharge Lungs-respiratory rate normal, CTA CV-no murmurs,RRR Extremities skin warm dry no edema Neuro grossly normal Behavior normal, alert Subjective lumbar pain       Assessment & Plan:  1. Lumbar arthropathy Will order lab work await till the results come back may end up needing MRI if not improving This is been going on for over 6 months Patient has tried conservative therapy.  They have also seen rheumatology. - HLA-B27 antigen - Lipid panel - Comprehensive metabolic panel - VITAMIN D 25 Hydroxy (Vit-D Deficiency, Fractures) - C-reactive protein  2. Vitamin D deficiency Continue current vitamin D check levels may need larger dosing - HLA-B27 antigen - Lipid panel - Comprehensive metabolic panel - VITAMIN D 25 Hydroxy (Vit-D Deficiency, Fractures) - C-reactive protein  3. Charcot-Marie-Tooth disease type 1 Will look into whether or not anti-inflammatories get along with her current disease  Also go ahead with referral to [facility] to see specialists who works with  CMT - HLA-B27 antigen - Lipid panel - Comprehensive metabolic panel - VITAMIN D 25 Hydroxy (Vit-D Deficiency, Fractures) - C-reactive protein  4. Screening for lipid disorders Screening - Lipid panel  Was given prescription for meclizine for motion sickness.  Patient states scopolamine made it worse.  Patient encouraged not to utilize alcohol with this medication caution drowsiness  I reviewed over the medications listed by the C[employer] Association regarding neurotoxicity.  I did not find anti-inflammatories on this.  We will try meloxicam to see if it helps.  We will follow-up patient within 4 to 6 weeks.

## 2021-06-08 ENCOUNTER — Ambulatory Visit: Payer: 59 | Admitting: Neurology

## 2021-06-20 ENCOUNTER — Telehealth: Payer: Self-pay | Admitting: Family Medicine

## 2021-06-20 DIAGNOSIS — Z79899 Other long term (current) drug therapy: Secondary | ICD-10-CM

## 2021-06-20 NOTE — Telephone Encounter (Signed)
Pt is Covid positive as of 06/20/2021 with a rapid test. Pt is experiencing Cough stuffy nose and congestion scratchy throat and  Nausea as well as Diarrhea Pt is wondering what she can take, or if Dr.Luking is prescribing the new medication for covid. Pt would like a call back

## 2021-06-20 NOTE — Telephone Encounter (Signed)
Please advise. Thank you

## 2021-06-21 NOTE — Telephone Encounter (Signed)
Could we order the blood work stat or would she need an appt? Please advise. Thank you

## 2021-06-21 NOTE — Telephone Encounter (Signed)
Pt contacted. Pt unable to go to hospital for STAT labs. Pt is working from home and work is really backed up. Pt states she will do symptomatic care at home.

## 2021-06-22 ENCOUNTER — Other Ambulatory Visit (HOSPITAL_COMMUNITY)
Admission: RE | Admit: 2021-06-22 | Discharge: 2021-06-22 | Disposition: A | Discharge status: Home / Self Care | Payer: 59 | Source: Ambulatory Visit | Attending: Family Medicine | Admitting: Family Medicine

## 2021-06-22 DIAGNOSIS — Z13220 Encounter for screening for lipoid disorders: Secondary | ICD-10-CM | POA: Insufficient documentation

## 2021-06-22 DIAGNOSIS — E559 Vitamin D deficiency, unspecified: Secondary | ICD-10-CM | POA: Insufficient documentation

## 2021-06-22 DIAGNOSIS — Z79899 Other long term (current) drug therapy: Secondary | ICD-10-CM | POA: Diagnosis not present

## 2021-06-22 DIAGNOSIS — G600 Hereditary motor and sensory neuropathy: Secondary | ICD-10-CM | POA: Insufficient documentation

## 2021-06-22 DIAGNOSIS — M47816 Spondylosis without myelopathy or radiculopathy, lumbar region: Secondary | ICD-10-CM | POA: Diagnosis present

## 2021-06-22 LAB — LIPID PANEL
Cholesterol: 165 mg/dL (ref 0–200)
HDL: 52 mg/dL (ref >40)
LDL Cholesterol: 99 mg/dL (ref 0–99)
Total CHOL/HDL Ratio: 3.2 RATIO
Triglycerides: 70 mg/dL (ref <150)
VLDL: 14 mg/dL (ref 0–40)

## 2021-06-22 LAB — COMPREHENSIVE METABOLIC PANEL
ALT: 18 U/L (ref 0–44)
AST: 21 U/L (ref 15–41)
Albumin: 3.7 g/dL (ref 3.5–5.0)
Alkaline Phosphatase: 31 U/L — ABNORMAL LOW (ref 38–126)
Anion gap: 8 (ref 5–15)
BUN: 10 mg/dL (ref 6–20)
CO2: 26 mmol/L (ref 22–32)
Calcium: 8.8 mg/dL — ABNORMAL LOW (ref 8.9–10.3)
Chloride: 100 mmol/L (ref 98–111)
Creatinine, Ser: 0.61 mg/dL (ref 0.44–1.00)
GFR, Estimated: >60 mL/min (ref >60)
Glucose, Bld: 114 mg/dL — ABNORMAL HIGH (ref 70–99)
Potassium: 3.3 mmol/L — ABNORMAL LOW (ref 3.5–5.1)
Sodium: 134 mmol/L — ABNORMAL LOW (ref 135–145)
Total Bilirubin: 0.4 mg/dL (ref 0.3–1.2)
Total Protein: 6.8 g/dL (ref 6.5–8.1)

## 2021-06-22 LAB — VITAMIN D 25 HYDROXY (VIT D DEFICIENCY, FRACTURES): Vit D, 25-Hydroxy: 36.16 ng/mL (ref 30–100)

## 2021-06-22 LAB — C-REACTIVE PROTEIN: CRP: 13.7 mg/dL — ABNORMAL HIGH (ref <1.0)

## 2021-06-22 MED ORDER — NIRMATRELVIR/RITONAVIR (PAXLOVID)TABLET: 3.0000 | ORAL_TABLET | Freq: Two times a day (BID) | ORAL | 0 refills | Status: AC

## 2021-06-22 NOTE — Telephone Encounter (Signed)
Pls call pt and let her know sent to her pharmacy, paxlovid. Thx, Dr. Lovena Le

## 2021-06-22 NOTE — Telephone Encounter (Signed)
Patient notified and verbalized understanding. 

## 2021-06-22 NOTE — Telephone Encounter (Signed)
Blood work ordered and order faxed to hospital. Patient notified and heading there now.

## 2021-06-22 NOTE — Telephone Encounter (Signed)
Patient would like to do the STAT labs as offered yesterday and have script of paxlovid called in

## 2021-06-22 NOTE — Telephone Encounter (Signed)
Ok yes, pls send in the stat bmp with gfr and then we can send in the paxlovid.   Thx.   Dr. Lovena Le

## 2021-06-22 NOTE — Telephone Encounter (Signed)
Pt is now wanting to get the labs done and what was a originally being done with her care

## 2021-06-26 ENCOUNTER — Other Ambulatory Visit: Payer: Self-pay | Admitting: Family Medicine

## 2021-06-26 MED ORDER — POTASSIUM CHLORIDE ER 10 MEQ PO TBCR: EXTENDED_RELEASE_TABLET | ORAL | 0 refills | Status: DC

## 2021-06-27 ENCOUNTER — Encounter: Payer: Self-pay | Admitting: Family Medicine

## 2021-06-27 NOTE — Telephone Encounter (Signed)
Nurses  Please connect with Nailyn-feel free to share message with her as well I certainly am concerned about the symptoms that she is putting forth.  What she is describing could be secondary pneumonia, but also COVID can increase her risk of blood clots.  This would need further evaluation through the emergency department including chest x-ray and blood work to rule out the possibility of pneumonia and blood clots.  It would be wise for her to proceed today to get this looked into. Please also clarify which emergency department she is going to and we can help notify triage that she is coming for this stated issue.  Thanks-Dr. Nicki Reaper

## 2021-06-27 NOTE — Telephone Encounter (Signed)
Pt contacted. Pt hesitant at first, wanted provider to write an order for an xray; informed pt of provider recommendations to go to ED to be evaluated. Pt verbalized understanding. Pt states she would be going to [facility]; contacted [facility] to inform them patient would be coming to them.

## 2021-06-28 LAB — HLA-B27 ANTIGEN: HLA-B27: NEGATIVE

## 2021-07-09 ENCOUNTER — Emergency Department (HOSPITAL_COMMUNITY)
Admission: EM | Admit: 2021-07-09 | Discharge: 2021-07-09 | Disposition: A | Discharge status: Home / Self Care | Payer: 59 | Attending: Emergency Medicine | Admitting: Emergency Medicine

## 2021-07-09 ENCOUNTER — Encounter (HOSPITAL_COMMUNITY): Payer: Self-pay | Admitting: *Deleted

## 2021-07-09 ENCOUNTER — Other Ambulatory Visit: Payer: Self-pay

## 2021-07-09 ENCOUNTER — Emergency Department (HOSPITAL_COMMUNITY): Payer: 59

## 2021-07-09 DIAGNOSIS — K5900 Constipation, unspecified: Secondary | ICD-10-CM

## 2021-07-09 DIAGNOSIS — R059 Cough, unspecified: Secondary | ICD-10-CM | POA: Diagnosis present

## 2021-07-09 DIAGNOSIS — Z79899 Other long term (current) drug therapy: Secondary | ICD-10-CM | POA: Diagnosis not present

## 2021-07-09 DIAGNOSIS — I10 Essential (primary) hypertension: Secondary | ICD-10-CM | POA: Diagnosis not present

## 2021-07-09 DIAGNOSIS — J069 Acute upper respiratory infection, unspecified: Secondary | ICD-10-CM | POA: Insufficient documentation

## 2021-07-09 DIAGNOSIS — E119 Type 2 diabetes mellitus without complications: Secondary | ICD-10-CM | POA: Insufficient documentation

## 2021-07-09 DIAGNOSIS — Z20822 Contact with and (suspected) exposure to covid-19: Secondary | ICD-10-CM | POA: Diagnosis not present

## 2021-07-09 DIAGNOSIS — H05232 Hemorrhage of left orbit: Secondary | ICD-10-CM | POA: Insufficient documentation

## 2021-07-09 DIAGNOSIS — Z8616 Personal history of COVID-19: Secondary | ICD-10-CM | POA: Insufficient documentation

## 2021-07-09 LAB — CBC WITH DIFFERENTIAL/PLATELET
Abs Immature Granulocytes: 0.04 10*3/uL (ref 0.00–0.07)
Basophils Absolute: 0.1 10*3/uL (ref 0.0–0.1)
Basophils Relative: 1 %
Eosinophils Absolute: 0 10*3/uL (ref 0.0–0.5)
Eosinophils Relative: 0 %
HCT: 37.8 % (ref 36.0–46.0)
Hemoglobin: 12.3 g/dL (ref 12.0–15.0)
Immature Granulocytes: 0 %
Lymphocytes Relative: 16 %
Lymphs Abs: 1.5 10*3/uL (ref 0.7–4.0)
MCH: 30.4 pg (ref 26.0–34.0)
MCHC: 32.5 g/dL (ref 30.0–36.0)
MCV: 93.6 fL (ref 80.0–100.0)
Monocytes Absolute: 0.6 10*3/uL (ref 0.1–1.0)
Monocytes Relative: 6 %
Neutro Abs: 7.5 10*3/uL (ref 1.7–7.7)
Neutrophils Relative %: 77 %
Platelets: 455 10*3/uL — ABNORMAL HIGH (ref 150–400)
RBC: 4.04 MIL/uL (ref 3.87–5.11)
RDW: 12.8 % (ref 11.5–15.5)
WBC: 9.7 10*3/uL (ref 4.0–10.5)
nRBC: 0 % (ref 0.0–0.2)

## 2021-07-09 LAB — URINALYSIS, ROUTINE W REFLEX MICROSCOPIC
Bilirubin Urine: NEGATIVE
Glucose, UA: NEGATIVE mg/dL
Ketones, ur: NEGATIVE mg/dL
Leukocytes,Ua: NEGATIVE
Nitrite: NEGATIVE
Protein, ur: NEGATIVE mg/dL
Specific Gravity, Urine: 1.003 — ABNORMAL LOW (ref 1.005–1.030)
pH: 7 (ref 5.0–8.0)

## 2021-07-09 LAB — COMPREHENSIVE METABOLIC PANEL
ALT: 21 U/L (ref 0–44)
AST: 20 U/L (ref 15–41)
Albumin: 3.9 g/dL (ref 3.5–5.0)
Alkaline Phosphatase: 38 U/L (ref 38–126)
Anion gap: 7 (ref 5–15)
BUN: 8 mg/dL (ref 6–20)
CO2: 29 mmol/L (ref 22–32)
Calcium: 9.2 mg/dL (ref 8.9–10.3)
Chloride: 97 mmol/L — ABNORMAL LOW (ref 98–111)
Creatinine, Ser: 0.64 mg/dL (ref 0.44–1.00)
GFR, Estimated: >60 mL/min (ref >60)
Glucose, Bld: 109 mg/dL — ABNORMAL HIGH (ref 70–99)
Potassium: 3.7 mmol/L (ref 3.5–5.1)
Sodium: 133 mmol/L — ABNORMAL LOW (ref 135–145)
Total Bilirubin: 0.7 mg/dL (ref 0.3–1.2)
Total Protein: 8.3 g/dL — ABNORMAL HIGH (ref 6.5–8.1)

## 2021-07-09 LAB — RESP PANEL BY RT-PCR (FLU A&B, COVID) ARPGX2
Influenza A by PCR: NEGATIVE
Influenza B by PCR: NEGATIVE
SARS Coronavirus 2 by RT PCR: NEGATIVE

## 2021-07-09 NOTE — Discharge Instructions (Addendum)
Recommend MiraLAX, Colace, glycerin suppositories for constipation.  Can also use Preparation H and Tucks pads.  Recheck with your primary care provider

## 2021-07-09 NOTE — ED Provider Notes (Signed)
[facility] EMERGENCY DEPARTMENT Provider Note   CSN: [id number] Arrival date & time: 07/09/21  1647     History Chief Complaint  Patient presents with   Fever    Brittney [name] is a 50 y.o. female.  50 year old female with past medical history of diabetes, sjogren's, as well as Charcot-Marie-Tooth.  Presents with, cough since COVID diagnosis 06/20/21 (took Paxlovid x 2 days and stopped due to side effects), now with return of fever with max temp today 101. Reports chills, body aches. No known sick contacts, no other complaints or concerns.       Past Medical History:  Diagnosis Date   Acid reflux    Chest pain    intermittent x 1 yr   Diabetic mellitus, gestational    Hypertension    during pregnancy. Not treated for it now.   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   History of gastroesophageal reflux (GERD) 11/18/2018   Sjogren's syndrome with keratoconjunctivitis sicca ([place]) 11/04/2018   Ulnar neuropathy of both upper extremities 11/04/2018   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   Essential hypertension, benign 07/12/2014   Paresthesia of hand, bilateral 02/03/2014   Generalized abdominal fullness 02/09/2013   Fatigue 01/03/2010   Morbid obesity ([place]) 06/22/2009   ALLERGIC RHINITIS, SEASONAL 06/22/2009   IBS (irritable bowel syndrome) 06/22/2009   CHEST PAIN 06/22/2009    Past Surgical History:  Procedure Laterality Date   CESAREAN SECTION W/BTL  2007   cholectystectomy  2000   CHOLECYSTECTOMY  2000    COLONOSCOPY N/A 02/19/2013   Procedure: COLONOSCOPY;  Surgeon: Rogene Houston, MD;  Location: [facility];  Service: Endoscopy;  Laterality: N/A;  830   COLONOSCOPY WITH PROPOFOL N/A 10/31/2020   Procedure: COLONOSCOPY WITH PROPOFOL;  Surgeon: Rogene Houston, MD;  Location: [facility];  Service: Gastroenterology;  Laterality: N/A;  1130   POLYPECTOMY  10/31/2020   Procedure: POLYPECTOMY;  Surgeon: Rogene Houston, MD;  Location: [facility];  Service: Gastroenterology;;     OB History   No obstetric history on file.     Family History  Problem Relation Age of Onset   Kidney failure Mother    Cancer Father 67       colon cancer   Healthy Sister    Healthy Brother    Cancer Brother    Healthy Sister    Eczema Daughter    Allergies Daughter    Eczema Son    Allergies Son     Social History   Tobacco Use   Smoking status: Never   Smokeless tobacco: Never  Vaping Use   Vaping Use: Never used  Substance Use Topics   Alcohol use: Yes    Comment: occassionally    Drug use: Never    Home Medications Prior to Admission medications   Medication Sig Start Date End Date Taking? Authorizing Provider  Ascorbic Acid (VITAMIN C PO) Place 2,000 mg under the tongue daily.     [provider]  BIOTIN PO Take 1,000 mcg by mouth daily.     [provider]  Cholecalciferol (VITAMIN D) 125 MCG (5000 UT) CAPS Take 5,000 Units by mouth daily.     [provider]  Collagen 500 MG CAPS Take by mouth daily. Bone Broth  Scoop = 15 grams    [provider]  linaclotide (LINZESS) 145 MCG CAPS capsule Take 1 capsule (145 mcg total) by mouth daily. 07/28/20 05/23/21  Harvel Quale, MD  linaclotide (LINZESS) 145 MCG CAPS capsule Take 145 mcg by mouth daily before breakfast.    [provider]  losartan (COZAAR) 50 MG tablet TAKE 1 TABLET BY MOUTH EVERY DAY 01/19/21   Kathyrn Drown, MD  MAGNESIUM PO Take 400 mg by mouth daily.      [provider]  meclizine (ANTIVERT) 25 MG tablet Take 1 tablet (25 mg total) by mouth 3 (three) times daily as needed for dizziness or nausea. 05/23/21   Kathyrn Drown, MD  meloxicam (MOBIC) 15 MG tablet Take 1 tablet (15 mg total) by mouth daily. 05/23/21   Kathyrn Drown, MD  Multiple Vitamins-Minerals (MULTIVITAMIN PO) Take 1 tablet by mouth daily. Alive for Woman 10 +    [provider]  Omega-3 Fatty Acids (FISH OIL PO) Take 400 mg by mouth daily.    [provider]  potassium chloride (KLOR-CON) 10 MEQ tablet Take one tablet po daily 06/26/21   Kathyrn Drown, MD  TURMERIC PO Take 375 mg by mouth daily.     [provider]  UNABLE TO FIND Take 10 mg by mouth at bedtime. CBD gummie    [provider]    Allergies    Miralax [polyethylene glycol] and Vasotec [enalapril]  Review of Systems   Review of Systems  Constitutional:  Positive for chills, diaphoresis and fever.  HENT:  Positive for congestion. Negative for sore throat.   Respiratory:  Positive for cough. Negative for shortness of breath.   Cardiovascular:  Negative for chest pain.  Gastrointestinal:  Positive for constipation. Negative for abdominal pain, diarrhea, nausea and vomiting.  Genitourinary:  Negative for dysuria.  Musculoskeletal:  Positive for arthralgias and myalgias.  Skin:  Negative for rash and wound.  Allergic/Immunologic: Positive for immunocompromised state.  Neurological:  Negative for weakness.  Hematological:  Negative for adenopathy.  Psychiatric/Behavioral:  Negative for confusion.   All other systems reviewed and are negative.  Physical Exam Updated Vital Signs BP (!) 149/90 (BP Location: Right Arm)   Pulse 76   Temp 99 F (37.2 C) (Oral)   Resp 18   Ht 4' 11.5" (1.511 m)   Wt 76.2 kg   SpO2 100%   BMI 33.36 kg/m   Physical Exam Vitals and nursing note reviewed.  Constitutional:      General: She is not in acute distress.    Appearance:  She is well-developed. She is not diaphoretic.  HENT:     Head: Normocephalic and atraumatic.     Right Ear: Tympanic membrane and ear canal normal.     Left Ear: Tympanic membrane and ear canal normal.     Nose: Nose normal.     Mouth/Throat:     Mouth: Mucous membranes are moist.  Eyes:     Conjunctiva/sclera:     Left eye: Hemorrhage present.  Cardiovascular:     Rate and Rhythm: Normal rate and regular rhythm.     Heart sounds: Normal heart sounds.  Pulmonary:  Effort: Pulmonary effort is normal.     Breath sounds: Normal breath sounds.  Abdominal:     Palpations: Abdomen is soft.     Tenderness: There is no abdominal tenderness.  Musculoskeletal:     Cervical back: Neck supple.     Right lower leg: No edema.     Left lower leg: No edema.  Lymphadenopathy:     Cervical: No cervical adenopathy.  Skin:    General: Skin is warm and dry.     Findings: No erythema or rash.  Neurological:     Mental Status: She is alert and oriented to person, place, and time.  Psychiatric:        Behavior: Behavior normal.    ED Results / Procedures / Treatments   Labs (all labs ordered are listed, but only abnormal results are displayed) Labs Reviewed  COMPREHENSIVE METABOLIC PANEL - Abnormal; Notable for the following components:      Result Value   Sodium 133 (*)    Chloride 97 (*)    Glucose, Bld 109 (*)    Total Protein 8.3 (*)    All other components within normal limits  CBC WITH DIFFERENTIAL/PLATELET - Abnormal; Notable for the following components:   Platelets 455 (*)    All other components within normal limits  URINALYSIS, ROUTINE W REFLEX MICROSCOPIC - Abnormal; Notable for the following components:   Color, Urine STRAW (*)    Specific Gravity, Urine 1.003 (*)    Hgb urine dipstick SMALL (*)    Bacteria, UA RARE (*)    All other components within normal limits  RESP PANEL BY RT-PCR (FLU A&B, COVID) [id number]    EKG None  Radiology DG Chest 2 View  Result  Date: 07/09/2021 CLINICAL DATA:  Fever and chills. Persistent cough. COVID diagnosis 06/20/2021 EXAM: CHEST - 2 VIEW COMPARISON:  Two-view chest x-ray 01/20/2017 FINDINGS: The heart size and mediastinal contours are within normal limits. Both lungs are clear. The visualized skeletal structures are unremarkable. IMPRESSION: Negative two view chest x-ray Electronically Signed   By: San Morelle M.D.   On: 07/09/2021 19:20    Procedures Procedures   Medications Ordered in ED Medications - No data to display  ED Course  I have reviewed the triage vital signs and the nursing notes.  Pertinent labs & imaging results that were available during my care of the patient were reviewed by me and considered in my medical decision making (see chart for details).  Clinical Course as of 07/09/21 2208  Mon Jul 10, 7055  3420 50 year old female with concern for fever with ongoing cough post COVID as above.  On exam, found to have a small subconjunctival hemorrhage of the left eye nasally.  Lungs are clear.  Arrival temp of 100.2 with O2 sat on room air 98%. Chest x-ray is unremarkable.  CBC with normal white blood cell count and differential.  CMP without significant findings, urinalysis negative for nitrites leukocytes and protein.  Patient is negative for COVID and flu today. Recommend recheck with primary care provider if fever persists.  No specific source found today other than persistent viral illness which has not become evident as pneumonia on exam or other bacterial illness. [LM]    Clinical Course User Index [LM] Roque Lias   MDM Rules/Calculators/A&P                           Final Clinical Impression(s) / ED Diagnoses  Final diagnoses:  Viral upper respiratory tract infection    Rx / DC Orders ED Discharge Orders     None        Roque Lias 07/09/21 2208    Dorie Rank, MD 07/10/21 [phone]

## 2021-07-09 NOTE — ED Triage Notes (Signed)
Pt states she tested positive for covid on 7/20 and today she is still having fever, chills, body aches; pt states she has been constipated and used an enema today with some results but still feels pressure to her rectum

## 2021-07-18 ENCOUNTER — Other Ambulatory Visit: Payer: Self-pay

## 2021-07-18 ENCOUNTER — Ambulatory Visit: Payer: 59 | Admitting: Family Medicine

## 2021-07-18 ENCOUNTER — Encounter: Payer: Self-pay | Admitting: Family Medicine

## 2021-07-18 ENCOUNTER — Other Ambulatory Visit: Payer: Self-pay | Admitting: Family Medicine

## 2021-07-18 VITALS — BP 132/86 | HR 90 | Ht 59.0 in | Wt 171.4 lb

## 2021-07-18 DIAGNOSIS — I10 Essential (primary) hypertension: Secondary | ICD-10-CM | POA: Diagnosis not present

## 2021-07-18 DIAGNOSIS — Z8616 Personal history of COVID-19: Secondary | ICD-10-CM

## 2021-07-18 DIAGNOSIS — R059 Cough, unspecified: Secondary | ICD-10-CM

## 2021-07-18 DIAGNOSIS — K625 Hemorrhage of anus and rectum: Secondary | ICD-10-CM

## 2021-07-18 DIAGNOSIS — E876 Hypokalemia: Secondary | ICD-10-CM

## 2021-07-18 MED ORDER — FLUTICASONE-SALMETEROL 100-50 MCG/ACT IN AEPB: 1.0000 | INHALATION_SPRAY | Freq: Two times a day (BID) | RESPIRATORY_TRACT | 2 refills | Status: DC

## 2021-07-18 NOTE — Progress Notes (Signed)
   Subjective:    Patient ID: Brittney [name], female    DOB: 1971-07-28, 50 y.o.   MRN: [id number]  HPI  Patient arrives for a follow up on inflammation in body.  Had recent lab work.  Was placed on potassium because of low potassium for 30 days  Patient also states she is still having problems with cough since recent Covid infection.  She relates intermittent coughing denies any tightness in the lungs but states the coughing is very frequent throughout the day giving her troubles.  Had constipation then had blood in stool Did colonoscopy last year she relates that she was having some constipation afterwards had some blood in the stool she thinks she has a hemorrhoid but she does not know for certain but she does have a family history of colon cancer Review of Systems     Objective:   Physical Exam  General-in no acute distress Eyes-no discharge Lungs-respiratory rate normal, CTA CV-no murmurs,RRR Extremities skin warm dry no edema Neuro grossly normal Behavior normal, alert       Assessment & Plan:   Patient was concerned about her CBC still elevated platelets when she was evaluated for COVID.Marland Kitchen  Patient was told that COVID can cause inflammation which can cause platelets to go up.  They should come back down I would recommend checking them again later this year if she is interested  Persistent cough is related to the COVID.  Her lungs sound clear recent chest x-ray looks good we did discuss just trying OTC measures versus inhalers.  Patient would like to try an inhaler I did recommend Advair over the next 30 days rinse after use 1 inhalation twice daily give [place] update within 2 weeks how that is doing  Blood pressure acceptable continue current measures patient to send [place] some blood pressure readings  Recent hypokalemia recheck lab work potentially will stop potassium and recheck it again down the road given that her lab work was soon after she was sick with COVID  Rectal  bleeding could have been due to constipation possibly due to hemorrhoids of referral back to GI because of family history of colon cancer  Recent labs reviewed with patient  No need for additional chest x-ray currently  Follow-up within 6 months for chronic health issues

## 2021-07-19 ENCOUNTER — Other Ambulatory Visit: Payer: Self-pay

## 2021-07-19 DIAGNOSIS — E876 Hypokalemia: Secondary | ICD-10-CM

## 2021-07-19 DIAGNOSIS — K5909 Other constipation: Secondary | ICD-10-CM

## 2021-07-19 LAB — BASIC METABOLIC PANEL
BUN/Creatinine Ratio: 13 (ref 9–23)
BUN: 8 mg/dL (ref 6–24)
CO2: 24 mmol/L (ref 20–29)
Calcium: 10.1 mg/dL (ref 8.7–10.2)
Chloride: 96 mmol/L (ref 96–106)
Creatinine, Ser: 0.63 mg/dL (ref 0.57–1.00)
Glucose: 85 mg/dL (ref 65–99)
Potassium: 4.4 mmol/L (ref 3.5–5.2)
Sodium: 136 mmol/L (ref 134–144)
eGFR: 107 mL/min/{1.73_m2} (ref >59)

## 2021-07-19 LAB — MAGNESIUM: Magnesium: 2.2 mg/dL (ref 1.6–2.3)

## 2021-07-19 NOTE — Progress Notes (Signed)
Patient has been informed per drs result notes and recommendations, she verbalized understanding.

## 2021-07-24 ENCOUNTER — Encounter (INDEPENDENT_AMBULATORY_CARE_PROVIDER_SITE_OTHER): Payer: Self-pay | Admitting: *Deleted

## 2021-08-12 ENCOUNTER — Encounter: Payer: Self-pay | Admitting: Family Medicine

## 2021-08-13 MED ORDER — LOSARTAN POTASSIUM 100 MG PO TABS: ORAL_TABLET | ORAL | 1 refills | Status: DC

## 2021-08-13 MED ORDER — AMLODIPINE BESYLATE 2.5 MG PO TABS: ORAL_TABLET | ORAL | 1 refills | Status: DC

## 2021-08-13 NOTE — Telephone Encounter (Signed)
Please make sure epic shows losartan 100 mg, 1 daily, #90, 1 refill  Also because blood pressure is elevated and she is already taking 100 mg losartan daily add amlodipine 2.5 mg 1 daily, #30 with 1 refill  Needs follow-up for blood pressure in approximately 2 to 3 weeks please  Notify us if any problems sooner

## 2021-08-13 NOTE — Telephone Encounter (Signed)
Brittney [name] For some reason patient's message states losartan 100 mg but her epic med list says losartan 50 mg  Obviously before we add any additional medicines we need to clarify this issue. May require calling pharmacy to verify which was prescribed most recently?  Once this is clarified then it can be sent back to me because we may need to add a low-dose medicine and do a follow-up visit with the patient in the near future thank you

## 2021-08-13 NOTE — Addendum Note (Signed)
Addended by: Vicente Males on: 08/13/2021 03:15 PM   Modules accepted: Orders

## 2021-08-17 ENCOUNTER — Other Ambulatory Visit: Payer: Self-pay | Admitting: Family Medicine

## 2021-08-23 ENCOUNTER — Telehealth: Payer: Self-pay | Admitting: Family Medicine

## 2021-08-23 NOTE — Telephone Encounter (Signed)
Please ascertain from the patient how the blood pressure is if it is unstable she will need to be seen next week if she feels it is emergent she would need urgent care or ER because I have nothing for tomorrow  If it is relatively in a reasonable range to slightly elevated somewhere within the next 3 weeks with myself either at 11:20 AM, 11:40 AM, 4:10 PM, 4:30 PM or what ever seems to work

## 2021-08-23 NOTE — Telephone Encounter (Signed)
Pt contacted. Pt was driving so was unable to give numbers. Advised pt to send my chart message with numbers or call in the morning with numbers. Pt verbalized understanding.  Pt states her blood pressure has been doing ok. "Its not 120/80 but its doing better"

## 2021-08-23 NOTE — Telephone Encounter (Signed)
Patient is wanting a follow up on her blood pressure . I tried to put with Hoyle Sauer because you have no opening until the end of October please advise

## 2021-08-24 ENCOUNTER — Encounter: Payer: Self-pay | Admitting: Family Medicine

## 2021-08-24 NOTE — Telephone Encounter (Signed)
Message placed in telephone message

## 2021-08-24 NOTE — Telephone Encounter (Signed)
Pt sent BP readings via MyChart: On 08/20/21 it ranged from 130/83 to 140/84   9/22 124/83 to 127/81   9/23  126/89   Thanks

## 2021-08-25 NOTE — Telephone Encounter (Signed)
Blood pressure readings are reasonable Continue current measures Follow-up in approximately 2 to 3 weeks Sooner if the patient is worried Do metabolic 7 before follow-up

## 2021-08-27 NOTE — Telephone Encounter (Signed)
Pt sent message via MyChart

## 2021-09-04 ENCOUNTER — Other Ambulatory Visit: Payer: Self-pay | Admitting: Family Medicine

## 2021-09-10 ENCOUNTER — Ambulatory Visit (INDEPENDENT_AMBULATORY_CARE_PROVIDER_SITE_OTHER): Payer: 59 | Admitting: Gastroenterology

## 2021-10-06 ENCOUNTER — Other Ambulatory Visit: Payer: Self-pay | Admitting: Family Medicine

## 2021-10-22 ENCOUNTER — Encounter: Payer: Self-pay | Admitting: Family Medicine

## 2021-10-22 DIAGNOSIS — I10 Essential (primary) hypertension: Secondary | ICD-10-CM

## 2021-10-22 NOTE — Telephone Encounter (Signed)
Metabolic 7 because of blood pressure medicine

## 2021-10-31 ENCOUNTER — Other Ambulatory Visit: Payer: Self-pay

## 2021-10-31 ENCOUNTER — Ambulatory Visit: Payer: 59 | Admitting: Family Medicine

## 2021-10-31 ENCOUNTER — Encounter: Payer: Self-pay | Admitting: Family Medicine

## 2021-10-31 VITALS — BP 134/78 | Temp 97.9°F | Wt 179.4 lb

## 2021-10-31 DIAGNOSIS — I10 Essential (primary) hypertension: Secondary | ICD-10-CM

## 2021-10-31 DIAGNOSIS — R002 Palpitations: Secondary | ICD-10-CM | POA: Diagnosis not present

## 2021-10-31 LAB — BASIC METABOLIC PANEL
BUN/Creatinine Ratio: 11 (ref 9–23)
BUN: 8 mg/dL (ref 6–24)
CO2: 26 mmol/L (ref 20–29)
Calcium: 9.8 mg/dL (ref 8.7–10.2)
Chloride: 99 mmol/L (ref 96–106)
Creatinine, Ser: 0.7 mg/dL (ref 0.57–1.00)
Glucose: 94 mg/dL (ref 70–99)
Potassium: 4.4 mmol/L (ref 3.5–5.2)
Sodium: 139 mmol/L (ref 134–144)
eGFR: 105 mL/min/{1.73_m2} (ref >59)

## 2021-10-31 MED ORDER — LOSARTAN POTASSIUM 100 MG PO TABS: ORAL_TABLET | ORAL | 1 refills | Status: DC

## 2021-10-31 MED ORDER — AMLODIPINE BESYLATE 2.5 MG PO TABS: ORAL_TABLET | ORAL | 1 refills | Status: DC

## 2021-10-31 NOTE — Patient Instructions (Signed)

## 2021-10-31 NOTE — Progress Notes (Signed)
   Subjective:    Patient ID: Brittney [name], female    DOB: [birth year], 50 y.o.   MRN: [id number]  HPI Pt here for follow up on blood pressure. Pt has neuromuscular and immune disorder and depending on how she eats, she becomes inflamed all over. Pt states not checking blood pressure at home but went to GYN yesterday and BP was elevated. Pt states she had an episode of "fluttering in center of chest and felt dizzy" yesterday. Pt is taking Amlodipine and Losartan daily at night.   Patient states energy level though no overall okay denies chest pressure tightness pain Has had some spells where her heart seems to flutter 1 time it happened during the day made her feel little bit dizzy a couple times it seemed to run fast at night I did talk with her about checking her pulse  She is working hard on diet exercise and trying to bring her weight down Review of Systems     Objective:   Physical Exam  General-in no acute distress Eyes-no discharge Lungs-respiratory rate normal, CTA CV-no murmurs,RRR Extremities skin warm dry no edema Neuro grossly normal Behavior normal, alert       Assessment & Plan:  HTN-continue medication, watch diet exercise try to bring weight down  Morbid obesity work hard on diet exercise bringing weight down  Intermittent palpitations and flutters none apparent on today's exam if has recurrence of this on a regular basis will recommend  ZIO patch monitor or perhaps cardiology consultation  Patient states she will be seeing rheumatology she will see if they will be doing any blood work to look at inflammation markers such as sed rate and CRP she will follow-up here in 4 months

## 2021-11-02 NOTE — Progress Notes (Signed)
Office Visit Note  Patient: Brittney [name]             Date of Birth: Nov 22, 1971           MRN: [id number]             PCP: Kathyrn Drown, MD Referring: Kathyrn Drown, MD Visit Date: 11/16/2021 Occupation: _0 @  Subjective:  Increased joint pain   History of Present Illness: Brittney [name] is a 50 y.o. female with history of sjogren's syndrome and osteoarthritis.  She is not taking any immunosuppressive agents.  She declined plaquenil in the past.  Patient reports that since her last office visit in December 2021 she has had increased joint pain involving multiple joints especially both hands, both knees, and her lower back.  Her joint stiffness has started to last all day.  She has been trying to remain active exercising on a regular basis.  She has difficulty gripping hand weights due to the discomfort and stiffness in her hands.  She denies any obvious joint swelling.  She is also had some increased discomfort in both hip joints radiating into the groin.  She has been alternating Tylenol and ibuprofen for pain relief which she occasionally has to take on a daily basis but sometimes can go several days without taking over-the-counter products.  She remains on turmeric and fish oil daily.  She is also been taking collagen over-the-counter.  She has ongoing mouth and nose dryness which has been worse during the wintertime.  She has not noticed any increased eye dryness.  She continues to see her ophthalmologist and dentist on a regular basis as advised.  She denies any swollen lymph nodes.  She continues to have fatigue on a daily basis which has been worse after being diagnosed with COVID in August 2022.  Activities of Daily Living:  Patient reports morning stiffness for all day. Patient Reports nocturnal pain.  Difficulty dressing/grooming: Denies Difficulty climbing stairs: Denies Difficulty getting out of chair: Denies Difficulty using hands for taps, buttons, cutlery, and/or writing:  Reports  Review of Systems  Constitutional:  Positive for fatigue.  HENT:  Positive for mouth dryness and nose dryness. Negative for mouth sores.   Eyes:  Negative for pain, itching, visual disturbance and dryness.  Respiratory:  Negative for cough, hemoptysis and difficulty breathing.   Cardiovascular:  Positive for palpitations. Negative for hypertension and swelling in legs/feet.  Gastrointestinal:  Negative for blood in stool, constipation and diarrhea.  Endocrine: Negative for increased urination.  Genitourinary:  Negative for difficulty urinating and painful urination.  Musculoskeletal:  Positive for joint pain, joint pain, joint swelling, myalgias, morning stiffness, muscle tenderness and myalgias. Negative for muscle weakness.  Skin:  Negative for color change, pallor, rash, hair loss, nodules/bumps, redness, skin tightness, ulcers and sensitivity to sunlight.  Allergic/Immunologic: Negative for susceptible to infections.  Neurological:  Positive for dizziness, numbness, headaches and weakness. Negative for memory loss.  Hematological:  Positive for bruising/bleeding tendency. Negative for swollen glands.  Psychiatric/Behavioral:  Negative for depressed mood, confusion and sleep disturbance. The patient is not nervous/anxious.    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 Not treated for it now.   IBS (irritable bowel syndrome)    dx in 2005 by primary MD   IBS (irritable bowel syndrome)    Obesity    Seasonal allergies    yr roud; was immunothrapy in childhood, does not need any more at this time    Family History  Problem Relation Age of Onset   Kidney failure Mother    Cancer Father 87       colon cancer   Healthy Sister    Healthy Brother    Cancer Brother    Healthy Sister    Eczema Daughter    Allergies Daughter    Eczema Son    Allergies Son    Past Surgical History:  Procedure Laterality Date   CESAREAN SECTION W/BTL  2007   cholectystectomy  2000   CHOLECYSTECTOMY  2000   COLONOSCOPY N/A 02/19/2013   Procedure: COLONOSCOPY;  Surgeon: Rogene Houston, MD;  [facility]: [facility];  Service: Endoscopy;  Laterality: N/A;  830   COLONOSCOPY WITH PROPOFOL N/A 10/31/2020   Procedure: COLONOSCOPY WITH PROPOFOL;  Surgeon: Rogene Houston, MD;  Location: [facility];  Service: Gastroenterology;  Laterality: N/A;  1130   POLYPECTOMY  10/31/2020   Procedure: POLYPECTOMY;  Surgeon: Rogene Houston, MD;  Location: [facility];  Service: Gastroenterology;;   Social History   Social History Narrative   Married 14 years, 2 children ages 74 and 81.    Job: Pt accounting    Gets regular exercise.    Right Handed   Drinks Caffeine   Immunization History  Administered Date(s) Administered   Influenza-Unspecified 09/17/2017, 10/26/2019   Moderna Sars-Covid-2 Vaccination 01/26/2020, 02/23/2020     Objective: Vital Signs: BP 130/84 (BP Location: Left Arm, Patient Position: Sitting, Cuff Size: Large)    Pulse 71   Ht _0  (1.499 m)   Wt 179 lb 6.4 oz (81.4 kg)   BMI 36.23 kg/m    Physical Exam Vitals and nursing note reviewed.  Constitutional:      Appearance: She is well-developed.  HENT:     Head: Normocephalic and atraumatic.  Eyes:     Conjunctiva/sclera: Conjunctivae normal.  Pulmonary:     Effort: Pulmonary effort is normal.  Abdominal:     Palpations: Abdomen is soft.  Musculoskeletal:     Cervical back: Normal range of motion.  Skin:    General: Skin is warm and dry.     Capillary Refill: Capillary refill takes less than 2 seconds.  Neurological:     Mental Status: She is alert and oriented to person, place, and time.  Psychiatric:        Behavior: Behavior normal.     Musculoskeletal Exam: C-spine has good range of motion with no discomfort.  No midline spinal tenderness was noted.  No SI joint tenderness noted.  Shoulder joints, elbow joints, wrist joints, MCPs,  PIPs, DIPs have good range of motion with no synovitis.  She was able to make a complete fist bilaterally.  Hip joints have good range of motion with discomfort in the groin bilaterally.  Some tenderness over trochanteric bursa bilaterally.  Knee joints have good range of motion with no warmth or effusion.  Ankle joints have good range of motion with no tenderness or joint swelling.  No tenderness over MTP joints.  No evidence of achilles tendonitis or plantar fasciitis.   CDAI Exam: CDAI Score: -- Patient Global: --; Provider Global: -- Swollen: --; Tender: -- Joint Exam 11/16/2021   No joint exam has been documented for this visit   There is currently no information documented on the homunculus. Go to the Rheumatology activity and complete the homunculus joint exam.  Investigation: No additional findings.  Imaging: No results found.  Recent Labs: Lab Results  Component Value Date   WBC 9.7 07/09/2021   HGB 12.3 07/09/2021   PLT 455 (H) 07/09/2021   NA 139 10/29/2021   K 4.4 10/29/2021    CL 99 10/29/2021   CO2 26 10/29/2021   GLUCOSE 94 10/29/2021   BUN 8 10/29/2021   CREATININE 0.70 10/29/2021   BILITOT 0.7 07/09/2021   ALKPHOS 38 07/09/2021   AST 20 07/09/2021   ALT 21 07/09/2021   PROT 8.3 (H) 07/09/2021   ALBUMIN 3.9 07/09/2021   CALCIUM 9.8 10/29/2021   GFRAA 120 07/05/2020    Speciality Comments: No specialty comments available.  Procedures:  No procedures performed Allergies: Miralax [polyethylene glycol] and Vasotec [enalapril]   Assessment / Plan:     Visit Diagnoses: Sjogren's syndrome with keratoconjunctivitis sicca ([facility]) - ANA 1: 160 nuclear and centromere pattern, positive anti-Ro antibody.  Rest of the ENA negative, chronic mild sicca symptoms:   The patient presents today for a routine yearly follow-up visit.  She has been experiencing increased joint pain involving multiple joints most severe in both hands, both hips, and both knee joints.  She has had increased stiffness in both hands making it difficult to lift hand weights and perform ADLs at times.  Her joint stiffness has been lasting all day.  She has not noticed any joint swelling.  On examination today no synovitis or joint effusions were noted.  X-rays of both hands, both knees, and lumbar spine were updated today.  ESR and CRP will also be updated today.  She was given a list of natural anti-inflammatories so she plans on adding in turmeric and ginger as discussed today in detail.  Discussed the association of inflammatory arthritis with Sjogren's syndrome.  If her symptoms persist or worsen we will schedule an ultrasound of both hands to assess for synovitis.  She has declined the use of Plaquenil in the past in 2020 and 2021.  She is hesitant to take an immunosuppressive agent. She has ongoing mouth dryness and nose dryness which has worsened since having to turn the heat on during the wintertime.  Discussed the use of a humidifier in her home seasonally.  Also discussed the use of over-the-counter  products.  She has not had any eye dryness.  She continues to see the ophthalmologist and dentist on a regular basis as recommended.  No recent dental caries.  No cervical lymphadenopathy.  She was given a handout of information from the Sjogren[employer] about Sjogren's syndrome to review. Discussed the associated with Sjogren's syndrome and lymphoma.  CBC, sed rate, RF, and SPEP were ordered today. She recently had  2 episodes of palpitations and shortness of breath and was evaluated by her PCP.  At her appointment on 10/31/2021 they discussed proceeding with a Zio patch monitor in the future if she has any recurrence of symptoms.  Discussed that if she has persistent shortness of breath I would recommend proceeding with a high-resolution chest CT to rule out ILD.  If she develops any new or worsening symptoms we can also place a referral to cardiology and pulmonology for further evaluation. The following lab work will be updated today.  She was advised to notify us if she develops any new or worsening symptoms.  We discussed the importance of more frequent office visits to monitor her symptoms closely.  She will follow-up in the office in 4 months or sooner if necessary.  We will call her with x-ray results and lab results and further discuss the neck steps at that time.  - Plan: Sedimentation rate, Rheumatoid factor, Sjogrens syndrome-A extractable nuclear antibody, Sjogrens syndrome-B extractable nuclear antibody, ANA, Serum protein electrophoresis with reflex, C-reactive protein, CBC with Differential/Platelet  High risk medication use - Declined Plaquenil in August 2020 and at the office visit on 11/17/20.  Primary osteoarthritis of both hands: She presents today with increased pain and stiffness in both hands.  She has been experiencing increased arthralgias in both hands over the past year.  At times she has difficulty gripping hand weights due to severity of pain and stiffness.  She has not  noticed any joint swelling.  On examination today no synovitis was noted.  X-rays of both hands updated today.  We will call her with the results.  Discussed the importance of joint protection and muscle strengthening.  She is given a handout of hand exercises to perform.  Also discussed the importance of taking natural anti-inflammatories.  She plans on adding tart cherry and ginger to her daily regimen.  She was advised to notify us if her discomfort persists or worsens at which time we can schedule an ultrasound of both hands to assess for synovitis.  Pain in both hands - She has been experiencing increased pain and stiffness in both hands.  On examination today no obvious synovitis was noted.  She was able to make a complete fist bilaterally.  X-rays of both hands were updated today.  If her symptoms persist or worsen we can schedule an ultrasound of both hands to assess for synovitis.  ESR and CRP were updated today.  She was also given a handout of hand exercises to perform as well as a dry gripper and a list of natural anti-inflammatories.  Plan: Sedimentation rate, Rheumatoid factor, XR Hand 2 View Right, XR Hand 2 View Left  Chondromalacia patellae, right knee: She has good range of motion of the right knee joint.  No warmth or effusion was noted.  X-rays of the right knee were updated today.  Discussed the importance of lower extremity muscle strengthening.  She was given a handout of knee joint exercises to perform.  Chondromalacia, patella, left: She has good range of motion of the left knee joint on examination.  No warmth or effusion was noted.  X-rays of the left knee updated today.  She was given a handout of knee joint exercises to perform.  She was advised to notify us if her symptoms persist or worsen.  Chronic pain of both knees -She has been experiencing increased discomfort in both knee joints over the past several months.  No warmth or effusion was noted.  No mechanical symptoms.  No  difficulty climbing steps or rising from a seated position.  X-rays of both knees were updated today for further evaluation.  She plans on adding turmeric and ginger to her daily regimen.  Discussed the importance of lower extremity muscle strengthening.  Plan: XR KNEE 3 VIEW RIGHT, XR KNEE 3 VIEW LEFT  Neck pain: X-rays of the C-spine were updated on 11/17/2020 which were consistent with facet joint arthropathy.  She is not experiencing any increased neck pain or stiffness at this time.  She has good ROM of the C-spine on exam.  No symptoms of radiculopathy.   Chronic midline low back pain without sciatica -HLA-B27 antigen negative on 06/22/2021: She presents today with increased discomfort in her lower back.  Her lower back pain has been chronic but continues to worsen.  She is not experiencing any symptoms of radiculopathy currently.  No lower extremity muscle weakness.  No midline spinal tenderness.  X-rays of the lumbar spine were updated today.  X-rays from 11/17/2020 were consistent with facet joint arthropathy of the lumbar spine.  Discussed the importance of core strengthening and lower back exercises.  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   Follow-Up Instructions: Return in about 4 months (around 03/17/2022) for Sjogren's syndrome, Osteoarthritis.   Ofilia Neas, PA-C  Note - This record has been created using Dragon software.  Chart creation errors have been sought, but may not always  have been located. Such creation errors do not reflect on  the standard of medical care.

## 2021-11-16 ENCOUNTER — Encounter: Payer: Self-pay | Admitting: Physician Assistant

## 2021-11-16 ENCOUNTER — Other Ambulatory Visit: Payer: Self-pay

## 2021-11-16 ENCOUNTER — Ambulatory Visit: Payer: Self-pay

## 2021-11-16 ENCOUNTER — Ambulatory Visit: Payer: 59 | Admitting: Physician Assistant

## 2021-11-16 VITALS — BP 130/84 | HR 71 | Ht 59.0 in | Wt 179.4 lb

## 2021-11-16 DIAGNOSIS — G8929 Other chronic pain: Secondary | ICD-10-CM

## 2021-11-16 DIAGNOSIS — M545 Low back pain, unspecified: Secondary | ICD-10-CM

## 2021-11-16 DIAGNOSIS — Z8719 Personal history of other diseases of the digestive system: Secondary | ICD-10-CM

## 2021-11-16 DIAGNOSIS — M25562 Pain in left knee: Secondary | ICD-10-CM | POA: Diagnosis not present

## 2021-11-16 DIAGNOSIS — M542 Cervicalgia: Secondary | ICD-10-CM

## 2021-11-16 DIAGNOSIS — I10 Essential (primary) hypertension: Secondary | ICD-10-CM

## 2021-11-16 DIAGNOSIS — M25561 Pain in right knee: Secondary | ICD-10-CM

## 2021-11-16 DIAGNOSIS — M19042 Primary osteoarthritis, left hand: Secondary | ICD-10-CM

## 2021-11-16 DIAGNOSIS — G600 Hereditary motor and sensory neuropathy: Secondary | ICD-10-CM

## 2021-11-16 DIAGNOSIS — M79642 Pain in left hand: Secondary | ICD-10-CM

## 2021-11-16 DIAGNOSIS — M2241 Chondromalacia patellae, right knee: Secondary | ICD-10-CM | POA: Diagnosis not present

## 2021-11-16 DIAGNOSIS — M79641 Pain in right hand: Secondary | ICD-10-CM | POA: Diagnosis not present

## 2021-11-16 DIAGNOSIS — Z79899 Other long term (current) drug therapy: Secondary | ICD-10-CM | POA: Diagnosis not present

## 2021-11-16 DIAGNOSIS — M3501 Sicca syndrome with keratoconjunctivitis: Secondary | ICD-10-CM

## 2021-11-16 DIAGNOSIS — M2550 Pain in unspecified joint: Secondary | ICD-10-CM

## 2021-11-16 DIAGNOSIS — M2242 Chondromalacia patellae, left knee: Secondary | ICD-10-CM

## 2021-11-16 DIAGNOSIS — M19041 Primary osteoarthritis, right hand: Secondary | ICD-10-CM | POA: Diagnosis not present

## 2021-11-16 DIAGNOSIS — G5623 Lesion of ulnar nerve, bilateral upper limbs: Secondary | ICD-10-CM

## 2021-11-16 NOTE — Patient Instructions (Signed)
Hand Exercises Hand exercises can be helpful for almost anyone. These exercises can strengthen the hands, improve flexibility and movement, and increase blood flow to the hands. These results can make work and daily tasks easier. Hand exercises can be especially helpful for people who have joint pain from arthritis or have nerve damage from overuse (carpal tunnel syndrome). These exercises can also help people who have injured a hand. Exercises Most of these hand exercises are gentle stretching and motion exercises. It is usually safe to do them often throughout the day. Warming up your hands before exercise may help to reduce stiffness. You can do this with gentle massage or by placing your hands in warm water for 10-15 minutes. It is normal to feel some stretching, pulling, tightness, or mild discomfort as you begin new exercises. This will gradually improve. Stop an exercise right away if you feel sudden, severe pain or your pain gets worse. Ask your health care provider which exercises are best for you. Knuckle bend or "claw" fist  Stand or sit with your arm, hand, and all five fingers pointed straight up. Make sure to keep your wrist straight during the exercise. Gently bend your fingers down toward your palm until the tips of your fingers are touching the top of your palm. Keep your big knuckle straight and just bend the small knuckles in your fingers. Hold this position for __________ seconds. Straighten (extend) your fingers back to the starting position. Repeat this exercise 5-10 times with each hand. Full finger fist  Stand or sit with your arm, hand, and all five fingers pointed straight up. Make sure to keep your wrist straight during the exercise. Gently bend your fingers into your palm until the tips of your fingers are touching the middle of your palm. Hold this position for __________ seconds. Extend your fingers back to the starting position, stretching every joint fully. Repeat  this exercise 5-10 times with each hand. Straight fist Stand or sit with your arm, hand, and all five fingers pointed straight up. Make sure to keep your wrist straight during the exercise. Gently bend your fingers at the big knuckle, where your fingers meet your hand, and the middle knuckle. Keep the knuckle at the tips of your fingers straight and try to touch the bottom of your palm. Hold this position for __________ seconds. Extend your fingers back to the starting position, stretching every joint fully. Repeat this exercise 5-10 times with each hand. Tabletop  Stand or sit with your arm, hand, and all five fingers pointed straight up. Make sure to keep your wrist straight during the exercise. Gently bend your fingers at the big knuckle, where your fingers meet your hand, as far down as you can while keeping the small knuckles in your fingers straight. Think of forming a tabletop with your fingers. Hold this position for __________ seconds. Extend your fingers back to the starting position, stretching every joint fully. Repeat this exercise 5-10 times with each hand. Finger spread  Place your hand flat on a table with your palm facing down. Make sure your wrist stays straight as you do this exercise. Spread your fingers and thumb apart from each other as far as you can until you feel a gentle stretch. Hold this position for __________ seconds. Bring your fingers and thumb tight together again. Hold this position for __________ seconds. Repeat this exercise 5-10 times with each hand. Making circles  Stand or sit with your arm, hand, and all five fingers pointed  straight up. Make sure to keep your wrist straight during the exercise. Make a circle by touching the tip of your thumb to the tip of your index finger. Hold for __________ seconds. Then open your hand wide. Repeat this motion with your thumb and each finger on your hand. Repeat this exercise 5-10 times with each hand. Thumb  motion  Sit with your forearm resting on a table and your wrist straight. Your thumb should be facing up toward the ceiling. Keep your fingers relaxed as you move your thumb. Lift your thumb up as high as you can toward the ceiling. Hold for __________ seconds. Bend your thumb across your palm as far as you can, reaching the tip of your thumb for the small finger (pinkie) side of your palm. Hold for __________ seconds. Repeat this exercise 5-10 times with each hand. Grip strengthening  Hold a stress ball or other soft ball in the middle of your hand. Slowly increase the pressure, squeezing the ball as much as you can without causing pain. Think of bringing the tips of your fingers into the middle of your palm. All of your finger joints should bend when doing this exercise. Hold your squeeze for __________ seconds, then relax. Repeat this exercise 5-10 times with each hand. Contact a health care provider if: Your hand pain or discomfort gets much worse when you do an exercise. Your hand pain or discomfort does not improve within 2 hours after you exercise. If you have any of these problems, stop doing these exercises right away. Do not do them again unless your health care provider says that you can. Get help right away if: You develop sudden, severe hand pain or swelling. If this happens, stop doing these exercises right away. Do not do them again unless your health care provider says that you can. This information is not intended to replace advice given to you by your health care provider. Make sure you discuss any questions you have with your health care provider. Document Revised: 03/08/2021 Document Reviewed: 03/08/2021 Elsevier Patient Education  [place].  Knee Exercises Ask your health care provider which exercises are safe for you. Do exercises exactly as told by your health care provider and adjust them as directed. It is normal to feel mild stretching, pulling, tightness,  or discomfort as you do these exercises. Stop right away if you feel sudden pain or your pain gets worse. Do not begin these exercises until told by your health care provider. Stretching and range-of-motion exercises These exercises warm up your muscles and joints and improve the movement and flexibility of your knee. These exercises also help to relieve pain and swelling. Knee extension, prone  Lie on your abdomen (prone position) on a bed. Place your left / right knee just beyond the edge of the surface so your knee is not on the bed. You can put a towel under your left / right thigh just above your kneecap for comfort. Relax your leg muscles and allow gravity to straighten your knee (extension). You should feel a stretch behind your left / right knee. Hold this position for __________ seconds. Scoot up so your knee is supported between repetitions. Repeat __________ times. Complete this exercise __________ times a day. Knee flexion, active  Lie on your back with both legs straight. If this causes back discomfort, bend your left / right knee so your foot is flat on the floor. Slowly slide your left / right heel back toward your buttocks. Stop  when you feel a gentle stretch in the front of your knee or thigh (flexion). Hold this position for __________ seconds. Slowly slide your left / right heel back to the starting position. Repeat __________ times. Complete this exercise __________ times a day. Quadriceps stretch, prone  Lie on your abdomen on a firm surface, such as a bed or padded floor. Bend your left / right knee and hold your ankle. If you cannot reach your ankle or pant leg, loop a belt around your foot and grab the belt instead. Gently pull your heel toward your buttocks. Your knee should not slide out to the side. You should feel a stretch in the front of your thigh and knee (quadriceps). Hold this position for __________ seconds. Repeat __________ times. Complete this exercise  __________ times a day. Hamstring, supine  Lie on your back (supine position). Loop a belt or towel over the ball of your left / right foot. The ball of your foot is on the walking surface, right under your toes. Straighten your left / right knee and slowly pull on the belt to raise your leg until you feel a gentle stretch behind your knee (hamstring). Do not let your knee bend while you do this. Keep your other leg flat on the floor. Hold this position for __________ seconds. Repeat __________ times. Complete this exercise __________ times a day. Strengthening exercises These exercises build strength and endurance in your knee. Endurance is the ability to use your muscles for a long time, even after they get tired. Quadriceps, isometric This exercise strengthens the muscles in front of your thigh (quadriceps) without moving your knee joint (isometric). Lie on your back with your left / right leg extended and your other knee bent. Put a rolled towel or small pillow under your knee if told by your health care provider. Slowly tense the muscles in the front of your left / right thigh. You should see your kneecap slide up toward your hip or see increased dimpling just above the knee. This motion will push the back of the knee toward the floor. For __________ seconds, hold the muscle as tight as you can without increasing your pain. Relax the muscles slowly and completely. Repeat __________ times. Complete this exercise __________ times a day. Straight leg raises This exercise strengthens the muscles in front of your thigh (quadriceps) and the muscles that move your hips (hip flexors). Lie on your back with your left / right leg extended and your other knee bent. Tense the muscles in the front of your left / right thigh. You should see your kneecap slide up or see increased dimpling just above the knee. Your thigh may even shake a bit. Keep these muscles tight as you raise your leg 4-6 inches  (10-15 cm) off the floor. Do not let your knee bend. Hold this position for __________ seconds. Keep these muscles tense as you lower your leg. Relax your muscles slowly and completely after each repetition. Repeat __________ times. Complete this exercise __________ times a day. Hamstring, isometric  Lie on your back on a firm surface. Bend your left / right knee about __________ degrees. Dig your left / right heel into the surface as if you are trying to pull it toward your buttocks. Tighten the muscles in the back of your thighs (hamstring) to "dig" as hard as you can without increasing any pain. Hold this position for __________ seconds. Release the tension gradually and allow your muscles to relax completely for __________  seconds after each repetition. Repeat __________ times. Complete this exercise __________ times a day. Hamstring curls If told by your health care provider, do this exercise while wearing ankle weights. Begin with __________lb / kg weights. Then increase the weight by 1 lb (0.5 kg) increments. Do not wear ankle weights that are more than __________lb / kg. Lie on your abdomen with your legs straight. Bend your left / right knee as far as you can without feeling pain. Keep your hips flat against the floor. Hold this position for __________ seconds. Slowly lower your leg to the starting position. Repeat __________ times. Complete this exercise __________ times a day. Squats This exercise strengthens the muscles in front of your thigh and knee (quadriceps). Stand in front of a table, with your feet and knees pointing straight ahead. You may rest your hands on the table for balance but not for support. Slowly bend your knees and lower your hips like you are going to sit in a chair. Keep your weight over your heels, not over your toes. Keep your lower legs upright so they are parallel with the table legs. Do not let your hips go lower than your knees. Do not bend lower  than told by your health care provider. If your knee pain increases, do not bend as low. Hold the squat position for __________ seconds. Slowly push with your legs to return to standing. Do not use your hands to pull yourself to standing. Repeat __________ times. Complete this exercise __________ times a day. Wall slides This exercise strengthens the muscles in front of your thigh and knee (quadriceps). Lean your back against a smooth wall or door, and walk your feet out 18-24 inches (46-61 cm) from it. Place your feet hip-width apart. Slowly slide down the wall or door until your knees bend __________ degrees. Keep your knees over your heels, not over your toes. Keep your knees in line with your hips. Hold this position for __________ seconds. Repeat __________ times. Complete this exercise __________ times a day. Straight leg raises, side-lying This exercise strengthens the muscles that rotate the leg at the hip and move it away from your body (hip abductors). Lie on your side with your left / right leg in the top position. Lie so your head, shoulder, knee, and hip line up. You may bend your bottom knee to help you keep your balance. Roll your hips slightly forward so your hips are stacked directly over each other and your left / right knee is facing forward. Leading with your heel, lift your top leg 4-6 inches (10-15 cm). You should feel the muscles in your outer hip lifting. Do not let your foot drift forward. Do not let your knee roll toward the ceiling. Hold this position for __________ seconds. Slowly return your leg to the starting position. Let your muscles relax completely after each repetition. Repeat __________ times. Complete this exercise __________ times a day. Straight leg raises, prone This exercise stretches the muscles that move your hips away from the front of the pelvis (hip extensors). Lie on your abdomen on a firm surface. You can put a pillow under your hips if that  is more comfortable. Tense the muscles in your buttocks and lift your left / right leg about 4-6 inches (10-15 cm). Keep your knee straight as you lift your leg. Hold this position for __________ seconds. Slowly lower your leg to the starting position. Let your leg relax completely after each repetition. Repeat __________ times. Complete this  exercise __________ times a day. This information is not intended to replace advice given to you by your health care provider. Make sure you discuss any questions you have with your health care provider. Document Revised: 07/31/2021 Document Reviewed: 07/31/2021 Elsevier Patient Education  [place].

## 2021-11-19 NOTE — Progress Notes (Signed)
ESR WNL. CRP remains elevated but has improved slightly.  RF negative. CBC WNL.

## 2021-11-19 NOTE — Progress Notes (Signed)
X-rays of both hands are consistent with early OA. 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

## 2021-11-19 NOTE — Progress Notes (Signed)
X-rays of the lumbar spine consistent with facet joint arthropathy.

## 2021-11-20 LAB — CBC WITH DIFFERENTIAL/PLATELET
Absolute Monocytes: 410 cells/uL (ref 200–950)
Basophils Absolute: 50 cells/uL (ref 0–200)
Basophils Relative: 1 %
Eosinophils Absolute: 60 cells/uL (ref 15–500)
Eosinophils Relative: 1.2 %
HCT: 41 % (ref 35.0–45.0)
Hemoglobin: 13.8 g/dL (ref 11.7–15.5)
Lymphs Abs: 1805 cells/uL (ref 850–3900)
MCH: 30.9 pg (ref 27.0–33.0)
MCHC: 33.7 g/dL (ref 32.0–36.0)
MCV: 91.9 fL (ref 80.0–100.0)
MPV: 10.4 fL (ref 7.5–12.5)
Monocytes Relative: 8.2 %
Neutro Abs: 2675 cells/uL (ref 1500–7800)
Neutrophils Relative %: 53.5 %
Platelets: 361 10*3/uL (ref 140–400)
RBC: 4.46 10*6/uL (ref 3.80–5.10)
RDW: 12.5 % (ref 11.0–15.0)
Total Lymphocyte: 36.1 %
WBC: 5 10*3/uL (ref 3.8–10.8)

## 2021-11-20 LAB — PROTEIN ELECTROPHORESIS, SERUM, WITH REFLEX
Albumin ELP: 4.4 g/dL (ref 3.8–4.8)
Alpha 1: 0.4 g/dL — ABNORMAL HIGH (ref 0.2–0.3)
Alpha 2: 0.8 g/dL (ref 0.5–0.9)
Beta 2: 0.5 g/dL (ref 0.2–0.5)
Beta Globulin: 0.6 g/dL (ref 0.4–0.6)
Gamma Globulin: 1.1 g/dL (ref 0.8–1.7)
Total Protein: 7.7 g/dL (ref 6.1–8.1)

## 2021-11-20 LAB — SJOGRENS SYNDROME-B EXTRACTABLE NUCLEAR ANTIBODY: SSB (La) (ENA) Antibody, IgG: <1 AI

## 2021-11-20 LAB — SJOGRENS SYNDROME-A EXTRACTABLE NUCLEAR ANTIBODY: SSA (Ro) (ENA) Antibody, IgG: 1.4 AI — AB

## 2021-11-20 LAB — C-REACTIVE PROTEIN: CRP: 8.6 mg/L — ABNORMAL HIGH (ref <8.0)

## 2021-11-20 LAB — ANTI-NUCLEAR AB-TITER (ANA TITER)
ANA TITER: 1:40 {titer} — ABNORMAL HIGH
ANA Titer 1: 1:40 {titer} — ABNORMAL HIGH

## 2021-11-20 LAB — ANA: Anti Nuclear Antibody (ANA): POSITIVE — AB

## 2021-11-20 LAB — RHEUMATOID FACTOR: Rheumatoid fact SerPl-aCnc: <14 IU/mL (ref <14)

## 2021-11-20 LAB — SEDIMENTATION RATE: Sed Rate: 25 mm/h (ref 0–30)

## 2021-11-20 NOTE — Progress Notes (Signed)
SPEP did not reveal any abnormal proteins.

## 2021-11-20 NOTE — Progress Notes (Signed)
Ro antibody remains positive.  La antibody is negative.  ANA remains positive but is a lower titer and nonspecific pattern.

## 2021-11-27 DIAGNOSIS — R238 Other skin changes: Secondary | ICD-10-CM

## 2021-12-06 NOTE — Progress Notes (Signed)
Follow-up Visit   Date: 12/07/21   Brittney [name] MRN: [id number] DOB: Apr 25, 1971   Interim History: Brittney [name] is a 51 y.o. female  returning to the clinic for follow-up of CMT.  The patient was accompanied to the clinic by self.  There been increased intensity of numbness/tingling in the hands and feet and heaviness of the legs. She can also have tingling involving the entire lower legs.  Heaviness is more noticeable after working out. She has not had any falls.  Balance is good.  She has weakness in the left arm and leg and generally feet tired.  She continues to work and able to do all ADLs and IADLs.    Medications:  Current Outpatient Medications on File Prior to Visit  Medication Sig Dispense Refill   amLODipine (NORVASC) 2.5 MG tablet TAKE 1 TABLET BY MOUTH EVERY DAY 90 tablet 1   Ascorbic Acid (VITAMIN C PO) Place 2,000 mg under the tongue daily.      BIOTIN PO Take 1,000 mcg by mouth daily.      Cholecalciferol (VITAMIN D) 125 MCG (5000 UT) CAPS Take 5,000 Units by mouth daily.      Collagen 500 MG CAPS Take by mouth daily. Bone Broth  Scoop = 15 grams     linaclotide (LINZESS) 145 MCG CAPS capsule Take 1 capsule (145 mcg total) by mouth daily. 90 capsule 1   losartan (COZAAR) 100 MG tablet Take one tablet po daily 90 tablet 1   MAGNESIUM PO Take 400 mg by mouth daily.      meclizine (ANTIVERT) 25 MG tablet Take 1 tablet (25 mg total) by mouth 3 (three) times daily as needed for dizziness or nausea. 30 tablet 1   Multiple Vitamins-Minerals (MULTIVITAMIN PO) Take 1 tablet by mouth daily. Alive for Woman 50 +     Omega-3 Fatty Acids (FISH OIL PO) Take 400 mg by mouth daily.     TURMERIC PO Take 375 mg by mouth daily.      UNABLE TO FIND Take 10 mg by mouth as needed. CBD gummie     linaclotide (LINZESS) 145 MCG CAPS capsule Take 145 mcg by mouth daily before breakfast. (Patient not taking: Reported on 12/07/2021)     No current facility-administered medications on file  prior to visit.    Allergies:  Allergies  Allergen Reactions   Miralax [Polyethylene Glycol] Swelling    Tongue swelling   Vasotec [Enalapril]     cough    Vital Signs:  BP (!) 152/79    Pulse 81    Ht 4\' 11"  (1.499 m)    Wt 182 lb (82.6 kg)    SpO2 100%    BMI 36.76 kg/m   Neurological Exam: MENTAL STATUS including orientation to time, place, person, recent and remote memory, attention span and concentration, language, and fund of knowledge is normal.  Speech is not dysarthric.  CRANIAL NERVES:   Normal conjugate, extra-ocular eye movements in all directions of gaze.  No ptosis .    MOTOR:  Motor strength is 5/5 in all extremities, except mild bilateral hand weakness 4/5 and toe extension and flexion weakness 4/5.  Mild bilateral hand atrophy, no fasciculations or abnormal movements.  No pronator drift.  Tone is normal.    MSRs:  Reflexes are 2+/4 throughout, except 1+ bilateral triceps.  SENSORY:  Vibration,pinprick,and temperature is intact throughout. Rhomberg testing is negative  COORDINATION/GAIT: .  Gait narrow based and stable. Stressed and tandem gait intact  Data: Lab Results  Component Value Date   TSH 1.14 07/03/2020   MRI brain wo contrast 07/31/2018: Normal noncontrast MRI appearance of the brain aside from partially empty sella, which can be a normal anatomic variant but is also associated with idiopathic intracranial hypertension (pseudotumor cerebri). CSF opening pressure measurement would evaluate further.  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 Her exam is stable, which is reassuring  - Continue supportive care  2.  Whole body dysesthesia and generalized fatigue is not related to CMT.  Prior MRI brain from 2019 did not show anything which would suggest central process.   TSH is normal.   Return to clinic in 1 year   Thank you for allowing me to participate in patient's care.   If I can answer any additional questions, I would be pleased to do so.    Sincerely,    Debrah Granderson K. Posey Pronto, DO

## 2021-12-07 ENCOUNTER — Ambulatory Visit: Payer: 59 | Admitting: Neurology

## 2021-12-07 ENCOUNTER — Encounter: Payer: Self-pay | Admitting: Neurology

## 2021-12-07 ENCOUNTER — Other Ambulatory Visit: Payer: Self-pay

## 2021-12-07 VITALS — BP 152/79 | HR 81 | Ht 59.0 in | Wt 182.0 lb

## 2021-12-07 DIAGNOSIS — G600 Hereditary motor and sensory neuropathy: Secondary | ICD-10-CM | POA: Diagnosis not present

## 2021-12-07 NOTE — Patient Instructions (Signed)
Return to clinic in 1 year.

## 2022-02-11 NOTE — Telephone Encounter (Signed)
The use of nutrafol is recommended by many dermatologists for hair loss. Please clarify if she has a dermatologist, if so she can ask her dermatologist as well.   ?As an FYI Ashwagandha can interfere with thyroid testing and can occasionally cause GI upset.   ?Nutrafol should be safe for the patient to try if she would like to.

## 2022-02-11 NOTE — Telephone Encounter (Signed)
I am not aware of any safety data available for long term use of ashwagandha in patients with sjogren's syndrome. Please have the patient clarify with her dermatologist.

## 2022-02-21 ENCOUNTER — Encounter: Payer: Self-pay | Admitting: Family Medicine

## 2022-02-27 LAB — BASIC METABOLIC PANEL
BUN/Creatinine Ratio: 12 (ref 9–23)
BUN: 11 mg/dL (ref 6–24)
CO2: 23 mmol/L (ref 20–29)
Calcium: 10.2 mg/dL (ref 8.7–10.2)
Chloride: 98 mmol/L (ref 96–106)
Creatinine, Ser: 0.95 mg/dL (ref 0.57–1.00)
Glucose: 99 mg/dL (ref 70–99)
Potassium: 4.3 mmol/L (ref 3.5–5.2)
Sodium: 136 mmol/L (ref 134–144)
eGFR: 73 mL/min/{1.73_m2} (ref >59)

## 2022-02-28 ENCOUNTER — Ambulatory Visit: Payer: 59 | Admitting: Family Medicine

## 2022-02-28 ENCOUNTER — Other Ambulatory Visit: Payer: Self-pay | Admitting: *Deleted

## 2022-02-28 VITALS — BP 102/72 | HR 74 | Temp 97.9°F | Ht 59.0 in | Wt 174.8 lb

## 2022-02-28 DIAGNOSIS — I10 Essential (primary) hypertension: Secondary | ICD-10-CM

## 2022-02-28 DIAGNOSIS — Z13220 Encounter for screening for lipoid disorders: Secondary | ICD-10-CM

## 2022-02-28 DIAGNOSIS — Z79899 Other long term (current) drug therapy: Secondary | ICD-10-CM

## 2022-02-28 NOTE — Progress Notes (Signed)
? ?  Subjective:  ? ? Patient ID: Brittney [name], female    DOB: 1971/01/27, 51 y.o.   MRN: [id number] ? ?HPI ? ?Patient here for 4 month follow up.  She says blood pressures have been running good. ?Patient recently stopped amlodipine ?She is doing the best she can and eating healthy ?She is trying to exercise and lose weight ?She states she is actually done a very good job of things ?Review of Systems ? ?   ?Objective:  ? Physical Exam ?General-in no acute distress ?Eyes-no discharge ?Lungs-respiratory rate normal, CTA ?CV-no murmurs,RRR ?Extremities skin warm dry no edema ?Neuro grossly normal ?Behavior normal, alert ? ? ? ?We did discuss GLP-1 medicines but I discouraged her to using these currently until more is known and if it is covered by her insurance ?   ?Assessment & Plan:  ? ?I have encouraged her to continue with healthy eating regular activity and a possible even cut out me on some meals ?In addition to this increase vegetables and fruits on a regular basis ?Exercise regular basis ?Continue current blood pressure medicine but if blood pressure starts dropping lower reduce it to 50 mg notify us ?Follow-up in 6 months ? ?

## 2022-02-28 NOTE — Progress Notes (Signed)
Patient informed to have labs done before follow up visit. Pt verbalized understanding. Labs ordered in epic ?

## 2022-03-14 NOTE — Progress Notes (Deleted)
Office Visit Note  Patient: Brittney [name]             Date of Birth: 29-Aug-1971           MRN: [id number]             PCP: Kathyrn Drown, MD Referring: Kathyrn Drown, MD Visit Date: 03/22/2022 Occupation: '@GUAROCC'$ @  Subjective:  No chief complaint on file.   History of Present Illness: Brittney [name] is a 51 y.o. female ***   Activities of Daily Living:  Patient reports morning stiffness for *** {minute/hour:19697}.   Patient {ACTIONS;DENIES/REPORTS:21021675::"Denies"} nocturnal pain.  Difficulty dressing/grooming: {ACTIONS;DENIES/REPORTS:21021675::"Denies"} Difficulty climbing stairs: {ACTIONS;DENIES/REPORTS:21021675::"Denies"} Difficulty getting out of chair: {ACTIONS;DENIES/REPORTS:21021675::"Denies"} Difficulty using hands for taps, buttons, cutlery, and/or writing: {ACTIONS;DENIES/REPORTS:21021675::"Denies"}  No Rheumatology ROS completed.   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 Not treated for it now.   IBS (irritable  bowel syndrome)    dx in 2005 by primary MD   IBS (irritable bowel syndrome)    Obesity    Seasonal allergies    yr roud; was immunothrapy in childhood, does not need any more at this time    Family History  Problem Relation Age of Onset   Kidney failure Mother    Cancer Father 89       colon cancer   Healthy Sister    Healthy Brother    Cancer Brother    Healthy Sister    Eczema Daughter    Allergies Daughter    Eczema Son    Allergies Son    Past Surgical History:  Procedure Laterality Date   CESAREAN SECTION W/BTL  2007   cholectystectomy  2000   CHOLECYSTECTOMY  2000   COLONOSCOPY N/A 02/19/2013   Procedure: COLONOSCOPY;  Surgeon: Rogene Houston, MD;  [facility];  Service: Endoscopy;  Laterality: N/A;  830   COLONOSCOPY WITH PROPOFOL N/A 10/31/2020   Procedure: COLONOSCOPY WITH PROPOFOL;  Surgeon: Rogene Houston, MD;  [facility];  Service: Gastroenterology;  Laterality: N/A;  1130   POLYPECTOMY  10/31/2020   Procedure: POLYPECTOMY;  Surgeon: Rogene Houston, MD;  [facility];  Service: Gastroenterology;;   Social History   Social History Narrative   Married 14 years, 2 children ages 19 and 24.    Job: Pt accounting    Gets regular exercise.    Right Handed   Drinks Caffeine   Lives in a one story home    Immunization History  Administered Date(s) Administered   Influenza-Unspecified 09/17/2017, 10/26/2019   Moderna Sars-Covid-2  Vaccination 01/26/2020, 02/23/2020     Objective: Vital Signs: There were no vitals taken for this visit.   Physical Exam   Musculoskeletal Exam: ***  CDAI Exam: CDAI Score: -- Patient Global: --; Provider Global: -- Swollen: --; Tender: -- Joint Exam 03/22/2022   No joint exam has been documented for this visit   There is currently no information documented on the homunculus. Go to the Rheumatology activity and complete the homunculus joint exam.  Investigation: No additional  findings.  Imaging: No results found.  Recent Labs: Lab Results  Component Value Date   WBC 5.0 11/16/2021   HGB 13.8 11/16/2021   PLT 361 11/16/2021   NA 136 02/26/2022   K 4.3 02/26/2022   CL 98 02/26/2022   CO2 23 02/26/2022   GLUCOSE 99 02/26/2022   BUN 11 02/26/2022   CREATININE 0.95 02/26/2022   BILITOT 0.7 07/09/2021   ALKPHOS 38 07/09/2021   AST 20 07/09/2021   ALT 21 07/09/2021   PROT 7.7 11/16/2021   ALBUMIN 3.9 07/09/2021   CALCIUM 10.2 02/26/2022   GFRAA 120 07/05/2020    Speciality Comments: No specialty comments available.  Procedures:  No procedures performed Allergies: Miralax [polyethylene glycol] and Vasotec [enalapril]   Assessment / Plan:     Visit Diagnoses: No diagnosis found.  Orders: No orders of the defined types were placed in this encounter.  No orders of the defined types were placed in this encounter.   Face-to-face time spent with patient was *** minutes. Greater than 50% of time was spent in counseling and coordination of care.  Follow-Up Instructions: No follow-ups on file.   Earnestine Mealing, CMA  Note - This record has been created using Editor, commissioning.  Chart creation errors have been sought, but may not always  have been located. Such creation errors do not reflect on  the standard of medical care.

## 2022-03-22 ENCOUNTER — Ambulatory Visit: Payer: 59 | Admitting: Rheumatology

## 2022-03-22 DIAGNOSIS — M2241 Chondromalacia patellae, right knee: Secondary | ICD-10-CM

## 2022-03-22 DIAGNOSIS — M19041 Primary osteoarthritis, right hand: Secondary | ICD-10-CM

## 2022-03-22 DIAGNOSIS — M2242 Chondromalacia patellae, left knee: Secondary | ICD-10-CM

## 2022-03-22 DIAGNOSIS — I10 Essential (primary) hypertension: Secondary | ICD-10-CM

## 2022-03-22 DIAGNOSIS — M3501 Sicca syndrome with keratoconjunctivitis: Secondary | ICD-10-CM

## 2022-03-22 DIAGNOSIS — G5623 Lesion of ulnar nerve, bilateral upper limbs: Secondary | ICD-10-CM

## 2022-03-22 DIAGNOSIS — M545 Low back pain, unspecified: Secondary | ICD-10-CM

## 2022-03-22 DIAGNOSIS — G8929 Other chronic pain: Secondary | ICD-10-CM

## 2022-03-22 DIAGNOSIS — Z8719 Personal history of other diseases of the digestive system: Secondary | ICD-10-CM

## 2022-03-22 DIAGNOSIS — Z79899 Other long term (current) drug therapy: Secondary | ICD-10-CM

## 2022-03-22 DIAGNOSIS — M79642 Pain in left hand: Secondary | ICD-10-CM

## 2022-03-22 DIAGNOSIS — M2550 Pain in unspecified joint: Secondary | ICD-10-CM

## 2022-03-22 DIAGNOSIS — G600 Hereditary motor and sensory neuropathy: Secondary | ICD-10-CM

## 2022-03-22 DIAGNOSIS — M542 Cervicalgia: Secondary | ICD-10-CM

## 2022-05-15 ENCOUNTER — Other Ambulatory Visit: Payer: Self-pay | Admitting: Family Medicine

## 2022-05-21 ENCOUNTER — Emergency Department (HOSPITAL_COMMUNITY)
Admission: EM | Admit: 2022-05-21 | Discharge: 2022-05-21 | Discharge status: Against Medical Advice | Payer: 59 | Attending: Emergency Medicine | Admitting: Emergency Medicine

## 2022-05-21 ENCOUNTER — Other Ambulatory Visit: Payer: Self-pay

## 2022-05-21 ENCOUNTER — Encounter (HOSPITAL_COMMUNITY): Payer: Self-pay

## 2022-05-21 ENCOUNTER — Emergency Department (HOSPITAL_COMMUNITY): Payer: 59

## 2022-05-21 DIAGNOSIS — Z5321 Procedure and treatment not carried out due to patient leaving prior to being seen by health care provider: Secondary | ICD-10-CM | POA: Diagnosis not present

## 2022-05-21 DIAGNOSIS — R531 Weakness: Secondary | ICD-10-CM | POA: Insufficient documentation

## 2022-05-21 DIAGNOSIS — R0602 Shortness of breath: Secondary | ICD-10-CM | POA: Diagnosis not present

## 2022-05-21 DIAGNOSIS — R0789 Other chest pain: Secondary | ICD-10-CM | POA: Insufficient documentation

## 2022-05-21 LAB — BASIC METABOLIC PANEL
Anion gap: 7 (ref 5–15)
BUN: 10 mg/dL (ref 6–20)
CO2: 25 mmol/L (ref 22–32)
Calcium: 9.2 mg/dL (ref 8.9–10.3)
Chloride: 103 mmol/L (ref 98–111)
Creatinine, Ser: 0.64 mg/dL (ref 0.44–1.00)
GFR, Estimated: >60 mL/min (ref >60)
Glucose, Bld: 107 mg/dL — ABNORMAL HIGH (ref 70–99)
Potassium: 3.7 mmol/L (ref 3.5–5.1)
Sodium: 135 mmol/L (ref 135–145)

## 2022-05-21 LAB — CBC
HCT: 39.9 % (ref 36.0–46.0)
Hemoglobin: 13.1 g/dL (ref 12.0–15.0)
MCH: 30.7 pg (ref 26.0–34.0)
MCHC: 32.8 g/dL (ref 30.0–36.0)
MCV: 93.4 fL (ref 80.0–100.0)
Platelets: 300 10*3/uL (ref 150–400)
RBC: 4.27 MIL/uL (ref 3.87–5.11)
RDW: 13.7 % (ref 11.5–15.5)
WBC: 7.3 10*3/uL (ref 4.0–10.5)
nRBC: 0 % (ref 0.0–0.2)

## 2022-05-21 LAB — TROPONIN I (HIGH SENSITIVITY)
Troponin I (High Sensitivity): <2 ng/L (ref <18)
Troponin I (High Sensitivity): <2 ng/L (ref <18)

## 2022-05-21 NOTE — ED Triage Notes (Signed)
Pt reports chest tightness and ShOB that lasted an hour on Sunday. Pt has had feelings of generalized weakness and tiredness. CP returned today. Pt denies active CP during triage.

## 2022-05-21 NOTE — ED Provider Triage Note (Signed)
Emergency Medicine Provider Triage Evaluation Note  Brittney [name] , a 51 y.o. female  was evaluated in triage.  Pt complains of chest pain and shortness of breath.  Patient states that pain began on Sunday when she was sitting on her sofa and 10 to 15 minutes later was accompanied by shortness of breath.  States that pain feels like a squeezing or tightness.  Episode lasted for approximately an hour before resolved spontaneously.  She had another episode today when she was sitting at her desk at work where she experienced chest pain and shortness of breath and was told by her supervisor to come the emergency department for evaluation of symptoms.  Denies worsening with physical activity.  States that pain seems to be worse when she is sitting up.  Has a history of Charcot-Marie-Tooth as well as Sjogren's.  Denies fever, chills, night sweats, abdominal pain, nausea/vomiting.  Denies tobacco or cocaine use  Review of Systems  Positive: See above Negative:   Physical Exam  BP 134/80 (BP Location: Right Arm)   Pulse 66   Temp 99 F (37.2 C) (Oral)   Resp 18   Ht 4' 11.5" (1.511 m)   Wt 77.9 kg   LMP 04/07/2022   SpO2 100%   BMI 34.10 kg/m  Gen:   Awake, no distress   Resp:  Normal effort  MSK:   Moves extremities without difficulty  Other:    Medical Decision Making  Medically screening exam initiated at 1:50 PM.  Appropriate orders placed.  Brittney [name] was informed that the remainder of the evaluation will be completed by another provider, this initial triage assessment does not replace that evaluation, and the importance of remaining in the ED until their evaluation is complete.     Brittney [name], [place] 05/21/22 1352

## 2022-06-11 ENCOUNTER — Encounter (INDEPENDENT_AMBULATORY_CARE_PROVIDER_SITE_OTHER): Payer: Self-pay | Admitting: Gastroenterology

## 2022-06-11 ENCOUNTER — Telehealth (INDEPENDENT_AMBULATORY_CARE_PROVIDER_SITE_OTHER): Payer: Self-pay | Admitting: *Deleted

## 2022-06-11 ENCOUNTER — Ambulatory Visit (INDEPENDENT_AMBULATORY_CARE_PROVIDER_SITE_OTHER): Payer: 59 | Admitting: Gastroenterology

## 2022-06-11 VITALS — Ht 59.5 in | Wt 173.0 lb

## 2022-06-11 DIAGNOSIS — K5909 Other constipation: Secondary | ICD-10-CM

## 2022-06-11 MED ORDER — LINACLOTIDE 145 MCG PO CAPS: 145.0000 ug | ORAL_CAPSULE | Freq: Every day | ORAL | 3 refills | Status: DC

## 2022-06-11 NOTE — Progress Notes (Signed)
Primary Care Physician:  Kathyrn Drown, MD  Primary GI: previously [facility]  Patient Location: Home   Provider Location: [facility]   Reason for Visit: medication refill, follow up constipation   Persons present on the virtual encounter, with roles: Brittney Falzone L. Alver Sorrow, MSN, APRN, AGNP-C, Lendell Caprice, Patient   Total time (minutes) spent on medical discussion: 5 minutes  Virtual Visit via telephone visit is conducted virtually and was requested by patient.   I connected with Brittney [name] on 06/11/22 at  2:00 PM EDT by telephone and verified that I am speaking with the correct person using two identifiers.   I discussed the limitations, risks, security and privacy concerns of performing an evaluation and management service by telephone and the availability of in person appointments. I also discussed with the patient that there may be a patient responsible charge related to this service. The patient expressed understanding and agreed to proceed.  Chief Complaint  Patient presents with   Constipation    Follow up on constipation. Tries to take linzess 145 mcg daily.  Has a BM at least 4 days a week.    History of Present Illness: Brittney [name] is a 51 year old female with history of acid reflux, DM, HTN, IBS.   Patient presenting today for follow up of constipation, refill of linzess  History:  Last seen 09/28/20, having 1-2 BMs per day, sometimes going 1-2 days in between. Has previously been on 78mg but stools very hard with that, stools ranging from hard to loose on 1478m dose. maintained on Linezz 14552mdaily at that time.   Present: Patient reports that constipation is well managed as long as she takes her linzess. She has to take an occasional laxative, maybe 3x/month, will wait until she has gone about 3 days without a BM before she takes this. Having a BM 4-5x/week. She feels that she does not have to strain to defecate unless she misses a dose of her linzess.  Denies rectal bleeding, melena, or abdominal pain. She has occasional looser stools on the linzess. She denies weight loss or changes in appetite. Is drinking 3-4 16oz bottles of water per day. Diet is high in fruits and veggies.   Has occasional nausea and bloating if she takes a laxative but will pass the next day.   No red flag symptoms. Patient denies melena, hematochezia, nausea, vomiting, dysphagia, odyonophagia, early satiety or weight loss.   Last colonoscopy 10/31/20: - Three small polyps at the hepatic flexure, removed with a cold snare. Resected and retrieved. - Two small polyps at the hepatic flexure. Biopsied.One is hyperplastic polyp.  2 polyps are tubular adenomas and 2 polyps are sessile serrated polyps. - Anal papilla(e) were hypertrophied. - Small lipoma at the anus.  Repeat colonoscopy in nov 2026  Past Medical History:  Diagnosis Date   Acid reflux    Chest pain    intermittent x 1 yr   Diabetic mellitus, gestational    Hypertension    during pregnancy. Not treated for it now.   IBS (irritable bowel syndrome)    dx in 2005 by primary MD   IBS (irritable bowel syndrome)    Obesity    Seasonal allergies    yr roud; was immunothrapy in childhood, does not need any more at this time     Past Surgical History:  Procedure Laterality Date   CESAREAN SECTION W/BTL  2007   cholectystectomy  2000   CHOLECYSTECTOMY  2000  COLONOSCOPY N/A 02/19/2013   Procedure: COLONOSCOPY;  Surgeon: Rogene Houston, MD;  [facility];  Service: Endoscopy;  Laterality: N/A;  830   COLONOSCOPY WITH PROPOFOL N/A 10/31/2020   Procedure: COLONOSCOPY WITH PROPOFOL;  Surgeon: Rogene Houston, MD;  [facility];  Service: Gastroenterology;  Laterality: N/A;  1130   POLYPECTOMY  10/31/2020   Procedure: POLYPECTOMY;  Surgeon: Rogene Houston, MD;  [facility];  Service: Gastroenterology;;     Current Meds  Medication Sig   Cholecalciferol (VITAMIN D)  125 MCG (5000 UT) CAPS Take 5,000 Units by mouth daily.    Collagen 500 MG CAPS Take by mouth daily. Bone Broth  Scoop = 15 grams   linaclotide (LINZESS) 145 MCG CAPS capsule Take 145 mcg by mouth daily before breakfast.   losartan (COZAAR) 100 MG tablet Take one tablet po daily   MAGNESIUM PO Take 400 mg by mouth daily.    meclizine (ANTIVERT) 25 MG tablet Take 1 tablet (25 mg total) by mouth 3 (three) times daily as needed for dizziness or nausea.   Multiple Vitamins-Minerals (MULTIVITAMIN PO) Take 1 tablet by mouth daily. Alive for Woman 50 +   Omega-3 Fatty Acids (FISH OIL PO) Take 400 mg by mouth daily.   TURMERIC PO Take 375 mg by mouth daily.    UNABLE TO FIND Take 10 mg by mouth as needed. CBD gummie     Family History  Problem Relation Age of Onset   Kidney failure Mother    Cancer Father 24       colon cancer   Healthy Sister    Healthy Brother    Cancer Brother    Healthy Sister    Eczema Daughter    Allergies Daughter    Eczema Son    Allergies Son     Social History   Socioeconomic History   Marital status: Married    Spouse name: Not on file   Number of children: 2   Years of education: Not on file   Highest education level: Not on file  Occupational History   Occupation: patient advocate    Employer: [employer]  Tobacco Use   Smoking status: Never    Passive exposure: Never   Smokeless tobacco: Never  Vaping Use   Vaping Use: Never used  Substance and Sexual Activity   Alcohol use: Yes    Comment: occassionally    Drug use: Never   Sexual activity: Not on file  Other Topics Concern   Not on file  Social History Narrative   Married 14 years, 2 children ages 85 and 13.    Job: Pt accounting    Gets regular exercise.    Right Handed   Drinks Caffeine   Lives in a one story home    Social Determinants of Health   Financial Resource Strain: Not on file  Food Insecurity: Not on file  Transportation Needs: Not on file  Physical Activity: Not on  file  Stress: Not on file  Social Connections: Not on file   Review of Systems: Gen: Denies fever, chills, anorexia. Denies fatigue, weakness, weight loss.  CV: Denies chest pain, palpitations, syncope, peripheral edema, and claudication. Resp: Denies dyspnea at rest, cough, wheezing, coughing up blood, and pleurisy. GI: see HPI Derm: Denies rash, itching, dry skin Psych: Denies depression, anxiety, memory loss, confusion. No homicidal or suicidal ideation.  Heme: Denies bruising, bleeding, and enlarged lymph nodes.  Observations/Objective: No distress.  Unable to perform physical exam due to telephone encounter. No video available.   Assessment and Plan: Brittney [name] is a 51 year old female with history of acid reflux, DM, HTN, IBS.   Patient presents virtually for follow up of constipation, maintained on Linzess 198mg daily. Doing well on linzess, has to take an occasional laxative maybe 3x/month if she goes 3 days or more without a BM, otherwise, having 4-5 BMs per week, stools range from somewhat hard to sometimes looser. Diet is with ample fruits and veggies and water intake is good. She denies abdominal pain, rectal bleeding or melena. No weight loss or changes in appetite. Feels that current regimen is working well for her.  -continue with ample water intake, diet high in fruits, veggies and whole grains -Rx refill Linzess 1411m daily -pt to let me know if she has new or worsening GI symptoms  Follow Up Instructions: Follow up 1 year, sooner if new or worsening GI symptoms    I discussed the assessment and treatment plan with the patient. The patient was provided an opportunity to ask questions and all were answered. The patient agreed with the plan and demonstrated an understanding of the instructions.   The patient was advised to call back or seek an in-person evaluation if the symptoms worsen or if the condition fails to improve as anticipated.  I provided 5 minutes of NON  face-to-face time during this telephone encounter  Jia Mohamed L. CaAlver SorrowMSN, APRN, AGNP-C Adult-Gerontology Nurse Practitioner [facility]

## 2022-06-11 NOTE — Patient Instructions (Signed)
Will continue with linzess 158mg daily, refill sent Continue diet high in fruits, veggies, whole grains and plenty of water Please make me aware of new or worsening symptoms  Follow up 1 year

## 2022-06-18 NOTE — Telephone Encounter (Signed)
error 

## 2022-07-03 ENCOUNTER — Other Ambulatory Visit: Payer: Self-pay | Admitting: Family Medicine

## 2022-07-31 ENCOUNTER — Ambulatory Visit: Payer: 59 | Admitting: Family Medicine

## 2022-08-26 ENCOUNTER — Encounter: Payer: Self-pay | Admitting: Family Medicine

## 2022-08-26 ENCOUNTER — Ambulatory Visit: Payer: 59 | Admitting: Family Medicine

## 2022-08-26 VITALS — BP 125/77 | HR 66 | Wt 176.6 lb

## 2022-08-26 DIAGNOSIS — E559 Vitamin D deficiency, unspecified: Secondary | ICD-10-CM | POA: Diagnosis not present

## 2022-08-26 DIAGNOSIS — I10 Essential (primary) hypertension: Secondary | ICD-10-CM | POA: Diagnosis not present

## 2022-08-26 DIAGNOSIS — G4730 Sleep apnea, unspecified: Secondary | ICD-10-CM

## 2022-08-26 DIAGNOSIS — E785 Hyperlipidemia, unspecified: Secondary | ICD-10-CM | POA: Diagnosis not present

## 2022-08-26 DIAGNOSIS — G600 Hereditary motor and sensory neuropathy: Secondary | ICD-10-CM

## 2022-08-26 NOTE — Patient Instructions (Signed)

## 2022-08-26 NOTE — Progress Notes (Signed)
   Subjective:    Patient ID: Brittney [name], female    DOB: [birth year], 51 y.o.   MRN: [id number]  HPI Pt arrives to follow up on HTN. Pt states her blood pressure did go up with COVID. Pt is taking Losartan 100 mg daily.  Pt states no issues with blood pressure at this time.    Was on a strict diet but recently has been allowing for more sweets and carbohydrates.  States she will try to get back to better eating  Does have Charcot-Marie-Tooth disease CMT pai n and numbness hand and fingers numb Grip difficult  Using compression stockings Patient with daytime fatigue tiredness sleepiness does not feel well rested in the morning feels rundown At times energy level subpar   Review of Systems     Objective:   Physical Exam  General-in no acute distress Eyes-no discharge Lungs-respiratory rate normal, CTA CV-no murmurs,RRR Extremities skin warm dry no edema Neuro grossly normal Behavior normal, alert  Morbid obesity portion control regular physical activity recommended     Assessment & Plan:   1. Primary hypertension Blood pressure good control continue current measures minimize salt continue to stay physically active  2. 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  Recommend neurology consultation with Dr.Athar for for sleep study - Ambulatory referral to Neurology  3. Vitamin D deficiency History of this check level - VITAMIN D 25 Hydroxy (Vit-D Deficiency, Fractures)  4. Hyperlipidemia, unspecified hyperlipidemia type History of this check lipids healthy diet recommended - Lipid panel

## 2022-09-06 NOTE — Progress Notes (Signed)
Office Visit Note  Patient: Brittney [name]             Date of Birth: 1971/03/13           MRN: [id number]             PCP: Kathyrn Drown, MD Referring: Kathyrn Drown, MD Visit Date: 09/20/2022 Occupation: @GUAROCC @  Subjective:  Dry mouth and dry eyes  History of Present Illness: Brittney [name] is a 51 y.o. female history of Sjogren's, osteoarthritis.  She states she has been having increased dry mouth and dry eyes symptoms.  She has been using [employer] products.  She states she is very thirsty at night and has to drink water frequently.  She has difficulty talking due to dry mouth.  She continues to have pain and stiffness in her bilateral hands and her knee joints.  She continues to have lower back pain.  Activities of Daily Living:  Patient reports morning stiffness for all day. Patient Reports nocturnal pain.  Difficulty dressing/grooming: Denies Difficulty climbing stairs: Denies Difficulty getting out of chair: Denies Difficulty using hands for taps, buttons, cutlery, and/or writing: Reports  Review of Systems  Constitutional:  Positive for fatigue.  HENT:  Positive for mouth dryness. Negative for mouth sores.   Eyes:  Positive for dryness.  Respiratory:  Negative for shortness of breath.   Cardiovascular:  Negative for chest pain and palpitations.  Gastrointestinal:  Positive for constipation. Negative for blood in stool and diarrhea.  Endocrine: Negative for increased urination.  Genitourinary:  Positive for nocturia.  Musculoskeletal:  Positive for joint pain, joint pain, joint swelling, myalgias, morning stiffness, muscle tenderness and myalgias. Negative for gait problem.  Skin:  Positive for hair loss. Negative for color change, rash and sensitivity to sunlight.  Allergic/Immunologic: Negative for susceptible to infections.  Neurological:  Positive for light-headedness. Negative for dizziness and headaches.  Hematological:  Negative for swollen glands.   Psychiatric/Behavioral:  Positive for sleep disturbance. Negative for depressed mood. The patient is not nervous/anxious.     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 Not treated for it now.   IBS (irritable bowel syndrome)    dx in 2005 by primary MD   IBS (irritable bowel syndrome)    Obesity    Seasonal allergies    yr roud; was immunothrapy in childhood, does not need any more at this time   Sjogren's syndrome [facility])     Family History  Problem Relation Age of Onset   Kidney failure Mother    Cancer  Father 10       colon cancer   Healthy Sister    Healthy Brother    Cancer Brother    Healthy Sister    Eczema Daughter    Allergies Daughter    Eczema Son    Allergies Son    Past Surgical History:  Procedure Laterality Date   CESAREAN SECTION W/BTL  2007   cholectystectomy  2000   CHOLECYSTECTOMY  2000   COLONOSCOPY N/A 02/19/2013   Procedure: COLONOSCOPY;  Surgeon: Rogene Houston, MD;   Location: [facility];  Service: Endoscopy;  Laterality: N/A;  830   COLONOSCOPY WITH PROPOFOL N/A 10/31/2020   Procedure: COLONOSCOPY WITH PROPOFOL;  Surgeon: Rogene Houston, MD;  Location: [facility];  Service: Gastroenterology;  Laterality: N/A;  1130   POLYPECTOMY  10/31/2020   Procedure: POLYPECTOMY;  Surgeon: Rogene Houston, MD;  Location: [facility];  Service: Gastroenterology;;   Social History   Social History Narrative   Married 14 years, 2 children ages 48 and 57.    Job: Pt accounting    Gets regular exercise.    Right Handed   Drinks Caffeine   Lives in a one story home    Immunization History  Administered Date(s) Administered   Influenza-Unspecified 09/17/2017, 10/26/2019   Moderna Sars-Covid-2 Vaccination 01/26/2020, 02/23/2020     Objective: Vital Signs: BP (!) 147/88 (BP Location: Left Arm, Patient Position: Sitting, Cuff Size: Normal)   Pulse 66   Resp 13   Ht 4' 11"  (1.499 m)   Wt 178 lb (80.7 kg)   BMI 35.95 kg/m    Physical Exam Vitals and nursing note reviewed.  Constitutional:      Appearance: She is well-developed.  HENT:     Head: Normocephalic and atraumatic.  Eyes:     Conjunctiva/sclera: Conjunctivae normal.  Cardiovascular:     Rate and Rhythm: Normal rate and regular rhythm.     Heart sounds: Normal heart sounds.  Pulmonary:     Effort: Pulmonary effort is normal.     Breath sounds: Normal breath sounds.  Abdominal:     General: Bowel sounds are normal.     Palpations: Abdomen is soft.  Musculoskeletal:     Cervical back: Normal range of motion.  Lymphadenopathy:     Cervical: No cervical adenopathy.  Skin:    General: Skin is warm and dry.     Capillary Refill: Capillary refill takes less than 2 seconds.  Neurological:     Mental Status: She is alert and oriented to person, place, and time.  Psychiatric:        Behavior: Behavior normal.      Musculoskeletal Exam: C-spine was in good range of motion.  Shoulder  joints, elbow joints, wrist joints, MCPs PIPs and DIPs with good range of motion with no synovitis.  Hip joints and knee joints with good range of motion without any warmth swelling or effusion.  There was no tenderness over ankles or MTPs.  CDAI Exam: CDAI Score: -- Patient Global: --; Provider Global: -- Swollen: --; Tender: -- Joint Exam 09/20/2022   No joint exam has been documented for this visit   There is currently no information documented on the homunculus. Go to the Rheumatology activity and complete the homunculus joint exam.  Investigation: No additional findings.  Imaging: No results found.  Recent Labs: Lab Results  Component Value Date   WBC 7.3 05/21/2022   HGB 13.1 05/21/2022   PLT 300  05/21/2022   NA 135 05/21/2022   K 3.7 05/21/2022   CL 103 05/21/2022   CO2 25 05/21/2022   GLUCOSE 107 (H) 05/21/2022   BUN 10 05/21/2022   CREATININE 0.64 05/21/2022   BILITOT 0.7 07/09/2021   ALKPHOS 38 07/09/2021   AST 20 07/09/2021   ALT 21 07/09/2021   PROT 7.7 11/16/2021   ALBUMIN 3.9 07/09/2021   CALCIUM 9.2 05/21/2022   GFRAA 120 07/05/2020    Speciality Comments: No specialty comments available.  Procedures:  No procedures performed Allergies: Miralax [polyethylene glycol] and Vasotec [enalapril]   Assessment / Plan:     Visit Diagnoses: Sjogren's syndrome with keratoconjunctivitis sicca ([facility]) - ANA 1: 160 nuclear and centromere pattern, positive anti-Ro antibody.  Rest of the ENA negative, chronic mild sicca symptoms:   -She continues to have dry mouth and dry eye symptoms.  She states her sicca symptoms are getting worse.  We had a detailed discussion regarding different treatment options send over-the-counter products.  I sent a prescription for pilocarpine 5 mg p.o. 3 times daily as needed.  Side effects of pilocarpine were discussed at length.  I will also obtain labs today.  Plan: CBC with Differential/Platelet, COMPLETE METABOLIC PANEL WITH GFR,  Urinalysis, Routine w reflex microscopic, Serum protein electrophoresis with reflex, Sedimentation rate  High risk medication use - Declined Plaquenil in August 2020 and at the office visit on 11/17/20.  Primary osteoarthritis of both hands-she has discomfort in her bilateral hands.  No PIP or DIP thickening or synovitis was noted.  Joint protection was discussed.  A handout on hand exercises was given.  Chondromalacia patellae, right knee-she is history of pain and discomfort in her bilateral knee joints.  No warmth swelling or effusion was noted.  A handout on knee joint exercises was given.  Chondromalacia, patella, left  Chronic midline low back pain without sciatica - HLA-B27 antigen negative on 06/22/2021: She had good mobility in her lumbar spine with discomfort.  She continues to have lower back pain.  Core strengthening exercises were discussed and a handout on back exercises was given.  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  Charcot-Marie-Tooth disease type 1A - Diagnosed by Dr. Posey Pronto  Essential hypertension, benign  History of IBS  History of gastroesophageal reflux (GERD)  Orders: Orders Placed This Encounter  Procedures   CBC with Differential/Platelet   COMPLETE METABOLIC PANEL WITH GFR   Urinalysis, Routine w reflex microscopic   Serum protein electrophoresis with reflex   Sedimentation rate   Meds ordered this encounter  Medications   pilocarpine (SALAGEN) 5 MG tablet    Sig: Take 1 tablet (5 mg total) by mouth 3 (three) times daily as needed.    Dispense:  90 tablet    Refill:  2     Follow-Up Instructions: Return in about 6 months (around 03/22/2023) for Sjogren's, Osteoarthritis.   Bo Merino, MD  Note - This record has been created using Editor, commissioning.  Chart creation errors have been sought, but may not always  have been located. Such creation errors do not reflect on  the standard of medical care.

## 2022-09-20 ENCOUNTER — Encounter: Payer: Self-pay | Admitting: Rheumatology

## 2022-09-20 ENCOUNTER — Ambulatory Visit: Payer: 59 | Attending: Rheumatology | Admitting: Rheumatology

## 2022-09-20 VITALS — BP 147/88 | HR 66 | Resp 13 | Ht 59.0 in | Wt 178.0 lb

## 2022-09-20 DIAGNOSIS — M79641 Pain in right hand: Secondary | ICD-10-CM

## 2022-09-20 DIAGNOSIS — M19041 Primary osteoarthritis, right hand: Secondary | ICD-10-CM | POA: Diagnosis not present

## 2022-09-20 DIAGNOSIS — M545 Low back pain, unspecified: Secondary | ICD-10-CM

## 2022-09-20 DIAGNOSIS — M3501 Sicca syndrome with keratoconjunctivitis: Secondary | ICD-10-CM

## 2022-09-20 DIAGNOSIS — G5623 Lesion of ulnar nerve, bilateral upper limbs: Secondary | ICD-10-CM

## 2022-09-20 DIAGNOSIS — M19042 Primary osteoarthritis, left hand: Secondary | ICD-10-CM

## 2022-09-20 DIAGNOSIS — Z8719 Personal history of other diseases of the digestive system: Secondary | ICD-10-CM

## 2022-09-20 DIAGNOSIS — M2241 Chondromalacia patellae, right knee: Secondary | ICD-10-CM | POA: Diagnosis not present

## 2022-09-20 DIAGNOSIS — M2550 Pain in unspecified joint: Secondary | ICD-10-CM

## 2022-09-20 DIAGNOSIS — M542 Cervicalgia: Secondary | ICD-10-CM

## 2022-09-20 DIAGNOSIS — Z79899 Other long term (current) drug therapy: Secondary | ICD-10-CM

## 2022-09-20 DIAGNOSIS — G8929 Other chronic pain: Secondary | ICD-10-CM

## 2022-09-20 DIAGNOSIS — I10 Essential (primary) hypertension: Secondary | ICD-10-CM

## 2022-09-20 DIAGNOSIS — G600 Hereditary motor and sensory neuropathy: Secondary | ICD-10-CM

## 2022-09-20 DIAGNOSIS — M2242 Chondromalacia patellae, left knee: Secondary | ICD-10-CM

## 2022-09-20 LAB — HM MAMMOGRAPHY

## 2022-09-20 MED ORDER — PILOCARPINE HCL 5 MG PO TABS: 5.0000 mg | ORAL_TABLET | Freq: Three times a day (TID) | ORAL | 2 refills | Status: DC | PRN

## 2022-09-20 NOTE — Patient Instructions (Signed)
Hand Exercises Hand exercises can be helpful for almost anyone. These exercises can strengthen the hands, improve flexibility and movement, and increase blood flow to the hands. These results can make work and daily tasks easier. Hand exercises can be especially helpful for people who have joint pain from arthritis or have nerve damage from overuse (carpal tunnel syndrome). These exercises can also help people who have injured a hand. Exercises Most of these hand exercises are gentle stretching and motion exercises. It is usually safe to do them often throughout the day. Warming up your hands before exercise may help to reduce stiffness. You can do this with gentle massage or by placing your hands in warm water for 10-15 minutes. It is normal to feel some stretching, pulling, tightness, or mild discomfort as you begin new exercises. This will gradually improve. Stop an exercise right away if you feel sudden, severe pain or your pain gets worse. Ask your health care provider which exercises are best for you. Knuckle bend or "claw" fist  Stand or sit with your arm, hand, and all five fingers pointed straight up. Make sure to keep your wrist straight during the exercise. Gently bend your fingers down toward your palm until the tips of your fingers are touching the top of your palm. Keep your big knuckle straight and just bend the small knuckles in your fingers. Hold this position for __________ seconds. Straighten (extend) your fingers back to the starting position. Repeat this exercise 5-10 times with each hand. Full finger fist  Stand or sit with your arm, hand, and all five fingers pointed straight up. Make sure to keep your wrist straight during the exercise. Gently bend your fingers into your palm until the tips of your fingers are touching the middle of your palm. Hold this position for __________ seconds. Extend your fingers back to the starting position, stretching every joint fully. Repeat  this exercise 5-10 times with each hand. Straight fist Stand or sit with your arm, hand, and all five fingers pointed straight up. Make sure to keep your wrist straight during the exercise. Gently bend your fingers at the big knuckle, where your fingers meet your hand, and the middle knuckle. Keep the knuckle at the tips of your fingers straight and try to touch the bottom of your palm. Hold this position for __________ seconds. Extend your fingers back to the starting position, stretching every joint fully. Repeat this exercise 5-10 times with each hand. Tabletop  Stand or sit with your arm, hand, and all five fingers pointed straight up. Make sure to keep your wrist straight during the exercise. Gently bend your fingers at the big knuckle, where your fingers meet your hand, as far down as you can while keeping the small knuckles in your fingers straight. Think of forming a tabletop with your fingers. Hold this position for __________ seconds. Extend your fingers back to the starting position, stretching every joint fully. Repeat this exercise 5-10 times with each hand. Finger spread  Place your hand flat on a table with your palm facing down. Make sure your wrist stays straight as you do this exercise. Spread your fingers and thumb apart from each other as far as you can until you feel a gentle stretch. Hold this position for __________ seconds. Bring your fingers and thumb tight together again. Hold this position for __________ seconds. Repeat this exercise 5-10 times with each hand. Making circles  Stand or sit with your arm, hand, and all five fingers pointed   straight up. Make sure to keep your wrist straight during the exercise. Make a circle by touching the tip of your thumb to the tip of your index finger. Hold for __________ seconds. Then open your hand wide. Repeat this motion with your thumb and each finger on your hand. Repeat this exercise 5-10 times with each hand. Thumb  motion  Sit with your forearm resting on a table and your wrist straight. Your thumb should be facing up toward the ceiling. Keep your fingers relaxed as you move your thumb. Lift your thumb up as high as you can toward the ceiling. Hold for __________ seconds. Bend your thumb across your palm as far as you can, reaching the tip of your thumb for the small finger (pinkie) side of your palm. Hold for __________ seconds. Repeat this exercise 5-10 times with each hand. Grip strengthening  Hold a stress ball or other soft ball in the middle of your hand. Slowly increase the pressure, squeezing the ball as much as you can without causing pain. Think of bringing the tips of your fingers into the middle of your palm. All of your finger joints should bend when doing this exercise. Hold your squeeze for __________ seconds, then relax. Repeat this exercise 5-10 times with each hand. Contact a health care provider if: Your hand pain or discomfort gets much worse when you do an exercise. Your hand pain or discomfort does not improve within 2 hours after you exercise. If you have any of these problems, stop doing these exercises right away. Do not do them again unless your health care provider says that you can. Get help right away if: You develop sudden, severe hand pain or swelling. If this happens, stop doing these exercises right away. Do not do them again unless your health care provider says that you can. This information is not intended to replace advice given to you by your health care provider. Make sure you discuss any questions you have with your health care provider. Document Revised: 03/08/2021 Document Reviewed: 03/08/2021 Elsevier Patient Education  2023 [employer] Exercises for Chronic Knee Pain Chronic knee pain is pain that lasts longer than 3 months. For most people with chronic knee pain, exercise and weight loss is an important part of treatment. Your health care provider may want  you to focus on: Strengthening the muscles that support your knee. This can take pressure off your knee and lessen pain. Preventing knee stiffness. Maintaining or increasing how far you can move your knee. Losing weight (if this applies) to take pressure off your knee, decrease your risk for injury, and make it easier for you to exercise. Your health care provider will help you develop an exercise program that matches your needs and physical abilities. Below are simple, low-impact exercises you can do at home. Ask your health care provider or a physical therapist how often you should do your exercise program and how many times to repeat each exercise. General safety tips Follow these safety tips for exercising with chronic knee pain: Get your health care provider's approval before doing any exercises. Start slowly and stop any time an exercise causes pain. Do not exercise if your knee pain is flaring up. Warm up first. Stretching a cold muscle can cause an injury. Do 5-10 minutes of easy movement or light stretching before beginning your exercise routine. Do 5-10 minutes of low-impact activity (like walking or cycling) before starting strengthening exercises. Contact your health care provider any time you have pain   during or after exercising. Exercise may cause discomfort but should not be painful. It is normal to be a little stiff or sore after exercising.  Stretching and range-of-motion exercises Front thigh stretch  Stand up straight and support your body by holding on to a chair or resting one hand on a wall. With your legs straight and close together, bend one knee to lift your heel up toward your buttocks. Using one hand for support, grab your ankle with your free hand. Pull your foot up closer toward your buttocks to feel the stretch in front of your thigh. Hold the stretch for 30 seconds. Repeat __________ times. Complete this exercise __________ times a day. Back thigh stretch  Sit  on the floor with your back straight and your legs out straight in front of you. Place the palms of your hands on the floor and slide them toward your feet as you bend at the hip. Try to touch your nose to your knees and feel the stretch in the back of your thighs. Hold for 30 seconds. Repeat __________ times. Complete this exercise __________ times a day. Calf stretch  Stand facing a wall. Place the palms of your hands flat against the wall, arms extended, and lean slightly against the wall. Get into a lunge position with one leg bent at the knee and the other leg stretched out straight behind you. Keep both feet facing the wall and increase the bend in your knee while keeping the heel of the other leg flat on the ground. You should feel the stretch in your calf. Hold for 30 seconds. Repeat __________ times. Complete this exercise __________ times a day. Strengthening exercises Straight leg lift Lie on your back with one knee bent and the other leg out straight. Slowly lift the straight leg without bending the knee. Lift until your foot is about 12 inches (30 cm) off the floor. Hold for 3-5 seconds and slowly lower your leg. Repeat __________ times. Complete this exercise __________ times a day. Single leg dip Stand between two chairs and put both hands on the backs of the chairs for support. Extend one leg out straight with your body weight resting on the heel of the standing leg. Slowly bend your standing knee to dip your body to the level that is comfortable for you. Hold for 3-5 seconds. Repeat __________ times. Complete this exercise __________ times a day. Hamstring curls Stand straight, knees close together, facing the back of a chair. Hold on to the back of a chair with both hands. Keep one leg straight. Bend the other knee while bringing the heel up toward the buttock until the knee is bent at a 90-degree angle (right angle). Hold for 3-5 seconds. Repeat __________ times.  Complete this exercise __________ times a day. Wall squat Stand straight with your back, hips, and head against a wall. Step forward one foot at a time with your back still against the wall. Your feet should be 2 feet (61 cm) from the wall at shoulder width. Keeping your back, hips, and head against the wall, slide down the wall to as close of a sitting position as you can get. Hold for 5-10 seconds, then slowly slide back up. Repeat __________ times. Complete this exercise __________ times a day. Step-ups Step up with one foot onto a sturdy platform or stool that is about 6 inches (15 cm) high. Face sideways with one foot on the platform and one on the ground. Place all your weight on   the platform foot and lift your body off the ground until your knee extends. Let your other leg hang free to the side. Hold for 3-5 seconds then slowly lower your weight down to the floor foot. Repeat __________ times. Complete this exercise __________ times a day. Contact a health care provider if: Your exercise causes pain. Your pain is worse after you exercise. Your pain prevents you from doing your exercises. This information is not intended to replace advice given to you by your health care provider. Make sure you discuss any questions you have with your health care provider. Document Revised: 03/23/2020 Document Reviewed: 11/15/2019 Elsevier Patient Education  2023 [employer]  Back Exercises The following exercises strengthen the muscles that help to support the trunk (torso) and back. They also help to keep the lower back flexible. Doing these exercises can help to prevent or lessen existing low back pain. If you have back pain or discomfort, try doing these exercises 2-3 times each day or as told by your health care provider. As your pain improves, do them once each day, but increase the number of times that you repeat the steps for each exercise (do more repetitions). To prevent the recurrence of  back pain, continue to do these exercises once each day or as told by your health care provider. Do exercises exactly as told by your health care provider and adjust them as directed. It is normal to feel mild stretching, pulling, tightness, or discomfort as you do these exercises, but you should stop right away if you feel sudden pain or your pain gets worse. Exercises Single knee to chest Repeat these steps 3-5 times for each leg: Lie on your back on a firm bed or the floor with your legs extended. Bring one knee to your chest. Your other leg should stay extended and in contact with the floor. Hold your knee in place by grabbing your knee or thigh with both hands and hold. Pull on your knee until you feel a gentle stretch in your lower back or buttocks. Hold the stretch for 10-30 seconds. Slowly release and straighten your leg.  Pelvic tilt Repeat these steps 5-10 times: Lie on your back on a firm bed or the floor with your legs extended. Bend your knees so they are pointing toward the ceiling and your feet are flat on the floor. Tighten your lower abdominal muscles to press your lower back against the floor. This motion will tilt your pelvis so your tailbone points up toward the ceiling instead of pointing to your feet or the floor. With gentle tension and even breathing, hold this position for 5-10 seconds.  Cat-cow Repeat these steps until your lower back becomes more flexible: Get into a hands-and-knees position on a firm bed or the floor. Keep your hands under your shoulders, and keep your knees under your hips. You may place padding under your knees for comfort. Let your head hang down toward your chest. Contract your abdominal muscles and point your tailbone toward the floor so your lower back becomes rounded like the back of a cat. Hold this position for 5 seconds. Slowly lift your head, let your abdominal muscles relax, and point your tailbone up toward the ceiling so your back  forms a sagging arch like the back of a cow. Hold this position for 5 seconds.  Press-ups Repeat these steps 5-10 times: Lie on your abdomen (face-down) on a firm bed or the floor. Place your palms near your head, about shoulder-width   apart. Keeping your back as relaxed as possible and keeping your hips on the floor, slowly straighten your arms to raise the top half of your body and lift your shoulders. Do not use your back muscles to raise your upper torso. You may adjust the placement of your hands to make yourself more comfortable. Hold this position for 5 seconds while you keep your back relaxed. Slowly return to lying flat on the floor.  Bridges Repeat these steps 10 times: Lie on your back on a firm bed or the floor. Bend your knees so they are pointing toward the ceiling and your feet are flat on the floor. Your arms should be flat at your sides, next to your body. Tighten your buttocks muscles and lift your buttocks off the floor until your waist is at almost the same height as your knees. You should feel the muscles working in your buttocks and the back of your thighs. If you do not feel these muscles, slide your feet 1-2 inches (2.5-5 cm) farther away from your buttocks. Hold this position for 3-5 seconds. Slowly lower your hips to the starting position, and allow your buttocks muscles to relax completely. If this exercise is too easy, try doing it with your arms crossed over your chest. Abdominal crunches Repeat these steps 5-10 times: Lie on your back on a firm bed or the floor with your legs extended. Bend your knees so they are pointing toward the ceiling and your feet are flat on the floor. Cross your arms over your chest. Tip your chin slightly toward your chest without bending your neck. Tighten your abdominal muscles and slowly raise your torso high enough to lift your shoulder blades a tiny bit off the floor. Avoid raising your torso higher than that because it can put too  much stress on your lower back and does not help to strengthen your abdominal muscles. Slowly return to your starting position.  Back lifts Repeat these steps 5-10 times: Lie on your abdomen (face-down) with your arms at your sides, and rest your forehead on the floor. Tighten the muscles in your legs and your buttocks. Slowly lift your chest off the floor while you keep your hips pressed to the floor. Keep the back of your head in line with the curve in your back. Your eyes should be looking at the floor. Hold this position for 3-5 seconds. Slowly return to your starting position.  Contact a health care provider if: Your back pain or discomfort gets much worse when you do an exercise. Your worsening back pain or discomfort does not lessen within 2 hours after you exercise. If you have any of these problems, stop doing these exercises right away. Do not do them again unless your health care provider says that you can. Get help right away if: You develop sudden, severe back pain. If this happens, stop doing the exercises right away. Do not do them again unless your health care provider says that you can. This information is not intended to replace advice given to you by your health care provider. Make sure you discuss any questions you have with your health care provider. Document Revised: 05/15/2021 Document Reviewed: 01/31/2021 Elsevier Patient Education  2023 [employer]  

## 2022-09-23 ENCOUNTER — Other Ambulatory Visit: Payer: Self-pay

## 2022-09-23 DIAGNOSIS — Z79899 Other long term (current) drug therapy: Secondary | ICD-10-CM

## 2022-09-23 DIAGNOSIS — M3501 Sicca syndrome with keratoconjunctivitis: Secondary | ICD-10-CM

## 2022-09-24 LAB — LIPID PANEL
Chol/HDL Ratio: 3.1 ratio (ref 0.0–4.4)
Cholesterol, Total: 189 mg/dL (ref 100–199)
HDL: 61 mg/dL (ref >39)
LDL Chol Calc (NIH): 112 mg/dL — ABNORMAL HIGH (ref 0–99)
Triglycerides: 89 mg/dL (ref 0–149)
VLDL Cholesterol Cal: 16 mg/dL (ref 5–40)

## 2022-09-24 LAB — MICROSCOPIC EXAMINATION: Casts: NONE SEEN /lpf

## 2022-09-24 LAB — URINALYSIS, ROUTINE W REFLEX MICROSCOPIC
Bilirubin, UA: NEGATIVE
Glucose, UA: NEGATIVE
Ketones, UA: NEGATIVE
Nitrite, UA: NEGATIVE
Protein,UA: NEGATIVE
Specific Gravity, UA: 1.016 (ref 1.005–1.030)
Urobilinogen, Ur: 0.2 mg/dL (ref 0.2–1.0)
pH, UA: 6.5 (ref 5.0–7.5)

## 2022-09-24 LAB — VITAMIN D 25 HYDROXY (VIT D DEFICIENCY, FRACTURES): Vit D, 25-Hydroxy: 36.9 ng/mL (ref 30.0–100.0)

## 2022-09-24 NOTE — Progress Notes (Signed)
UA is nonspecific with rare bacteria which is most likely contamination.

## 2022-09-25 LAB — CMP14+EGFR
ALT: 17 IU/L (ref 0–32)
AST: 20 IU/L (ref 0–40)
Albumin/Globulin Ratio: 1.5 (ref 1.2–2.2)
Albumin: 4.2 g/dL (ref 3.8–4.9)
Alkaline Phosphatase: 54 IU/L (ref 44–121)
BUN/Creatinine Ratio: 19 (ref 9–23)
BUN: 14 mg/dL (ref 6–24)
Bilirubin Total: <0.2 mg/dL (ref 0.0–1.2)
CO2: 23 mmol/L (ref 20–29)
Calcium: 9.7 mg/dL (ref 8.7–10.2)
Chloride: 97 mmol/L (ref 96–106)
Creatinine, Ser: 0.73 mg/dL (ref 0.57–1.00)
Globulin, Total: 2.8 g/dL (ref 1.5–4.5)
Glucose: 101 mg/dL — ABNORMAL HIGH (ref 70–99)
Potassium: 4.2 mmol/L (ref 3.5–5.2)
Sodium: 135 mmol/L (ref 134–144)
Total Protein: 7 g/dL (ref 6.0–8.5)
eGFR: 99 mL/min/{1.73_m2} (ref >59)

## 2022-09-25 NOTE — Progress Notes (Signed)
CMP is normal.

## 2022-09-26 ENCOUNTER — Encounter: Payer: Self-pay | Admitting: Family Medicine

## 2022-09-27 ENCOUNTER — Encounter: Payer: Self-pay | Admitting: *Deleted

## 2022-09-27 LAB — CBC WITH DIFFERENTIAL/PLATELET
Basophils Absolute: 0.1 10*3/uL (ref 0.0–0.2)
Basos: 1 %
EOS (ABSOLUTE): 0.2 10*3/uL (ref 0.0–0.4)
Eos: 4 %
Hematocrit: 37.1 % (ref 34.0–46.6)
Hemoglobin: 12.6 g/dL (ref 11.1–15.9)
Immature Grans (Abs): 0 10*3/uL (ref 0.0–0.1)
Immature Granulocytes: 0 %
Lymphocytes Absolute: 2.3 10*3/uL (ref 0.7–3.1)
Lymphs: 40 %
MCH: 31.4 pg (ref 26.6–33.0)
MCHC: 34 g/dL (ref 31.5–35.7)
MCV: 93 fL (ref 79–97)
Monocytes Absolute: 0.6 10*3/uL (ref 0.1–0.9)
Monocytes: 10 %
Neutrophils Absolute: 2.6 10*3/uL (ref 1.4–7.0)
Neutrophils: 45 %
Platelets: 280 10*3/uL (ref 150–450)
RBC: 4.01 x10E6/uL (ref 3.77–5.28)
RDW: 12.9 % (ref 11.7–15.4)
WBC: 5.8 10*3/uL (ref 3.4–10.8)

## 2022-09-27 LAB — PROTEIN ELECTROPHORESIS, SERUM, WITH REFLEX
A/G Ratio: 1.3 (ref 0.7–1.7)
Albumin ELP: 4 g/dL (ref 2.9–4.4)
Alpha 1: 0.2 g/dL (ref 0.0–0.4)
Alpha 2: 0.7 g/dL (ref 0.4–1.0)
Beta: 1 g/dL (ref 0.7–1.3)
Gamma Globulin: 1 g/dL (ref 0.4–1.8)
Globulin, Total: 3 g/dL (ref 2.2–3.9)
Total Protein: 7 g/dL (ref 6.0–8.5)

## 2022-09-27 LAB — SEDIMENTATION RATE: Sed Rate: 28 mm/hr (ref 0–40)

## 2022-09-30 ENCOUNTER — Ambulatory Visit: Payer: 59 | Admitting: Family Medicine

## 2022-09-30 VITALS — BP 120/66 | HR 75 | Temp 98.4°F | Ht 59.0 in | Wt 177.0 lb

## 2022-09-30 DIAGNOSIS — R635 Abnormal weight gain: Secondary | ICD-10-CM

## 2022-09-30 NOTE — Progress Notes (Signed)
   Subjective:    Patient ID: Brittney [name], female    DOB: May 31, 1971, 51 y.o.   MRN: [id number]  HPI Patient would like to discuss weight loss medication options Patient is interested in medications to help her lose weight She has had difficult time losing weight She is trying to eat healthy for the most part Occasionally has snacks Patient has tried restricting calories Works out 3-4 times every week Has cut out sugary foods Has difficult time losing weight Her BMI is within the category of morbid obesity She does have some underlying health issues which puts her at higher risk including hypertension  Review of Systems     Objective:   Physical Exam  General-in no acute distress Eyes-no discharge Lungs-respiratory rate normal, CTA CV-no murmurs,RRR Extremities skin warm dry no edema Neuro grossly normal Behavior normal, alert       Assessment & Plan:  Moderate weight gain Morbid obesity We did discuss portion control healthy eating Discussed water avoiding sugary drinks Minimizing snacks Finding either a friend to work with her or health coach Patient will check with her insurance to see if these medicines are covered or not she will let us know Based on guidelines she would qualify for [facility] is in short supply but we will prescribe it at low-dose if her insurance covers it and gradually increase per protocol

## 2022-09-30 NOTE — Patient Instructions (Signed)
IEPPIR and Saxenda  Discussion of GLP-1 medications Please remember these medications are to assist with weight reduction and diabetes control.  They do not take the place of healthy eating and regular physical activity. There are several GLP-1 medications that can help with weight reduction and diabetes control.  GLP-1 medications with indication for diabetes-Trulicity, Victoza, Bydureon , Mounjaro, Ozempic, and Rybelsus (although these are injected medicines except for Rybelsus which is a pill) GLP-1 medications with indications for weight loss-Wegovy, and Saxenda  Mechanism of action  These medications stimulate glucagon peptide receptors.  By doing so it does the following:  1-slows down stomach emptying-essentially food takes longer to go through the stomach and the intestines-this can lessen appetite  2-reduces glucagon secretion from the pancreas-this helps keep blood glucose levels stable between meals  3-increase his insulin release from the pancreas-with diabetics this helps keep blood glucose levels stable  4-promotes the feeling of being full in the brain-encouraged him receptors in the brain received the signal so the brain and body know that it is time to stop eating Benefits of the medication  1-reduced weight-reduction of weight is more significant at higher dosing.  Not as much weight reduction with Rybelsus   A- typically Wegovy at higher doses can assist with significant weight reduction.  2-improved blood glucose levels-with diabetics typically we see a significant drop with A1c when the medication is adjusted appropriately.  3-decrease risk of heart attacks and strokes-individuals with type 2 diabetes are at increased risk of heart attacks and strokes.  These GLP-1 medications can reduce the risk by 22%.  Risk of GLP-1 medications-these are some of the risks.  It is important to talk with your provider in a shared discussion before starting any  medication.  Contraindications to GLP-1 medications-in other words who should not take these.  1-individuals with history of thyroid medullary cancer  2-individuals with family history of multiple endocrine neoplasm syndrome type II (MEN 2)  3-individuals with a history of pancreatitis  4-those with a history of severe hypersensitivity or allergy reactions to GLP-1 medications  5-should be avoided in individuals with a history of suicidal attempts or active suicidal ideation.  Cautions include- 1- risk of thyroid C-cell tumors were seen in rats during clinical testing in humans the relevancy of this information has not been determined 2-risk of pancreatitis including fatal and nonfatal hemorrhagic or necrotizing pancreatitis 3-gallbladder disease slightly increased risk of gallstones 4-hypoglycemia-more common with individuals who are on insulin or sulfonylurea such as glipizide 5-acute kidney injury or worsening of chronic renal failure often this is triggered by severe vomiting and diarrhea as side effects to GLP-1. 6-slightly increased risk of diabetic retinopathy complications in patients with type 2 diabetes 7-heart rate increase-slight increase of base heart rate with GLP-1 8-suicidal behavior and ideation has been reported in clinical trials with other weight loss medicines.  If depression occurs with medication or suicidal ideation stop medication immediately notify provider or seek help.  Common side effects include nausea, vomiting, diarrhea, constipation and increased heart rate.  Less common side effects severe abdominal pain-if this occurs notify provider stop medication get tested for pancreatitis. Full discussion with your provider should occur before starting these medicines.

## 2022-09-30 NOTE — Telephone Encounter (Signed)
Nurses I did have a consultation with Brittney [name] today regarding weight loss medicines She does not meet the criteria We can go ahead with Wegovy 0.25 Once weekly 1 month supply with 6 refills  It would be fine to go ahead and send in this prescription but it is also important to relate to Brittney [name] that local pharmacist recently stated that [facility] due to high usage is on backorder that is estimated to possibly take up to 6 months before the company can produce enough  This is because of a production shortage.  Unfortunately we cannot control this aspect  If Brittney [name] would like to call around to different pharmacies perhaps even trying [facility] or [facility] etc. if she finds it would go be present at a different pharmacy and let us know we will be happy to forward there  Unfortunately we cannot have any control over production shortages and unfortunately we cannot call around to find a pharmacy or a pharmacy that does have the [place] can do this but we do not have the staffing to try to find available medicines when they are in short supply  Thanks Dr. Nicki Reaper

## 2022-10-01 MED ORDER — SAXENDA 18 MG/3ML ~~LOC~~ SOPN: PEN_INJECTOR | SUBCUTANEOUS | 0 refills | Status: DC

## 2022-10-01 NOTE — Telephone Encounter (Signed)
Saxenda 0.6 mg 1 subcutaneous Instructions 0.6 mg 1 subcutaneous daily for 7 days then 1.2 mg subcutaneous daily for 7 days then 1.8 mg subcutaneous daily for 7 days then 2.4 mg subcutaneous daily  Essentially started off at the lower dose gradually increase each week as tolerated 1 month supply at the end of 1 month we will need to give [place] update how things are going then we can increase to 3 mg at that point Patient needs to give [place] update within  3 weeks how she is doing Follow-up office visit 3 months to check weight loss progression

## 2022-10-04 ENCOUNTER — Telehealth: Payer: Self-pay | Admitting: *Deleted

## 2022-10-04 NOTE — Telephone Encounter (Signed)
PA for Saxenda denied by insurance. Insurance denied request because the plan will only cover medication if there is documentation of a supervised comprehensive weight loss management program for at least 6 months before starting this medication.

## 2022-10-06 NOTE — Telephone Encounter (Signed)
Although these medications certainly come with their twist and turns  Please advise the patient of this stipulation that is being put forth by the insurance company. I would suggest that the patient does her best on a calorie restrictive diet either following the advice of a dietitian, or online applications such as weight watchers I also advised that she continue exercising And if she wants to show that she is trying to adhere to this stipulation to do a follow-up visit in 3 months and 6 months And at the 78-monthfollow-up we can implement another [facility]  If she would like to have a dietary consult please do that as well thank you

## 2022-10-07 NOTE — Telephone Encounter (Signed)
Patient notified of providers recommendation and requirements of insurance for Saxenda. Patient verbalized understanding and stated she did not wish to proceed with the medication or weight loss plan at this time

## 2022-10-07 NOTE — Telephone Encounter (Signed)
See phone message (10/07/22) - Discussed with patient

## 2022-10-13 ENCOUNTER — Encounter (INDEPENDENT_AMBULATORY_CARE_PROVIDER_SITE_OTHER): Payer: Self-pay | Admitting: Gastroenterology

## 2022-11-05 ENCOUNTER — Encounter: Payer: Self-pay | Admitting: Family Medicine

## 2022-11-05 ENCOUNTER — Ambulatory Visit: Payer: 59 | Admitting: Family Medicine

## 2022-11-05 VITALS — BP 124/75 | HR 66 | Temp 98.4°F | Wt 179.4 lb

## 2022-11-05 DIAGNOSIS — R1030 Lower abdominal pain, unspecified: Secondary | ICD-10-CM

## 2022-11-05 LAB — POCT URINALYSIS DIP (CLINITEK)
Spec Grav, UA: 1.015 (ref 1.010–1.025)
pH, UA: 6 (ref 5.0–8.0)

## 2022-11-05 NOTE — Assessment & Plan Note (Signed)
Patient endorsing lower abdominal pain and low back pain.  Patient concerned about UTI.  Urinalysis with small amount of blood.  No leukocytes or nitrites.  Sending for culture.  Advised recheck of urine in the next few weeks to ensure resolution of noted hematuria.  If continues to persist will need referral to urology.  Supportive care.

## 2022-11-05 NOTE — Progress Notes (Signed)
Subjective:  Patient ID: Brittney [name], female    DOB: 12-02-1971  Age: 51 y.o. MRN: [id number]  CC: Chief Complaint  Patient presents with   Urinary Tract Infection    Lower abd pain; back pain. Urine fizzy last Friday and Saturday. Odor and dark. Overall feels tired. Symptoms began about one week ago. No OTC treatments.     HPI:  51 year old female presents for evaluation of the above.  Patient is concerned that she has urinary tract infection.  She states that she has had symptoms for the past 1.5 weeks.  Has had fatigue, intermittent lower abdominal pain, and intermittent low back pain.  She states that last week she had strong urine odor and also had "fizzy urine".  No frequency.  No dysuria.  No fever.  No flank pain/CVA pain.  She states that she has had some vaginal irritation.  No discharge.  No other complaints or concerns at this time.  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   Sjogren's syndrome with keratoconjunctivitis sicca ([place]) 11/04/2018   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   Essential hypertension, benign 07/12/2014   IBS (irritable bowel syndrome) 06/22/2009    Social Hx   Social History   Socioeconomic History   Marital status: Married    Spouse name: Not on file   Number of children: 2   Years of education: Not on file   Highest education level: Not on file  Occupational History   Occupation: patient advocate    Employer: [employer]  Tobacco Use   Smoking status: Never    Passive exposure: Current (very minimal)   Smokeless tobacco: Never  Vaping Use   Vaping Use: Never used  Substance and Sexual Activity   Alcohol use: Yes    Comment: occassionally    Drug use: Never   Sexual activity: Not on file  Other Topics Concern    Not on file  Social History Narrative   Married 14 years, 2 children ages 29 and 54.    Job: Pt accounting    Gets regular exercise.    Right Handed   Drinks Caffeine   Lives in a one story home    Social Determinants of Health   Financial Resource Strain: Not on file  Food Insecurity: Not on file  Transportation Needs: Not on file  Physical Activity: Not on file  Stress: Not on file  Social Connections: Not on file    Review of Systems Per HPI  Objective:  BP 124/75   Pulse 66   Temp 98.4 F (36.9 C)   Wt 179 lb 6.4 oz (81.4 kg)   SpO2 99%   BMI 36.23 kg/m      11/05/2022   10:59 AM 09/30/2022    1:36 PM 09/20/2022    9:57 AM  BP/Weight  Systolic BP 245 809 983  Diastolic BP 75 66 88  Wt. (Lbs) 179.4 177 178  BMI 36.23 kg/m2 35.75 kg/m2 35.95 kg/m2    Physical Exam Vitals and nursing note reviewed.  Constitutional:      General: She is not in acute distress.    Appearance: Normal appearance. She is not ill-appearing.  HENT:     Head: Normocephalic and atraumatic.  Eyes:     General:  Right eye: No discharge.        Left eye: No discharge.     Conjunctiva/sclera: Conjunctivae normal.  Cardiovascular:     Rate and Rhythm: Normal rate and regular rhythm.  Pulmonary:     Effort: Pulmonary effort is normal.     Breath sounds: Normal breath sounds.  Abdominal:     General: There is no distension.     Palpations: Abdomen is soft.     Tenderness: There is no abdominal tenderness.  Neurological:     Mental Status: She is alert.  Psychiatric:        Mood and Affect: Mood normal.        Behavior: Behavior normal.     Lab Results  Component Value Date   WBC 5.8 09/23/2022   HGB 12.6 09/23/2022   HCT 37.1 09/23/2022   PLT 280 09/23/2022   GLUCOSE 101 (H) 09/23/2022   CHOL 189 09/23/2022   TRIG 89 09/23/2022   HDL 61 09/23/2022   LDLCALC 112 (H) 09/23/2022   ALT 17 09/23/2022   AST 20 09/23/2022   NA 135 09/23/2022   K 4.2 09/23/2022   CL  97 09/23/2022   CREATININE 0.73 09/23/2022   BUN 14 09/23/2022   CO2 23 09/23/2022   TSH 1.14 07/03/2020   HGBA1C 5.6 10/28/2018     Assessment & Plan:   Problem List Items Addressed This Visit       Other   Lower abdominal pain - Primary    Patient endorsing lower abdominal pain and low back pain.  Patient concerned about UTI.  Urinalysis with small amount of blood.  No leukocytes or nitrites.  Sending for culture.  Advised recheck of urine in the next few weeks to ensure resolution of noted hematuria.  If continues to persist will need referral to urology.  Supportive care.      Relevant Orders   POCT URINALYSIS DIP (CLINITEK) (Completed)   Urine Culture   Follow-up:  See above  Ragan

## 2022-11-05 NOTE — Patient Instructions (Signed)
Everything looks okay.  Recommend recheck urine in the next couple of weeks.  Increase fluid intake.  If symptoms continue to persist or worsen, please let us know.  Take care  Dr. Lacinda Axon

## 2022-11-07 LAB — URINE CULTURE

## 2022-11-26 IMAGING — DX DG CHEST 2V
2 series · 2 of 2 positions shown · non-contrast
Comparison: 07/03/2021

CLINICAL DATA: Chest pain and shortness of breath for 2 days.

EXAM:
CHEST - 2 VIEW

[chest pa]
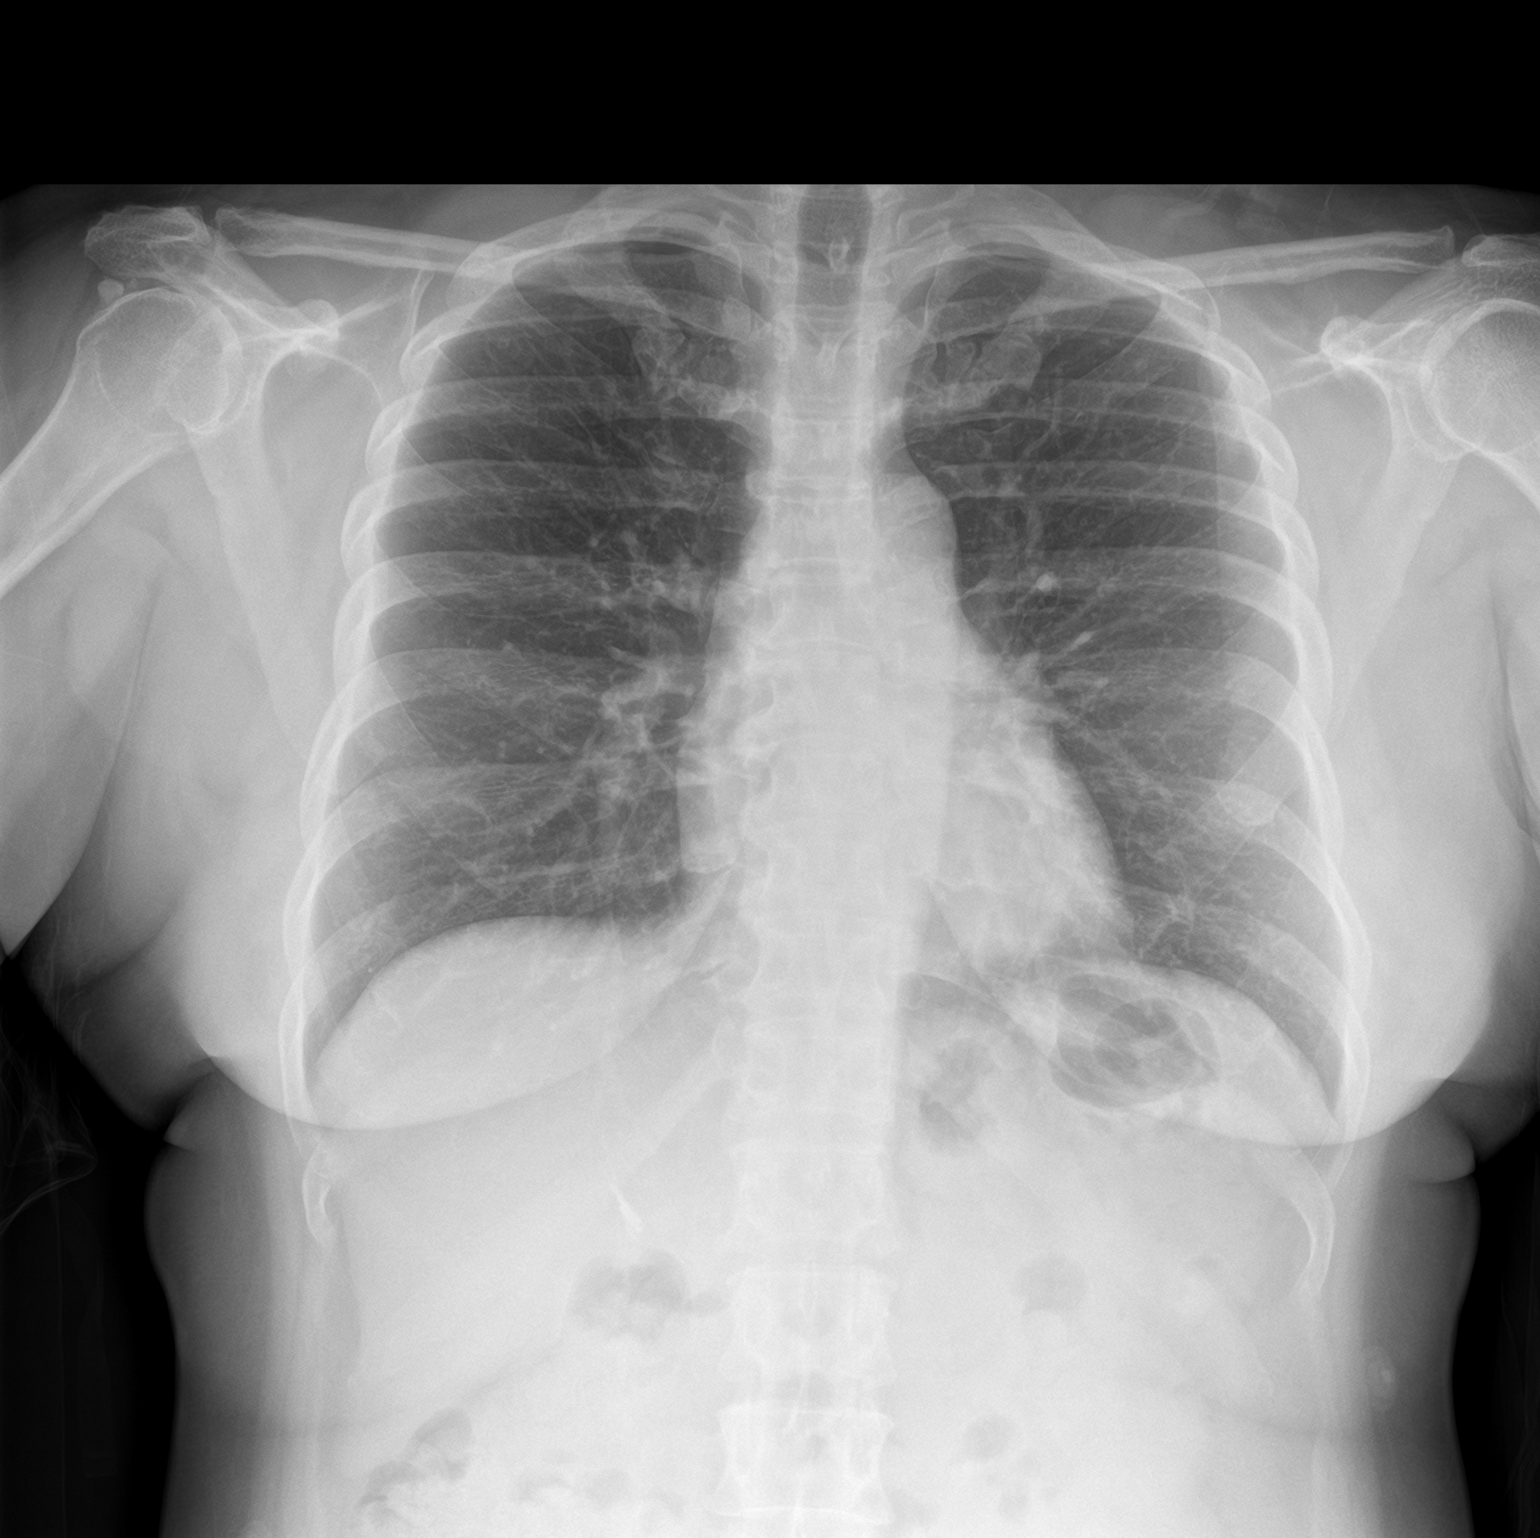

[chest lat]
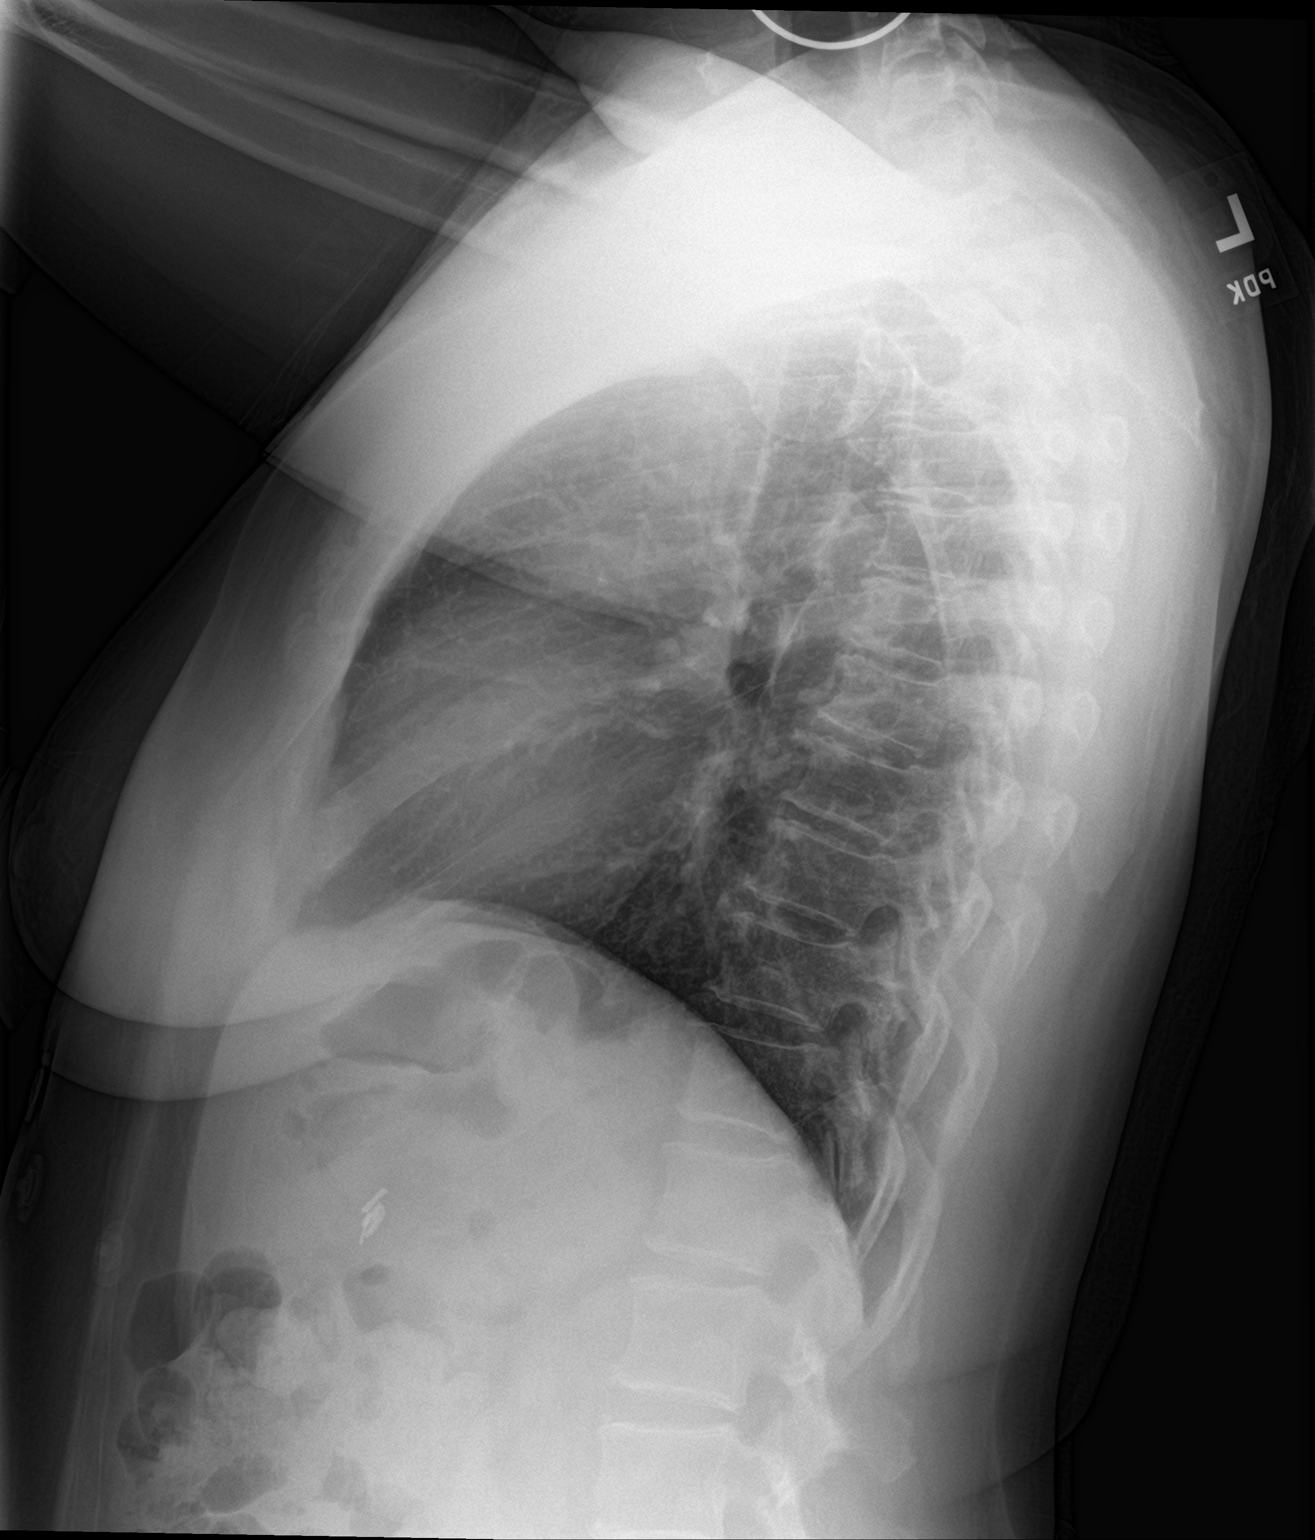

[2 of 2 positions shown; findings below may reference images not displayed]

FINDINGS: Findings the cardiac silhouette, mediastinal and hilar contours are
within normal limits. And lungs are clear. No pleural effusions. No
pulmonary nodules.
IMPRESSION: No acute cardiopulmonary findings.

## 2022-12-13 ENCOUNTER — Ambulatory Visit: Payer: 59 | Admitting: Neurology

## 2022-12-18 ENCOUNTER — Other Ambulatory Visit: Payer: Self-pay | Admitting: Rheumatology

## 2022-12-18 NOTE — Telephone Encounter (Signed)
Next Visit: 03/21/2023  Last Visit: 09/20/2022  Last Fill: 09/20/2022  Dx: Sjogren's syndrome with keratoconjunctivitis sicca   Current Dose per office note on 09/20/2022: pilocarpine 5 mg p.o. 3 times daily as needed.   Okay to refill Pilocarpine?

## 2023-01-04 ENCOUNTER — Other Ambulatory Visit: Payer: Self-pay | Admitting: Family Medicine

## 2023-02-08 ENCOUNTER — Other Ambulatory Visit: Payer: Self-pay | Admitting: Family Medicine

## 2023-02-11 ENCOUNTER — Ambulatory Visit: Payer: 59 | Admitting: Neurology

## 2023-02-19 ENCOUNTER — Encounter: Payer: Self-pay | Admitting: Family Medicine

## 2023-02-19 DIAGNOSIS — E785 Hyperlipidemia, unspecified: Secondary | ICD-10-CM

## 2023-02-19 DIAGNOSIS — I10 Essential (primary) hypertension: Secondary | ICD-10-CM

## 2023-02-19 NOTE — Telephone Encounter (Signed)
No labs scheduled for [facility]. Please advise.

## 2023-02-19 NOTE — Telephone Encounter (Signed)
She does need metabolic 7, lipid, urine ACR Diagnosis hyperlipidemia, HTN

## 2023-02-23 LAB — MICROALBUMIN / CREATININE URINE RATIO
Creatinine, Urine: 25.3 mg/dL
Microalb/Creat Ratio: <12 mg/g creat (ref 0–29)
Microalbumin, Urine: <3 ug/mL

## 2023-02-23 LAB — BASIC METABOLIC PANEL (7)
BUN/Creatinine Ratio: 12 (ref 9–23)
BUN: 9 mg/dL (ref 6–24)
CO2: 26 mmol/L (ref 20–29)
Chloride: 98 mmol/L (ref 96–106)
Creatinine, Ser: 0.75 mg/dL (ref 0.57–1.00)
Glucose: 86 mg/dL (ref 70–99)
Potassium: 3.9 mmol/L (ref 3.5–5.2)
Sodium: 140 mmol/L (ref 134–144)
eGFR: 96 mL/min/{1.73_m2} (ref >59)

## 2023-02-23 LAB — LIPID PANEL
Chol/HDL Ratio: 3.1 ratio (ref 0.0–4.4)
Cholesterol, Total: 193 mg/dL (ref 100–199)
HDL: 62 mg/dL (ref >39)
LDL Chol Calc (NIH): 114 mg/dL — ABNORMAL HIGH (ref 0–99)
Triglycerides: 95 mg/dL (ref 0–149)
VLDL Cholesterol Cal: 17 mg/dL (ref 5–40)

## 2023-02-24 ENCOUNTER — Ambulatory Visit: Payer: 59 | Admitting: Family Medicine

## 2023-02-24 VITALS — BP 126/82 | HR 80 | Ht 59.0 in | Wt 175.6 lb

## 2023-02-24 DIAGNOSIS — M3501 Sicca syndrome with keratoconjunctivitis: Secondary | ICD-10-CM

## 2023-02-24 DIAGNOSIS — G600 Hereditary motor and sensory neuropathy: Secondary | ICD-10-CM

## 2023-02-24 DIAGNOSIS — I10 Essential (primary) hypertension: Secondary | ICD-10-CM | POA: Diagnosis not present

## 2023-02-24 DIAGNOSIS — E785 Hyperlipidemia, unspecified: Secondary | ICD-10-CM

## 2023-02-24 NOTE — Patient Instructions (Signed)

## 2023-02-24 NOTE — Progress Notes (Signed)
   Subjective:    Patient ID: Ronda Lubarsky, female    DOB: 12-27-1970, 52 y.o.   MRN: [id number]  HPI Patient arrives today for 6 month follow up for lab work. Results for orders placed or performed in visit on [id number]  Basic Metabolic Panel (7)  Result Value Ref Range   Glucose 86 70 - 99 mg/dL   BUN 9 6 - 24 mg/dL   Creatinine, Ser 0.75 0.57 - 1.00 mg/dL   eGFR 96 >59 mL/min/1.73   BUN/Creatinine Ratio 12 9 - 23   Sodium 140 134 - 144 mmol/L   Potassium 3.9 3.5 - 5.2 mmol/L   Chloride 98 96 - 106 mmol/L   CO2 26 20 - 29 mmol/L  Lipid panel  Result Value Ref Range   Cholesterol, Total 193 100 - 199 mg/dL   Triglycerides 95 0 - 149 mg/dL   HDL 62 >39 mg/dL   VLDL Cholesterol Cal 17 5 - 40 mg/dL   LDL Chol Calc (NIH) 114 (H) 0 - 99 mg/dL   Chol/HDL Ratio 3.1 0.0 - 4.4 ratio  Microalbumin/Creatinine Ratio, Urine  Result Value Ref Range   Creatinine, Urine 25.3 Not Estab. mg/dL   Microalbumin, Urine <3.0 Not Estab. ug/mL   Microalb/Creat Ratio <12 0 - 29 mg/g creat   Kidney function looks good LDL slightly elevated but HDL very good Urine micro protein looks good Hyperlipidemia, unspecified hyperlipidemia type  Primary hypertension  Morbid obesity (HCC)  CMT (Charcot-Marie-Tooth disease)  Sjogren's syndrome with keratoconjunctivitis sicca ([place]), Chronic Patient working on trying to do better with healthy eating She is also fitting and exercise She is seeing a weight loss center that has her on semaglutide  CMT- hands clumsy Legs burn and numb at night Fatigue with activity  Using magnesium spray and tylenol and compound for the legs Concern with compounded Ozempic she gets at the weight loss clinic.  Review of Systems     Objective:   Physical Exam  The 10-year ASCVD risk score (Arnett DK, et al., 2019) is: 3.1%   Values used to calculate the score:     Age: 27 years     Sex: Female     Is Non-Hispanic African American: Yes     Diabetic: No     Tobacco  smoker: No     Systolic Blood Pressure: 123XX123 mmHg     Is BP treated: Yes     HDL Cholesterol: 62 mg/dL     Total Cholesterol: 193 mg/dL  General-in no acute distress Eyes-no discharge Lungs-respiratory rate normal, CTA CV-no murmurs,RRR Extremities skin warm dry no edema Neuro grossly normal Behavior normal, alert      Assessment & Plan:  1. Hyperlipidemia, unspecified hyperlipidemia type Healthy diet regular activity no need for statin currently  2. Primary hypertension Blood pressure decent control minimize salt stay physically active continue exercise  3. Morbid obesity ([place]) Portion control Patient is seeing a weight loss clinic that is doing compounded Ozempic but is not brand-name She is aware that we cannot verify the medication that she is on This is something she is doing under her own free will  4. CMT (Charcot-Marie-Tooth disease) Causes a lot of numbness in the lower legs.  Also hand weakness and discomfort She follows with neurology on a yearly basis   5. Sjogren's syndrome with keratoconjunctivitis sicca ([place]) She needs to see rheumatology on a yearly basis  Follow-up here 6 months

## 2023-03-14 NOTE — Progress Notes (Deleted)
Office Visit Note  Patient: Brittney [name]             Date of Birth: 04-30-1971           MRN: [id number]             PCP: Babs Sciara, MD Referring: Babs Sciara, MD Visit Date: 03/28/2023 Occupation: @  Subjective:  No chief complaint on file.   History of Present Illness: Brittney [name] is a 52 y.o. female ***     Activities of Daily Living:  Patient reports morning stiffness for *** {minute/hour:19697}.   Patient {ACTIONS;DENIES/REPORTS:21021675::"Denies"} nocturnal pain.  Difficulty dressing/grooming: {ACTIONS;DENIES/REPORTS:21021675::"Denies"} Difficulty climbing stairs: {ACTIONS;DENIES/REPORTS:21021675::"Denies"} Difficulty getting out of chair: {ACTIONS;DENIES/REPORTS:21021675::"Denies"} Difficulty using hands for taps, buttons, cutlery, and/or writing: {ACTIONS;DENIES/REPORTS:21021675::"Denies"}  No Rheumatology ROS completed.   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 Not treated for it now.   IBS (irritable bowel syndrome)    dx in 2005 by primary MD   IBS (irritable bowel syndrome)    Obesity    Seasonal allergies    yr roud; was immunothrapy in childhood, does not need any more at this time   Sjogren's syndrome (HCC)     Family History  Problem Relation Age of Onset   Kidney failure Mother    Cancer Father 68       colon cancer   Healthy Sister     Healthy Brother    Cancer Brother    Healthy Sister    Eczema Daughter    Allergies Daughter    Eczema Son    Allergies Son    Past Surgical History:  Procedure Laterality Date   CESAREAN SECTION W/BTL  2007   cholectystectomy  2000   CHOLECYSTECTOMY  2000   COLONOSCOPY N/A 02/19/2013   Procedure: COLONOSCOPY;  Surgeon: Malissa Hippo, MD;  [facility];  Service: Endoscopy;  Laterality: N/A;  830   COLONOSCOPY WITH PROPOFOL N/A 10/31/2020   Procedure: COLONOSCOPY WITH PROPOFOL;  Surgeon: Malissa Hippo, MD;  Location: [facility];  Service: Gastroenterology;  Laterality: N/A;  1130   POLYPECTOMY  10/31/2020   Procedure: POLYPECTOMY;  Surgeon: Malissa Hippo, MD;  Location: [facility];  Service: Gastroenterology;;   Social History   Social History Narrative   Married 14 years, 2 children ages 43 and 72.    Job: Pt accounting    Gets regular exercise.    Right Handed   Drinks Caffeine   Lives in a one story home    Immunization History  Administered Date(s) Administered   Influenza-Unspecified 09/17/2017, 10/26/2019, 09/20/2022   Moderna Sars-Covid-2 Vaccination 01/26/2020, 02/23/2020     Objective: Vital Signs: There were no vitals taken for this visit.   Physical Exam   Musculoskeletal Exam: ***  CDAI Exam: CDAI Score: -- Patient Global: --; Provider Global: -- Swollen: --; Tender: -- Joint Exam 03/28/2023  No joint exam has been documented for this visit   There is currently no information documented on the homunculus. Go to the Rheumatology activity and complete the homunculus joint exam.  Investigation: No additional findings.  Imaging: No results found.  Recent Labs: Lab Results  Component Value Date   WBC 5.8 09/23/2022   HGB 12.6 09/23/2022   PLT 280 09/23/2022   NA 140 02/20/2023   K 3.9 02/20/2023   CL 98 02/20/2023   CO2 26 02/20/2023   GLUCOSE 86 02/20/2023   BUN 9 02/20/2023   CREATININE 0.75 02/20/2023   BILITOT  <0.2 09/23/2022   ALKPHOS 54 09/23/2022   AST 20 09/23/2022   ALT 17 09/23/2022   PROT 7.0 09/23/2022   ALBUMIN 4.2 09/23/2022   CALCIUM 9.7 09/23/2022   GFRAA 120 07/05/2020    Speciality Comments: No specialty comments available.  Procedures:  No procedures performed Allergies: Miralax [polyethylene glycol] and Vasotec [enalapril]   Assessment / Plan:     Visit Diagnoses: Sjogren's syndrome with keratoconjunctivitis sicca  High risk medication use  Primary osteoarthritis of both hands  Chondromalacia patellae, right knee  Chondromalacia, patella, left  Hypermobility arthralgia  Charcot-Marie-Tooth disease type 1A  Essential hypertension, benign  History of IBS  History of gastroesophageal reflux (GERD)  Ulnar neuropathy of both upper extremities  Orders: No orders of the defined types were placed in this encounter.  No orders of the defined types were placed in this encounter.   Face-to-face time spent with patient was *** minutes. Greater than 50% of time was spent in counseling and coordination of care.  Follow-Up Instructions: No follow-ups on file.   Gearldine Bienenstock, PA-C  Note - This record has been created using Dragon software.  Chart creation errors have been sought, but may not always  have been located. Such creation errors do not reflect on  the standard of medical care.

## 2023-03-21 ENCOUNTER — Ambulatory Visit: Payer: 59 | Admitting: Physician Assistant

## 2023-03-28 ENCOUNTER — Ambulatory Visit: Payer: 59 | Admitting: Physician Assistant

## 2023-03-28 DIAGNOSIS — M3501 Sicca syndrome with keratoconjunctivitis: Secondary | ICD-10-CM

## 2023-03-28 DIAGNOSIS — M2241 Chondromalacia patellae, right knee: Secondary | ICD-10-CM

## 2023-03-28 DIAGNOSIS — M2550 Pain in unspecified joint: Secondary | ICD-10-CM

## 2023-03-28 DIAGNOSIS — M2242 Chondromalacia patellae, left knee: Secondary | ICD-10-CM

## 2023-03-28 DIAGNOSIS — Z8719 Personal history of other diseases of the digestive system: Secondary | ICD-10-CM

## 2023-03-28 DIAGNOSIS — Z79899 Other long term (current) drug therapy: Secondary | ICD-10-CM

## 2023-03-28 DIAGNOSIS — G5623 Lesion of ulnar nerve, bilateral upper limbs: Secondary | ICD-10-CM

## 2023-03-28 DIAGNOSIS — G600 Hereditary motor and sensory neuropathy: Secondary | ICD-10-CM

## 2023-03-28 DIAGNOSIS — M545 Low back pain, unspecified: Secondary | ICD-10-CM

## 2023-03-28 DIAGNOSIS — I10 Essential (primary) hypertension: Secondary | ICD-10-CM

## 2023-03-28 DIAGNOSIS — M19041 Primary osteoarthritis, right hand: Secondary | ICD-10-CM

## 2023-04-10 ENCOUNTER — Encounter: Payer: Self-pay | Admitting: Neurology

## 2023-06-10 ENCOUNTER — Encounter: Payer: Self-pay | Admitting: Family Medicine

## 2023-06-11 MED ORDER — ONDANSETRON HCL 8 MG PO TABS: ORAL_TABLET | ORAL | 2 refills | Status: AC

## 2023-06-11 NOTE — Telephone Encounter (Signed)
Nurses May have Zofran 8 mg 1 tablet taken 3 times daily as needed, #18, 2 refills  Hope your trip goes well but it is extremely important to wash her hands before eating and drinking, and also avoid touching the face.  If possible make sure that all water sources are safe that she uses for drinking brushing teeth etc.  Cooked foods are typically less likely to harbor viruses versus raw foods  Hope your trip goes great

## 2023-06-12 ENCOUNTER — Ambulatory Visit (INDEPENDENT_AMBULATORY_CARE_PROVIDER_SITE_OTHER): Payer: 59 | Admitting: Gastroenterology

## 2023-06-12 ENCOUNTER — Encounter (INDEPENDENT_AMBULATORY_CARE_PROVIDER_SITE_OTHER): Payer: Self-pay | Admitting: Gastroenterology

## 2023-06-12 VITALS — BP 131/76 | HR 72 | Temp 97.6°F | Ht 59.0 in | Wt 176.7 lb

## 2023-06-12 DIAGNOSIS — R151 Fecal smearing: Secondary | ICD-10-CM | POA: Diagnosis not present

## 2023-06-12 DIAGNOSIS — K5909 Other constipation: Secondary | ICD-10-CM | POA: Diagnosis not present

## 2023-06-12 NOTE — Progress Notes (Addendum)
Referring Provider: Babs Sciara, MD Primary Care Physician:  Babs Sciara, MD Primary GI Physician: Dr. Levon Hedger   Chief Complaint  Patient presents with   Constipation    Follow up on constipation.    HPI:   Brittney [name] is a 52 y.o. female with past medical history of GERD, DM, HTN, IBS   Patient presenting today for follow up of constipation  Last seen July 2023, virtually. At that time, constipation well managed on Linzess.  Taking a laxative as needed maybe 3 times per month.  Having a bowel movement 4-5 times a week.  Water intake is good.  Did endorse occasional nausea and bloating when taking a laxative.  Recommended to continue with Linzess 145 mcg daily, good water intake and diet high in fruits veggies and whole grains.  Present:  She reports she was on semiglutide which worsened her constipation, she had to double up on her linzess to help. She has since been taken off of semiglutide. She reports she is not currently on linzess as she ran out about 3 weeks ago. She had some abdominal bloating and notes that she is having some issues getting herself clean after BMs. She started an otc bowel cleanse and Bloating improved once she was able to move her bowels. She notes having harder stools again as she stopped bowel cleanse. She has some occasional rectal bleeding. She notes history of hemorrhoids in the past, some rectal irritation at times. Denies abdominal pain. She has some chronic nausea but no vomiting. She notes nausea is intermittent and is usually very mild. She denies weight loss or changes in appetite, she has no GERD symptoms recently.   She denies any fecal incontinence though she is having urinary urgency and incontinence. 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    Last colonoscopy 10/31/20: - Three small polyps at the hepatic flexure, removed with a cold snare. Resected and retrieved. - Two small polyps at the hepatic flexure.  Biopsied.One is hyperplastic polyp.  2 polyps are tubular adenomas and 2 polyps are sessile serrated polyps. - Anal papilla(e) were hypertrophied. - Small lipoma at the anus.  Recommendations:  Repeat colonoscopy in nov 2026  Past Medical History:  Diagnosis Date   Acid reflux    Chest pain    intermittent x 1 yr   Diabetic mellitus, gestational    Hypertension    during pregnancy. Not treated for it now.   IBS (irritable bowel syndrome)    dx in 2005 by primary MD   IBS (irritable bowel syndrome)    Obesity    Seasonal allergies    yr roud; was immunothrapy in childhood, does not need any more at this time   Sjogren's syndrome [facility])     Past Surgical History:  Procedure Laterality Date   CESAREAN SECTION W/BTL  2007   cholectystectomy  2000   CHOLECYSTECTOMY  2000   COLONOSCOPY N/A 02/19/2013   Procedure: COLONOSCOPY;  Surgeon: Malissa Hippo, MD;  [facility];  Service: Endoscopy;  Laterality: N/A;  830   COLONOSCOPY WITH PROPOFOL N/A 10/31/2020   Procedure: COLONOSCOPY WITH PROPOFOL;  Surgeon: Malissa Hippo, MD;  [facility];  Service: Gastroenterology;  Laterality: N/A;  1130   POLYPECTOMY  10/31/2020   Procedure: POLYPECTOMY;  Surgeon: Malissa Hippo, MD;  [facility];  Service: Gastroenterology;;    Current Outpatient Medications  Medication Sig Dispense Refill   Cholecalciferol (  VITAMIN D) 125 MCG (5000 UT) CAPS Take 5,000 Units by mouth daily.      Collagen 500 MG CAPS Take by mouth daily. Bone Broth  Scoop = 15 grams     linaclotide (LINZESS) 145 MCG CAPS capsule Take 1 capsule (145 mcg total) by mouth daily before breakfast. 90 capsule 3   losartan (COZAAR) 100 MG tablet TAKE 1 TABLET BY MOUTH EVERY DAY 90 tablet 1   MAGNESIUM PO Take 400 mg by mouth daily.      meclizine (ANTIVERT) 25 MG tablet Take 1 tablet (25 mg total) by mouth 3 (three) times daily as needed for dizziness or nausea. 30 tablet 1   Multiple  Vitamins-Minerals (MULTIVITAMIN PO) Take 1 tablet by mouth daily. Alive for Woman 50 +     Omega-3 Fatty Acids (FISH OIL PO) Take 400 mg by mouth daily.     ondansetron (ZOFRAN) 8 MG tablet 1 tablet taken 3 times daily as needed 18 tablet 2   pilocarpine (SALAGEN) 5 MG tablet TAKE 1 TABLET BY MOUTH 3 TIMES DAILY AS NEEDED. 270 tablet 0   TURMERIC PO Take 375 mg by mouth daily.      UNABLE TO FIND Take 10 mg by mouth as needed. CBD gummie     No current facility-administered medications for this visit.    Allergies as of 06/12/2023 - Review Complete 02/24/2023  Allergen Reaction Noted   Miralax [polyethylene glycol] Swelling 02/06/2013   Vasotec [enalapril]  05/05/2015    Family History  Problem Relation Age of Onset   Kidney failure Mother    Cancer Father 80       colon cancer   Healthy Sister    Healthy Brother    Cancer Brother    Healthy Sister    Eczema Daughter    Allergies Daughter    Eczema Son    Allergies Son     Social History   Socioeconomic History   Marital status: Married    Spouse name: Not on file   Number of children: 2   Years of education: Not on file   Highest education level: Not on file  Occupational History   Occupation: patient advocate    Employer: [employer]  Tobacco Use   Smoking status: Never    Passive exposure: Current (very minimal)   Smokeless tobacco: Never  Vaping Use   Vaping status: Never Used  Substance and Sexual Activity   Alcohol use: Yes    Comment: occassionally    Drug use: Never   Sexual activity: Not on file  Other Topics Concern   Not on file  Social History Narrative   Married 14 years, 2 children ages 48 and 81.    Job: Pt accounting    Gets regular exercise.    Right Handed   Drinks Caffeine   Lives in a one story home    Social Determinants of Health   Financial Resource Strain: Not on file  Food Insecurity: Not on file  Transportation Needs: Not on file  Physical Activity: Not on file  Stress: Not  on file  Social Connections: Not on file    Review of systems General: negative for malaise, night sweats, fever, chills, weight loss Neck: Negative for lumps, goiter, pain and significant neck swelling Resp: Negative for cough, wheezing, dyspnea at rest CV: Negative for chest pain, leg swelling, palpitations, orthopnea GI: denies melena, hematochezia, nausea, vomiting, diarrhea, dysphagia, odyonophagia, early satiety or unintentional weight loss. +constipation +fecal soiling  MSK: Negative for joint pain or swelling, back pain, and muscle pain. Derm: Negative for itching or rash Psych: Denies depression, anxiety, memory loss, confusion. No homicidal or suicidal ideation.  Heme: Negative for prolonged bleeding, bruising easily, and swollen nodes. Endocrine: Negative for cold or heat intolerance, polyuria, polydipsia and goiter. Neuro: negative for tremor, gait imbalance, syncope and seizures. The remainder of the review of systems is noncontributory.  Physical Exam: There were no vitals taken for this visit. General:   Alert and oriented. No distress noted. Pleasant and cooperative.  Head:  Normocephalic and atraumatic. Eyes:  Conjuctiva clear without scleral icterus. Mouth:  Oral mucosa pink and moist. Good dentition. No lesions. Heart: Normal rate and rhythm, s1 and s2 heart sounds present.  Lungs: Clear lung sounds in all lobes. Respirations equal and unlabored. Abdomen:  +BS, soft, non-tender and non-distended. No rebound or guarding. No HSM or masses noted. Rectal:  Derm: No palmar erythema or jaundice Msk:  Symmetrical without gross deformities. Normal posture. Extremities:  Without edema. Neurologic:  Alert and  oriented x4 Psych:  Alert and cooperative. Normal mood and affect.  Invalid input(s): "6 MONTHS"   ASSESSMENT: Brittney [name] is a 52 y.o. female presenting today for follow up of constipation with some fecal soiling  Patient with history of chronic constipation  maintained on Linzess 145 mcg daily, worsening constipation previously when she was on semaglutide which she has since stopped.  She ran out of her Linzess and is not currently taking anything for constipation.  She would like to get back on Linzess at this time however she is requesting to try the higher dose of 290 mcg daily, she was previously on 145 will give her samples of linzess  to see if she does on this, can send a prescription for the 290 dosing if she responds well.  Notably she reports some issues with fecal soiling feeling that she cannot get herself clean after having bowel movements, this began after recent constipation with harder stools and the need to strain to defecate.  She notes some occasional toilet tissue hematochezia and some rectal irritation.  Suspect this may be related to presence of hemorrhoids, however patient declined rectal exam today.  She has had fairly recent colonoscopy in 2021 as outlined above.  She does also note history of CMT which causes some neurologic issues for her, she has been having some urinary incontinence and is seeing her neurologist in October.  We discussed that fecal soiling could be secondary to her neuro disease, though she has no overt fecal incontinence, she should also discuss this as well with her neurologist at upcoming appointment.  For now would recommend avoiding straining, limiting toilet time and trying over-the-counter hemorrhoid cream as well as restarting Linzess as above.   PLAN:  Linzess samples  2.  Increase water intake, aim for atleast 64 oz per day Increase fruits, veggies and whole grains, kiwi and prunes are especially good for constipation 3. Discuss urinary incontinence/fecal soiling with neuro 4. Avoid straining, limit toilet time 5. Can try otc hemorrhoid wipes/cream   All questions were answered, patient verbalized understanding and is in agreement with plan as outlined above.   Follow Up: 1 year    Osie Merkin L. Jeanmarie Hubert, MSN, APRN, AGNP-C Adult-Gerontology Nurse Practitioner [facility]  I have reviewed the note and agree with the APP's assessment as described in this progress note  Katrinka Blazing, MD Gastroenterology and Hepatology [facility] Gastroenterology

## 2023-06-12 NOTE — Patient Instructions (Signed)
Increase water intake, aim for atleast 64 oz per day Increase fruits, veggies and whole grains, kiwi and prunes are especially good for constipation Please discuss urinary incontinence/fecal soiling with your neurologist at upcoming appointment, as discussed fecal soiling could be from hemorrhoids, I recommend we restart linzess, we can try samples of the higher dose, let me know how this works, you can try some otc hemorrhoid wipes/cream.  Please let me know if nausea is worsening or you have any new/associated symptoms  Follow up 1 year   It was a pleasure to see you today. I want to create trusting relationships with patients and provide genuine, compassionate, and quality care. I truly value your feedback! please be on the lookout for a survey regarding your visit with me today. I appreciate your input about our visit and your time in completing this!    Navie Lamoreaux L. Jeanmarie Hubert, MSN, APRN, AGNP-C Adult-Gerontology Nurse Practitioner [facility]astroenter[facility] at [facility]

## 2023-07-03 ENCOUNTER — Encounter (INDEPENDENT_AMBULATORY_CARE_PROVIDER_SITE_OTHER): Payer: Self-pay

## 2023-07-03 ENCOUNTER — Other Ambulatory Visit (INDEPENDENT_AMBULATORY_CARE_PROVIDER_SITE_OTHER): Payer: Self-pay | Admitting: *Deleted

## 2023-07-03 MED ORDER — LINACLOTIDE 290 MCG PO CAPS: 290.0000 ug | ORAL_CAPSULE | Freq: Every day | ORAL | 5 refills | Status: DC

## 2023-07-29 ENCOUNTER — Encounter: Payer: Self-pay | Admitting: Neurology

## 2023-08-07 ENCOUNTER — Other Ambulatory Visit (INDEPENDENT_AMBULATORY_CARE_PROVIDER_SITE_OTHER): Payer: Self-pay | Admitting: Gastroenterology

## 2023-08-27 ENCOUNTER — Ambulatory Visit: Payer: 59 | Admitting: Family Medicine

## 2023-08-29 NOTE — Progress Notes (Deleted)
Office Visit Note  Patient: Brittney [name]             Date of Birth: 1971/08/19           MRN: [id number]             PCP: Babs Sciara, MD Referring: Babs Sciara, MD Visit Date: 09/12/2023 Occupation: @GUAROCC @  Subjective:  No chief complaint on file.   History of Present Illness: Brittney [name] is a 52 y.o. female ***     Activities of Daily Living:  Patient reports morning stiffness for *** {minute/hour:19697}.   Patient {ACTIONS;DENIES/REPORTS:21021675::"Denies"} nocturnal pain.  Difficulty dressing/grooming: {ACTIONS;DENIES/REPORTS:21021675::"Denies"} Difficulty climbing stairs: {ACTIONS;DENIES/REPORTS:21021675::"Denies"} Difficulty getting out of chair: {ACTIONS;DENIES/REPORTS:21021675::"Denies"} Difficulty using hands for taps, buttons, cutlery, and/or writing: {ACTIONS;DENIES/REPORTS:21021675::"Denies"}  No Rheumatology ROS completed.   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 Not treated for it now.   IBS (irritable bowel syndrome)    dx in 2005 by primary MD   IBS (irritable bowel syndrome)    Obesity    Seasonal allergies    yr roud; was immunothrapy in childhood, does not need any more at this time   Sjogren's syndrome (HCC)     Family History  Problem Relation Age of Onset   Kidney failure Mother    Cancer Father 58        colon cancer   Healthy Sister    Healthy Brother    Cancer Brother    Healthy Sister    Eczema Daughter    Allergies Daughter    Eczema Son    Allergies Son    Past Surgical History:  Procedure Laterality Date   CESAREAN SECTION W/BTL  2007   cholectystectomy  2000   CHOLECYSTECTOMY  2000   COLONOSCOPY N/A 02/19/2013   Procedure: COLONOSCOPY;  Surgeon: Malissa Hippo, MD;  [facility];  Service: Endoscopy;  Laterality: N/A;  830   COLONOSCOPY WITH PROPOFOL N/A 10/31/2020   Procedure: COLONOSCOPY WITH PROPOFOL;  Surgeon: Malissa Hippo, MD;  [facility];  Service: Gastroenterology;  Laterality: N/A;  1130   POLYPECTOMY  10/31/2020   Procedure: POLYPECTOMY;  Surgeon: Malissa Hippo, MD;  [facility];  Service: Gastroenterology;;   Social History   Social History Narrative   Married 14 years, 2 children ages 31 and 64.    Job: Pt accounting    Gets regular exercise.    Right Handed   Drinks Caffeine   Lives in a one story home    Immunization History  Administered Date(s) Administered   Influenza-Unspecified 09/17/2017, 10/26/2019, 09/20/2022   Moderna Sars-Covid-2 Vaccination 01/26/2020, 02/23/2020     Objective: Vital Signs: There were no vitals taken for this visit.   Physical Exam   Musculoskeletal Exam: ***  CDAI Exam: CDAI Score: -- Patient Global: --; Provider Global: -- Swollen: --;  Tender: -- Joint Exam 09/12/2023   No joint exam has been documented for this visit   There is currently no information documented on the homunculus. Go to the Rheumatology activity and complete the homunculus joint exam.  Investigation: No additional findings.  Imaging: No results found.  Recent Labs: Lab Results  Component Value Date   WBC 5.8 09/23/2022   HGB 12.6 09/23/2022   PLT 280 09/23/2022   NA 140 02/20/2023   K 3.9 02/20/2023   CL 98 02/20/2023   CO2 26 02/20/2023   GLUCOSE 86 02/20/2023   BUN 9 02/20/2023    CREATININE 0.75 02/20/2023   BILITOT <0.2 09/23/2022   ALKPHOS 54 09/23/2022   AST 20 09/23/2022   ALT 17 09/23/2022   PROT 7.0 09/23/2022   ALBUMIN 4.2 09/23/2022   CALCIUM 9.7 09/23/2022   GFRAA 120 07/05/2020    Speciality Comments: No specialty comments available.  Procedures:  No procedures performed Allergies: Miralax [polyethylene glycol] and Vasotec [enalapril]   Assessment / Plan:     Visit Diagnoses: No diagnosis found.  Orders: No orders of the defined types were placed in this encounter.  No orders of the defined types were placed in this encounter.   Face-to-face time spent with patient was *** minutes. Greater than 50% of time was spent in counseling and coordination of care.  Follow-Up Instructions: No follow-ups on file.   Ellen Henri, CMA  Note - This record has been created using Animal nutritionist.  Chart creation errors have been sought, but may not always  have been located. Such creation errors do not reflect on  the standard of medical care.

## 2023-09-08 ENCOUNTER — Encounter: Payer: Self-pay | Admitting: Family Medicine

## 2023-09-08 DIAGNOSIS — R002 Palpitations: Secondary | ICD-10-CM

## 2023-09-09 NOTE — Telephone Encounter (Signed)
Nurses Typically we utilize the cardiologist who work with arrhythmias I would recommend Dr. Ladona Ridgel or Dr. Johney Frame Please go ahead with referral and keep Karista informed thank you

## 2023-09-12 ENCOUNTER — Ambulatory Visit: Payer: 59 | Admitting: Physician Assistant

## 2023-09-12 DIAGNOSIS — G600 Hereditary motor and sensory neuropathy: Secondary | ICD-10-CM

## 2023-09-12 DIAGNOSIS — M2241 Chondromalacia patellae, right knee: Secondary | ICD-10-CM

## 2023-09-12 DIAGNOSIS — M2242 Chondromalacia patellae, left knee: Secondary | ICD-10-CM

## 2023-09-12 DIAGNOSIS — M19041 Primary osteoarthritis, right hand: Secondary | ICD-10-CM

## 2023-09-12 DIAGNOSIS — M545 Low back pain, unspecified: Secondary | ICD-10-CM

## 2023-09-12 DIAGNOSIS — Z8719 Personal history of other diseases of the digestive system: Secondary | ICD-10-CM

## 2023-09-12 DIAGNOSIS — I10 Essential (primary) hypertension: Secondary | ICD-10-CM

## 2023-09-12 DIAGNOSIS — M2550 Pain in unspecified joint: Secondary | ICD-10-CM

## 2023-09-12 DIAGNOSIS — M3501 Sicca syndrome with keratoconjunctivitis: Secondary | ICD-10-CM

## 2023-09-12 DIAGNOSIS — Z79899 Other long term (current) drug therapy: Secondary | ICD-10-CM

## 2023-09-15 ENCOUNTER — Ambulatory Visit: Payer: 59 | Admitting: Neurology

## 2023-10-06 ENCOUNTER — Ambulatory Visit: Payer: 59

## 2023-10-06 ENCOUNTER — Encounter: Payer: Self-pay | Admitting: Internal Medicine

## 2023-10-06 ENCOUNTER — Other Ambulatory Visit (HOSPITAL_COMMUNITY)
Admission: RE | Admit: 2023-10-06 | Discharge: 2023-10-06 | Disposition: A | Discharge status: Home / Self Care | Payer: 59 | Source: Ambulatory Visit | Attending: Internal Medicine | Admitting: Internal Medicine

## 2023-10-06 ENCOUNTER — Ambulatory Visit: Payer: 59 | Attending: Internal Medicine | Admitting: Internal Medicine

## 2023-10-06 VITALS — BP 130/72 | HR 70 | Ht 59.5 in | Wt 183.2 lb

## 2023-10-06 DIAGNOSIS — Z136 Encounter for screening for cardiovascular disorders: Secondary | ICD-10-CM | POA: Diagnosis present

## 2023-10-06 DIAGNOSIS — R079 Chest pain, unspecified: Secondary | ICD-10-CM

## 2023-10-06 DIAGNOSIS — I10 Essential (primary) hypertension: Secondary | ICD-10-CM

## 2023-10-06 DIAGNOSIS — R002 Palpitations: Secondary | ICD-10-CM | POA: Diagnosis not present

## 2023-10-06 DIAGNOSIS — Z8249 Family history of ischemic heart disease and other diseases of the circulatory system: Secondary | ICD-10-CM | POA: Insufficient documentation

## 2023-10-06 NOTE — Patient Instructions (Signed)
Medication Instructions:  Your physician recommends that you continue on your current medications as directed. Please refer to the Current Medication list given to you today.  *If you need a refill on your cardiac medications before your next appointment, please call your pharmacy*   Lab Work: LP(a)  If you have labs (blood work) drawn today and your tests are completely normal, you will receive your results only by: MyChart Message (if you have MyChart) OR A paper copy in the mail If you have any lab test that is abnormal or we need to change your treatment, we will call you to review the results.   Testing/Procedures: Your physician has requested that you have en exercise stress myoview. For further information please visit [email or web address] Please follow instruction sheet, as given.    Follow-Up: At [facility], you and your health needs are our priority.  As part of our continuing mission to provide you with exceptional heart care, we have created designated Provider Care Teams.  These Care Teams include your primary Cardiologist (physician) and Advanced Practice Providers (APPs -  Physician Assistants and Nurse Practitioners) who all work together to provide you with the care you need, when you need it.  We recommend signing up for the patient portal called "MyChart".  Sign up information is provided on this After Visit Summary.  MyChart is used to connect with patients for Virtual Visits (Telemedicine).  Patients are able to view lab/test results, encounter notes, upcoming appointments, etc.  Non-urgent messages can be sent to your provider as well.   To learn more about what you can do with MyChart, go to [email or web address].    Your next appointment:    Follow up pending.   Provider:   You may see Vishnu P Mallipeddi, MD or one of the following Advanced Practice Providers on your designated Care Team:   [place], PA-C  Jacolyn Reedy, PA-C     Other  Instructions ZIO XT- Long Term Monitor Instructions   Your physician has requested you wear your ZIO patch monitor___14____days.   This is a single patch monitor.  [employer] supplies one patch monitor per enrollment.  Additional stickers are not available.   Please do not apply patch if you will be having a Nuclear Stress Test, Echocardiogram, Cardiac CT, MRI, or Chest Xray during the time frame you would be wearing the monitor. The patch cannot be worn during these tests.  You cannot remove and re-apply the ZIO XT patch monitor.   Your ZIO patch monitor will be sent USPS Priority mail from [facility] directly to your home address. The monitor may also be mailed to a PO BOX if home delivery is not available.   It may take 3-5 days to receive your monitor after you have been enrolled.   Once you have received you monitor, please review enclosed instructions.  Your monitor has already been registered assigning a specific monitor serial # to you.   Applying the monitor   Shave hair from upper left chest.   Hold abrader disc by orange tab.  Rub abrader in 40 strokes over left upper chest as indicated in your monitor instructions.   Clean area with 4 enclosed alcohol pads .  Use all pads to assure are is cleaned thoroughly.  Let dry.   Apply patch as indicated in monitor instructions.  Patch will be place under collarbone on left side of chest with arrow pointing upward.   Rub patch adhesive wings for 2 minutes.Remove white  label marked "1".  Remove white label marked "2".  Rub patch adhesive wings for 2 additional minutes.   While looking in a mirror, press and release button in center of patch.  A small green light will flash 3-4 times .  This will be your only indicator the monitor has been turned on.     Do not shower for the first 24 hours.  You may shower after the first 24 hours.   Press button if you feel a symptom. You will hear a small click.  Record Date, Time and Symptom  in the Patient Log Book.   When you are ready to remove patch, follow instructions on last 2 pages of Patient Log Book.  Stick patch monitor onto last page of Patient Log Book.   Place Patient Log Book in [place] box.  Use locking tab on box and tape box closed securely.  The [employer] and [employer] has [employer] on it.  Please place in mailbox as soon as possible.  Your physician should have your test results approximately 7 days after the monitor has been mailed back to [facility].   Call [facility] Customer Care at [phone] if you have questions regarding your ZIO XT patch monitor.  Call them immediately if you see an orange light blinking on your monitor.   If your monitor falls off in less than 4 days contact our Monitor department at [phone].  If your monitor becomes loose or falls off after 4 days call [employer] at [phone] for suggestions on securing your monitor.

## 2023-10-06 NOTE — Progress Notes (Signed)
Cardiology Office Note  Date: 10/06/2023   ID: Brittney [name], DOB 1971/01/29, MRN [id number]  PCP:  Babs Sciara, MD  Cardiologist:  Marjo Bicker, MD Electrophysiologist:  None   History of Present Illness: Brittney [name] is a 52 y.o. female known to have HTN was referred to cardiology clinic for evaluation of palpitations.  Ongoing palpitations for many years, worsening in the last 3 to 4 weeks, occurring daily and multiple times of the day.  Associated with chest heaviness lasting between seconds and hours.  No other symptoms of DOE, orthopnea, PND, dizziness, syncope or leg swelling.  Her mother passed away at the age of 12 years, her heart had to be shocked/resuscitated and also had congestive heart failure.  Past Medical History:  Diagnosis Date   Acid reflux    Chest pain    intermittent x 1 yr   Diabetic mellitus, gestational    Hypertension    during pregnancy. Not treated for it now.   IBS (irritable bowel syndrome)    dx in 2005 by primary MD   IBS (irritable bowel syndrome)    Obesity    Seasonal allergies    yr roud; was immunothrapy in childhood, does not need any more at this time   Sjogren's syndrome [facility])     Past Surgical History:  Procedure Laterality Date   CESAREAN SECTION W/BTL  2007   cholectystectomy  2000   CHOLECYSTECTOMY  2000   COLONOSCOPY N/A 02/19/2013   Procedure: COLONOSCOPY;  Surgeon: Malissa Hippo, MD;  [facility];  Service: Endoscopy;  Laterality: N/A;  830   COLONOSCOPY WITH PROPOFOL N/A 10/31/2020   Procedure: COLONOSCOPY WITH PROPOFOL;  Surgeon: Malissa Hippo, MD;  [facility];  Service: Gastroenterology;  Laterality: N/A;  1130   POLYPECTOMY  10/31/2020   Procedure: POLYPECTOMY;  Surgeon: Malissa Hippo, MD;  [facility];  Service: Gastroenterology;;    Current Outpatient Medications  Medication Sig Dispense Refill   Berberine Chloride (BERBERINE HCI PO) Take 1,200 mg by mouth  daily.     estradiol (ESTRACE) 0.1 MG/GM vaginal cream Place 1 g vaginally 2 (two) times a week.     linaclotide (LINZESS) 290 MCG CAPS capsule Take 1 capsule (290 mcg total) by mouth daily before breakfast. 30 capsule 5   losartan (COZAAR) 100 MG tablet TAKE 1 TABLET BY MOUTH EVERY DAY 90 tablet 1   MAGNESIUM PO Take 400 mg by mouth daily.      meclizine (ANTIVERT) 25 MG tablet Take 1 tablet (25 mg total) by mouth 3 (three) times daily as needed for dizziness or nausea. 30 tablet 1   ondansetron (ZOFRAN) 8 MG tablet 1 tablet taken 3 times daily as needed 18 tablet 2   pilocarpine (SALAGEN) 5 MG tablet TAKE 1 TABLET BY MOUTH 3 TIMES DAILY AS NEEDED. 270 tablet 0   progesterone (PROMETRIUM) 100 MG capsule Take 100-200 mg by mouth at bedtime.     Testosterone 20 % CREA Apply 0.25 mLs topically 2 (two) times daily.     Cholecalciferol (VITAMIN D) 125 MCG (5000 UT) CAPS Take 5,000 Units by mouth daily.  (Patient not taking: Reported on 06/12/2023)     Collagen 500 MG CAPS Take by mouth daily. Bone Broth  Scoop = 15 grams (Patient not taking: Reported on 06/12/2023)     Multiple Vitamins-Minerals (MULTIVITAMIN PO) Take 1 tablet by mouth daily. Alive for Woman 50 + (Patient not taking: Reported on  10/06/2023)     Omega-3 Fatty Acids (FISH OIL PO) Take 400 mg by mouth daily. (Patient not taking: Reported on 10/06/2023)     TURMERIC PO Take 375 mg by mouth daily.  (Patient not taking: Reported on 06/12/2023)     UNABLE TO FIND Take 10 mg by mouth as needed. CBD gummie (Patient not taking: Reported on 06/12/2023)     No current facility-administered medications for this visit.   Allergies:  Miralax [polyethylene glycol] and Vasotec [enalapril]   Social History: The patient  reports that she has never smoked. She has been exposed to tobacco smoke. She has never used smokeless tobacco. She reports current alcohol use. She reports that she does not use drugs.   Family History: The patient's family history  includes Allergies in her daughter and son; Cancer in her brother; Cancer (age of onset: 5) in her father; Eczema in her daughter and son; Healthy in her brother, sister, and sister; Kidney failure in her mother.   ROS:  Please see the history of present illness. Otherwise, complete review of systems is positive for none.  All other systems are reviewed and negative.   Physical Exam: VS:  BP 130/72 (BP Location: Left Arm)   Pulse 70   Ht 4' 11.5" (1.511 m)   Wt 183 lb 3.2 oz (83.1 kg)   SpO2 100%   BMI 36.38 kg/m , BMI Body mass index is 36.38 kg/m.  Wt Readings from Last 3 Encounters:  10/06/23 183 lb 3.2 oz (83.1 kg)  06/12/23 176 lb 11.2 oz (80.2 kg)  02/24/23 175 lb 9.6 oz (79.7 kg)    General: Patient appears comfortable at rest. HEENT: Conjunctiva and lids normal, oropharynx clear with moist mucosa. Neck: Supple, no elevated JVP or carotid bruits, no thyromegaly. Lungs: Clear to auscultation, nonlabored breathing at rest. Cardiac: Regular rate and rhythm, no S3 or significant systolic murmur, no pericardial rub. Abdomen: Soft, nontender, no hepatomegaly, bowel sounds present, no guarding or rebound. Extremities: No pitting edema, distal pulses 2+. Skin: Warm and dry. Musculoskeletal: No kyphosis. Neuropsychiatric: Alert and oriented x3, affect grossly appropriate.  Recent Labwork: 02/20/2023: BUN 9; Creatinine, Ser 0.75; Potassium 3.9; Sodium 140     Component Value Date/Time   CHOL 193 02/20/2023 1619   TRIG 95 02/20/2023 1619   HDL 62 02/20/2023 1619   CHOLHDL 3.1 02/20/2023 1619   CHOLHDL 3.2 06/22/2021 1248   VLDL 14 06/22/2021 1248   LDLCALC 114 (H) 02/20/2023 1619     Assessment and Plan:  Palpitations Chest pain Family history of ?CAD HTN, controlled   -Chronic ongoing palpitations for many years, worsened in the last 3 to 4 weeks, occurring daily and multiple times throughout the day. Last for a few seconds. Has been drinking 1 cup of coffee for many  years and chocolate, tea few times.  Obtain 2-week event monitor, non-live.  She also experienced chest pain/heaviness lasting for at least 1 hour during these palpitations.  Will obtain exercise Myoview.  Her mother passed away at the age of 60 years, her heart had to be resuscitated/shocked, obtain lipoprotein a levels. -If the above tests are normal, symptoms could likely be secondary to perimenopause. -Continue current antihypertensives, losartan 100 mg once daily.  I spent a total duration of 40 minutes reviewing the notes, labs, imaging studies, face-to-face discussion/counseling of her medical condition, pathophysiology, evaluation, management, ordering test and documenting the findings in the note.  Medication Adjustments/Labs and Tests Ordered: Current medicines are reviewed at  length with the patient today.  Concerns regarding medicines are outlined above.    Disposition:  Follow up pending results  Signed, Emry Barbato Verne Spurr, MD, 10/06/2023 2:47 PM    [facility] [address], [place], [place] [address]

## 2023-10-08 ENCOUNTER — Ambulatory Visit: Payer: 59 | Admitting: Neurology

## 2023-10-10 ENCOUNTER — Other Ambulatory Visit: Payer: Self-pay

## 2023-10-10 MED ORDER — ROSUVASTATIN CALCIUM 20 MG PO TABS: 20.0000 mg | ORAL_TABLET | Freq: Every day | ORAL | 3 refills | Status: DC

## 2023-10-12 ENCOUNTER — Other Ambulatory Visit: Payer: Self-pay | Admitting: Family Medicine

## 2023-10-13 LAB — LIPOPROTEIN A (LPA): Lipoprotein (a): 188.6 nmol/L — ABNORMAL HIGH (ref <75.0)

## 2023-11-03 ENCOUNTER — Inpatient Hospital Stay (HOSPITAL_COMMUNITY): Admission: RE | Admit: 2023-11-03 | Payer: 59 | Source: Ambulatory Visit

## 2023-11-03 ENCOUNTER — Encounter (HOSPITAL_COMMUNITY): Payer: 59

## 2023-11-17 ENCOUNTER — Other Ambulatory Visit (HOSPITAL_COMMUNITY): Payer: 59

## 2023-11-17 ENCOUNTER — Encounter (HOSPITAL_COMMUNITY): Payer: 59

## 2023-12-09 ENCOUNTER — Other Ambulatory Visit (HOSPITAL_COMMUNITY): Payer: Self-pay | Admitting: Obstetrics and Gynecology

## 2023-12-09 DIAGNOSIS — Z1231 Encounter for screening mammogram for malignant neoplasm of breast: Secondary | ICD-10-CM

## 2023-12-10 ENCOUNTER — Inpatient Hospital Stay
Admission: RE | Admit: 2023-12-10 | Discharge: 2023-12-10 | Disposition: A | Discharge status: Home / Self Care | Payer: Self-pay | Source: Ambulatory Visit | Attending: Obstetrics and Gynecology | Admitting: Obstetrics and Gynecology

## 2023-12-10 ENCOUNTER — Other Ambulatory Visit (HOSPITAL_COMMUNITY): Payer: Self-pay | Admitting: Obstetrics and Gynecology

## 2023-12-10 DIAGNOSIS — Z1231 Encounter for screening mammogram for malignant neoplasm of breast: Secondary | ICD-10-CM

## 2023-12-18 ENCOUNTER — Ambulatory Visit (HOSPITAL_COMMUNITY)
Admission: RE | Admit: 2023-12-18 | Discharge: 2023-12-18 | Disposition: A | Discharge status: Home / Self Care | Payer: 59 | Source: Ambulatory Visit | Attending: Obstetrics and Gynecology | Admitting: Obstetrics and Gynecology

## 2023-12-18 ENCOUNTER — Encounter (HOSPITAL_COMMUNITY): Payer: Self-pay

## 2023-12-18 DIAGNOSIS — Z1231 Encounter for screening mammogram for malignant neoplasm of breast: Secondary | ICD-10-CM | POA: Insufficient documentation

## 2023-12-21 ENCOUNTER — Other Ambulatory Visit (INDEPENDENT_AMBULATORY_CARE_PROVIDER_SITE_OTHER): Payer: Self-pay | Admitting: Gastroenterology

## 2023-12-22 NOTE — Telephone Encounter (Signed)
Called patient to clarify since her last ov note states 290 mcg and she has on med list 290 and she said she hit the wrong button when reordering and she is taking 290 and needs refill of 290. Last seen July 2024

## 2023-12-30 ENCOUNTER — Ambulatory Visit: Payer: 59 | Attending: Internal Medicine | Admitting: Internal Medicine

## 2023-12-30 ENCOUNTER — Encounter: Payer: Self-pay | Admitting: Internal Medicine

## 2023-12-30 ENCOUNTER — Telehealth: Payer: Self-pay | Admitting: Internal Medicine

## 2023-12-30 VITALS — Wt 185.7 lb

## 2023-12-30 DIAGNOSIS — E7841 Elevated Lipoprotein(a): Secondary | ICD-10-CM | POA: Diagnosis not present

## 2023-12-30 DIAGNOSIS — E785 Hyperlipidemia, unspecified: Secondary | ICD-10-CM

## 2023-12-30 DIAGNOSIS — E7849 Other hyperlipidemia: Secondary | ICD-10-CM

## 2023-12-30 MED ORDER — REPATHA SURECLICK 140 MG/ML ~~LOC~~ SOAJ: 140.0000 mg | SUBCUTANEOUS | 2 refills | Status: DC

## 2023-12-30 NOTE — Patient Instructions (Addendum)
Medication Instructions:  Your physician has recommended you make the following change in your medication:  Start Repatha 140 mg every 14 days Continue taking all other medications as prescribed  Labwork: None  Testing/Procedures: None  Follow-Up: Your physician recommends that you schedule a follow-up appointment in: 6 months  Any Other Special Instructions Will Be Listed Below (If Applicable).  If you need a refill on your cardiac medications before your next appointment, please call your pharmacy.

## 2023-12-30 NOTE — Telephone Encounter (Signed)
  Patient Consent for Virtual Visit        Brittney [name] has provided verbal consent on 12/30/2023 for a virtual visit (video or telephone).   CONSENT FOR VIRTUAL VISIT FOR:  Brittney [name]  By participating in this virtual visit I agree to the following:  I hereby voluntarily request, consent and authorize [facility] and its employed or contracted physicians, physician assistants, nurse practitioners or other licensed health care professionals (the Practitioner), to provide me with telemedicine health care services (the "Services") as deemed necessary by the treating Practitioner. I acknowledge and consent to receive the Services by the Practitioner via telemedicine. I understand that the telemedicine visit will involve communicating with the Practitioner through live audiovisual communication technology and the disclosure of certain medical information by electronic transmission. I acknowledge that I have been given the opportunity to request an in-person assessment or other available alternative prior to the telemedicine visit and am voluntarily participating in the telemedicine visit.  I understand that I have the right to withhold or withdraw my consent to the use of telemedicine in the course of my care at any time, without affecting my right to future care or treatment, and that the Practitioner or I may terminate the telemedicine visit at any time. I understand that I have the right to inspect all information obtained and/or recorded in the course of the telemedicine visit and may receive copies of available information for a reasonable fee.  I understand that some of the potential risks of receiving the Services via telemedicine include:  Delay or interruption in medical evaluation due to technological equipment failure or disruption; Information transmitted may not be sufficient (e.g. poor resolution of images) to allow for appropriate medical decision making by the Practitioner;  and/or  In rare instances, security protocols could fail, causing a breach of personal health information.  Furthermore, I acknowledge that it is my responsibility to provide information about my medical history, conditions and care that is complete and accurate to the best of my ability. I acknowledge that Practitioner's advice, recommendations, and/or decision may be based on factors not within their control, such as incomplete or inaccurate data provided by me or distortions of diagnostic images or specimens that may result from electronic transmissions. I understand that the practice of medicine is not an exact science and that Practitioner makes no warranties or guarantees regarding treatment outcomes. I acknowledge that a copy of this consent can be made available to me via my patient portal [facility] MyChart), or I can request a printed copy by calling the office of [facility].    I understand that my insurance will be billed for this visit.   I have read or had this consent read to me. I understand the contents of this consent, which adequately explains the benefits and risks of the Services being provided via telemedicine.  I have been provided ample opportunity to ask questions regarding this consent and the Services and have had my questions answered to my satisfaction. I give my informed consent for the services to be provided through the use of telemedicine in my medical care

## 2023-12-30 NOTE — Progress Notes (Signed)
Virtual Visit via Telephone Note   Because of Brittney [name]'s co-morbid illnesses, she is at least at moderate risk for complications without adequate follow up.  This format is felt to be most appropriate for this patient at this time.  The patient did not have access to video technology/had technical difficulties with video requiring transitioning to audio format only (telephone).  All issues noted in this document were discussed and addressed.  No physical exam could be performed with this format.  Please refer to the patient's chart for her consent to telehealth for [facility].    Date:  12/30/2023   ID:  Brittney [name], DOB November 11, 1971, MRN [id number] The patient was identified using 2 identifiers.  Patient Location: Home Provider Location: Office/Clinic   PCP:  Babs Sciara, MD   [facility] Providers Cardiologist:  Marjo Bicker, MD     Evaluation Performed:  Follow-Up Visit  Chief Complaint: To discuss event monitor results  History of Present Illness:    Brittney [name] is a 53 y.o. 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  I discussed event monitor results with the patient.  No arrhythmias.  Palpitations completely resolved.  No more chest pains either.  Stress test was pending.  I prescribed her statin due to elevated lipoprotein a levels, 188.  She did notice worsening of muscle aches after using statin.  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  No other symptoms of DOE, dizziness, syncope, leg swelling.  Past Medical History:  Diagnosis Date   Acid reflux    Chest pain    intermittent x 1 yr   Diabetic mellitus, gestational    Hypertension    during pregnancy. Not treated for it now.   IBS (irritable bowel syndrome)    dx in 2005 by primary MD   IBS (irritable bowel syndrome)    Obesity    Seasonal allergies    yr roud; was immunothrapy in childhood, does not need any  more at this time   Sjogren's syndrome [facility])    Past Surgical History:  Procedure Laterality Date   CESAREAN SECTION W/BTL  2007   cholectystectomy  2000   CHOLECYSTECTOMY  2000   COLONOSCOPY N/A 02/19/2013   Procedure: COLONOSCOPY;  Surgeon: Malissa Hippo, MD;  [facility];  Service: Endoscopy;  Laterality: N/A;  830   COLONOSCOPY WITH PROPOFOL N/A 10/31/2020   Procedure: COLONOSCOPY WITH PROPOFOL;  Surgeon: Malissa Hippo, MD;  [facility];  Service: Gastroenterology;  Laterality: N/A;  1130   POLYPECTOMY  10/31/2020   Procedure: POLYPECTOMY;  Surgeon: Malissa Hippo, MD;  [facility];  Service: Gastroenterology;;     Current Meds  Medication Sig   Berberine Chloride (BERBERINE HCI PO) Take 1,200 mg by mouth daily.   Cholecalciferol (VITAMIN D) 125 MCG (5000 UT) CAPS Take 5,000 Units by mouth daily.   linaclotide (LINZESS) 290 MCG CAPS capsule Take 1 capsule (290 mcg total) by mouth daily before breakfast.   losartan (COZAAR) 100 MG tablet TAKE 1 TABLET BY MOUTH EVERY DAY   MAGNESIUM PO Take 400 mg by mouth daily.    meclizine (ANTIVERT) 25 MG tablet Take 1 tablet (25 mg total) by mouth 3 (three) times daily as needed for dizziness or nausea.   Multiple Vitamins-Minerals (MULTIVITAMIN PO) Take 1 tablet by mouth daily. Alive for Woman 50 +   Omega-3 Fatty Acids (FISH OIL PO)  Take 400 mg by mouth daily.   ondansetron (ZOFRAN) 8 MG tablet 1 tablet taken 3 times daily as needed   pilocarpine (SALAGEN) 5 MG tablet TAKE 1 TABLET BY MOUTH 3 TIMES DAILY AS NEEDED.   TURMERIC PO Take 375 mg by mouth daily.     Allergies:   Miralax [polyethylene glycol] and Vasotec [enalapril]   Social History   Tobacco Use   Smoking status: Never    Passive exposure: Current (very minimal)   Smokeless tobacco: Never  Vaping Use   Vaping status: Never Used  Substance Use Topics   Alcohol use: Yes    Comment: occassionally    Drug use: Never     Family  Hx: The patient's family history includes Allergies in her daughter and son; Cancer in her brother; Cancer (age of onset: 23) in her father; Eczema in her daughter and son; Healthy in her brother, sister, and sister; Kidney failure in her mother.  ROS:   Please see the history of present illness.     All other systems reviewed and are negative.   Prior CV studies:   The following studies were reviewed today:    Labs/Other Tests and Data Reviewed:    EKG:    Recent Labs: 02/20/2023: BUN 9; Creatinine, Ser 0.75; Potassium 3.9; Sodium 140   Recent Lipid Panel Lab Results  Component Value Date/Time   CHOL 193 02/20/2023 04:19 PM   TRIG 95 02/20/2023 04:19 PM   HDL 62 02/20/2023 04:19 PM   CHOLHDL 3.1 02/20/2023 04:19 PM   CHOLHDL 3.2 06/22/2021 12:48 PM   LDLCALC 114 (H) 02/20/2023 04:19 PM    Wt Readings from Last 3 Encounters:  12/30/23 185 lb 11.2 oz (84.2 kg)  10/06/23 183 lb 3.2 oz (83.1 kg)  06/12/23 176 lb 11.2 oz (80.2 kg)     Risk Assessment/Calculations:         Objective:    Vital Signs:  Wt 185 lb 11.2 oz (84.2 kg)   LMP 12/18/2023   BMI 36.88 kg/m      ASSESSMENT & PLAN:     Hyperlipidemia, LDL 114 Elevated lipoprotein a levels, 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 I do not think she will benefit from trial of any new statin.  Will start PCSK9 inhibitors, Repatha. -Continue current antihypertensives, losartan 100 mg once daily, follow-up with PCP.      Time:   Today, I have spent 30 minutes with the patient with telehealth technology discussing the above problems, ordering meds and documenting.   Medication Adjustments/Labs and Tests Ordered: Current medicines are reviewed at length with the patient today.  Concerns  regarding medicines are outlined above.   Tests Ordered: No orders of the defined types were placed in this encounter.   Medication Changes: No orders of the defined types were placed in this encounter.   Follow Up:   6 months  Signed, Rindy Kollman P Keagon Glascoe, MD  12/30/2023 1:03 PM    [facility]

## 2023-12-30 NOTE — Progress Notes (Signed)
Office Visit Note  Patient: Brittney [name]             Date of Birth: Jul 11, 1971           MRN: [id number]             PCP: Babs Sciara, MD Referring: Babs Sciara, MD Visit Date: 01/13/2024 Occupation: @GUAROCC @  Subjective:  Eye dryness  History of Present Illness: Brittney [name] is a 53 y.o. female with history of sjogren's syndrome and osteoarthritis.  Patient was last seen in the office on 09/20/2022.  Patient presents today with several new concerns.  She has been experiencing increased eye dryness on a daily basis for the past 3 months.  She has been using Systane eyedrops daily with minimal improvement.  She has not followed back up with her ophthalmologist but has an upcoming appointment scheduled.  Patient continues to have chronic mouth dryness which has been unchanged.  She has been taking pilocarpine a fourth of a tablet 1-2 times per week for symptomatic relief. Patient states she is also been having increased pain in both hands with constant swelling for the past several months.  She denies any other joint pain or joint swelling at this time.   Activities of Daily Living:  Patient reports morning stiffness for 15-20 minutes.   Patient Reports nocturnal pain.  Difficulty dressing/grooming: Denies Difficulty climbing stairs: Denies Difficulty getting out of chair: Denies Difficulty using hands for taps, buttons, cutlery, and/or writing: Reports  Review of Systems  Constitutional:  Positive for fatigue.  HENT:  Positive for mouth dryness and nose dryness. Negative for mouth sores.   Eyes:  Positive for pain and dryness.  Respiratory:  Negative for shortness of breath and difficulty breathing.   Cardiovascular:  Positive for palpitations. Negative for chest pain.  Gastrointestinal:  Positive for constipation. Negative for blood in stool and diarrhea.  Endocrine: Negative for increased urination.  Genitourinary:  Negative for involuntary urination.  Musculoskeletal:   Positive for joint pain, joint pain, joint swelling, myalgias, muscle weakness, morning stiffness, muscle tenderness and myalgias. Negative for gait problem.  Skin:  Positive for hair loss. Negative for color change, rash and sensitivity to sunlight.  Allergic/Immunologic: Negative for susceptible to infections.  Neurological:  Positive for light-headedness. Negative for dizziness and headaches.  Hematological:  Negative for swollen glands.  Psychiatric/Behavioral:  Positive for depressed mood and sleep disturbance. The patient is nervous/anxious.     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 Not treated for it now.   IBS (irritable bowel syndrome)    dx in 2005 by primary MD   IBS (irritable bowel syndrome)    Obesity  Seasonal allergies    yr roud; was immunothrapy in childhood, does not need any more at this time   Sjogren's syndrome (HCC)     Family History  Problem Relation Age of Onset   Kidney failure Mother    Cancer Father 72       colon cancer   Healthy Sister    Healthy Brother    Cancer Brother    Healthy Sister    Eczema Daughter    Allergies Daughter    Eczema  Son    Allergies Son    Past Surgical History:  Procedure Laterality Date   CESAREAN SECTION W/BTL  2007   cholectystectomy  2000   CHOLECYSTECTOMY  2000   COLONOSCOPY N/A 02/19/2013   Procedure: COLONOSCOPY;  Surgeon: Malissa Hippo, MD;  Location: [facility];  Service: Endoscopy;  Laterality: N/A;  830   COLONOSCOPY WITH PROPOFOL N/A 10/31/2020   Procedure: COLONOSCOPY WITH PROPOFOL;  Surgeon: Malissa Hippo, MD;  Location: [facility];  Service: Gastroenterology;  Laterality: N/A;  1130   POLYPECTOMY  10/31/2020   Procedure: POLYPECTOMY;  Surgeon: Malissa Hippo, MD;  Location: [facility];  Service: Gastroenterology;;   Social History   Social History Narrative   Married 14 years, 2 children ages 80 and 86.    Job: Pt accounting    Gets regular exercise.    Right Handed   Drinks Caffeine   Lives in a one story home    Immunization History  Administered Date(s) Administered   Influenza-Unspecified 09/17/2017, 10/26/2019, 09/20/2022   Moderna Sars-Covid-2 Vaccination 01/26/2020, 02/23/2020     Objective: Vital Signs: BP (!) 144/94 (BP Location: Left Arm, Patient Position: Sitting, Cuff Size: Normal)   Pulse 67   Resp 16   Ht 4\' 11"  (1.499 m)   Wt 190 lb 3.2 oz (86.3 kg)   LMP 12/18/2023   BMI 38.42 kg/m    Physical Exam Vitals and nursing note reviewed.  Constitutional:      Appearance: She is well-developed.  HENT:     Head: Normocephalic and atraumatic.  Eyes:     Conjunctiva/sclera: Conjunctivae normal.  Cardiovascular:     Rate and Rhythm: Normal rate and regular rhythm.     Heart sounds: Normal heart sounds.  Pulmonary:     Effort: Pulmonary effort is normal.     Breath sounds: Normal breath sounds.  Abdominal:     General: Bowel sounds are normal.     Palpations: Abdomen is soft.  Musculoskeletal:     Cervical back: Normal range of motion.  Lymphadenopathy:     Cervical: No cervical adenopathy.  Skin:    General: Skin is warm and dry.      Capillary Refill: Capillary refill takes less than 2 seconds.  Neurological:     Mental Status: She is alert and oriented to person, place, and time.  Psychiatric:        Behavior: Behavior normal.      Musculoskeletal Exam: C-spine, thoracic spine, lumbar spine and good range of motion.  Shoulder joints, elbow joints, wrist joints, MCPs, PIPs, DIPs have good range of motion with no synovitis.  Some diffuse edema noted in both hands but no obvious synovitis noted.  Complete fist formation bilaterally.  Hip joints have good range of motion no groin pain.  Knee joints have good range of motion no warmth or effusion.  Ankle joints have good range of motion with no tenderness or joint swelling.  CDAI  Exam: CDAI Score: -- Patient Global: --; Provider Global: -- Swollen: --; Tender: -- Joint Exam 01/13/2024   No joint exam has been documented for this visit   There is currently no information documented on the homunculus. Go to the Rheumatology activity and complete the homunculus joint exam.  Investigation: No additional findings.  Imaging: MM 3D SCREENING MAMMOGRAM BILATERAL BREAST Result Date: 12/19/2023 CLINICAL DATA:  Screening. EXAM: DIGITAL SCREENING BILATERAL MAMMOGRAM WITH TOMOSYNTHESIS AND CAD TECHNIQUE: Bilateral screening digital craniocaudal and mediolateral oblique mammograms were obtained. Bilateral screening digital breast tomosynthesis was performed. The images were evaluated with computer-aided detection. COMPARISON:  Previous exam(s). ACR Breast Density Category c: The breasts are heterogeneously dense, which may obscure small masses. FINDINGS: There are no findings suspicious for malignancy. IMPRESSION: No mammographic evidence of malignancy. A result letter of this screening mammogram will be mailed directly to the patient. RECOMMENDATION: Screening mammogram in one year. (Code:SM-B-01Y) BI-RADS CATEGORY  1: Negative. Electronically Signed   By: Frederico Hamman M.D.   On:  12/19/2023 13:53    Recent Labs: Lab Results  Component Value Date   WBC 5.8 09/23/2022   HGB 12.6 09/23/2022   PLT 280 09/23/2022   NA 140 02/20/2023   K 3.9 02/20/2023   CL 98 02/20/2023   CO2 26 02/20/2023   GLUCOSE 86 02/20/2023   BUN 9 02/20/2023   CREATININE 0.75 02/20/2023   BILITOT <0.2 09/23/2022   ALKPHOS 54 09/23/2022   AST 20 09/23/2022   ALT 17 09/23/2022   PROT 7.0 09/23/2022   ALBUMIN 4.2 09/23/2022   CALCIUM 9.7 09/23/2022   GFRAA 120 07/05/2020    Speciality Comments: No specialty comments available.  Procedures:  No procedures performed Allergies: Miralax [polyethylene glycol] and Vasotec [enalapril]   Assessment / Plan:     Visit Diagnoses: Sjogren's syndrome with keratoconjunctivitis sicca (HCC) - ANA 1: 160 nuclear and centromere pattern, positive anti-Ro antibody.  Rest of the ENA negative, chronic mild sicca symptoms: Patient presents today experiencing increased eye dryness for the past 3 months.  She has been using Systane eyedrops on a daily basis for symptomatic relief.  She has started to notice a gritty feeling as well as some sensitivity with light and has an upcoming appointment scheduled with ophthalmology for further evaluation.  Discussed that she may benefit from using a humidifier in her home as well as an eye mask at night in case she has incomplete lid closure.  She continues to have mouth dryness and has been using over-the-counter products for symptomatic relief.  She is also prescribed pilocarpine but has been taking about a quarter of a tablet 1-2 times per week.  She does not need a refill of pilocarpine at this time. She has been experiencing increased pain and stiffness involving both hands for the past several months.  On examination today no obvious synovitis was noted.  X-rays of both hands from 11/16/2021 were consistent with early osteoarthritic changes.  No erosive changes were noted at that time.  Patient was advised to notify us  if her symptoms persist or worsen.  Discussed the use of arthritis compression gloves as well as the use of Voltaren gel which she can buy topically for pain relief. Discussed that if her symptoms persist or worsen she can initiate a trial of Plaquenil.  She has declined treatment at this time. Discussed the increased risk for developing lymphoma in patients with Sjogren's syndrome--plan to update SPEP, CBC with differential, and sed rate today but she  has declined.  Also discussed the increased risk for developing interstitial lung disease in patients with Sjogren's syndrome.  She has not developed any new or worsening pulmonary symptoms. The patient would like to hold off on having updated lab work at this time due to a change of insurance/cost.  Future orders for the following labs were placed today in case the patient reaches her deductible and would like to have lab work obtained.  She would like to follow-up in the office in 1 year but can return sooner if needed. - Plan: Protein / creatinine ratio, urine, CBC with Differential/Platelet, COMPLETE METABOLIC PANEL WITH GFR, C3 and C4, Sedimentation rate, Rheumatoid factor, Serum protein electrophoresis with reflex, ANA, Sjogrens syndrome-A extractable nuclear antibody, Sjogrens syndrome-B extractable nuclear antibody  High risk medication use - Declined Plaquenil in August 2020 and at the office visit on 11/17/20 and today on 01/13/24. - Plan: CBC with Differential/Platelet, COMPLETE METABOLIC PANEL WITH GFR  Primary osteoarthritis of both hands: X-rays of both hands from 11/16/2021 were consistent with early osteoarthritis.  No erosive changes were noted.  She presents today with increased pain in both hands.  She has noticed constant swelling in both hands for the past several months--edema noted bilaterally.  No obvious synovitis was noted.  Plan to check sed rate but she has declined updated lab work at this time.  Discussed that if she continues to  have persistent inflammation she may want to consider the use of Plaquenil.  She has declined treatment at this time.  Discussed the use of arthritis compression gloves in the meantime.  Chondromalacia patellae, right knee: Mild chondromalacia patella noted on 11/16/2021.  No warmth or effusion noted.  Chondromalacia, patella, left: Mild chondromalacia patella noted on 11/16/2021 -no warmth or effusion noted.  Chronic midline low back pain without sciatica - HLA-B27 antigen negative on 06/22/2021.  X-rays of the lumbar spine from 11/16/2021 were consistent with mild facet joint narrowing.  No significant disc space narrowing was noted.  She has been experiencing intermittent discomfort in her lower back but has no symptoms of radiculopathy.  Hypermobility arthralgia  Other medical conditions are listed as follows:  Charcot-Marie-Tooth disease type 1A - Diagnosed by Dr. Gerlean Ren by Dr. Allena Katz today--no new medications were prescribed.  Plan to follow-up in 1 year.  Essential hypertension, benign: Blood pressure was 144/94 today in the office.  History of IBS  History of gastroesophageal reflux (GERD)  Orders: Orders Placed This Encounter  Procedures   Protein / creatinine ratio, urine   CBC with Differential/Platelet   COMPLETE METABOLIC PANEL WITH GFR   C3 and C4   Sedimentation rate   Rheumatoid factor   Serum protein electrophoresis with reflex   ANA   Sjogrens syndrome-A extractable nuclear antibody   Sjogrens syndrome-B extractable nuclear antibody   No orders of the defined types were placed in this encounter.     Follow-Up Instructions: Return in about 1 year (around 01/12/2025) for Sjogren's syndrome.   Gearldine Bienenstock, PA-C  Note - This record has been created using Dragon software.  Chart creation errors have been sought, but may not always  have been located. Such creation errors do not reflect on  the standard of medical care.

## 2024-01-05 ENCOUNTER — Telehealth: Payer: Self-pay

## 2024-01-05 MED ORDER — ATORVASTATIN CALCIUM 40 MG PO TABS: 40.0000 mg | ORAL_TABLET | Freq: Every day | ORAL | 3 refills | Status: DC

## 2024-01-05 NOTE — Telephone Encounter (Signed)
Mallipeddi, Orion Modest, MD  Nori Riis, RN Stop Crestor and start Atorvastatin 40 mg at bedtime after a two week wash out period. If she continues to have side-effects, will need to start Repatha.       I have sent this message to patient via MyChart and sent new rx to [facility]

## 2024-01-09 ENCOUNTER — Ambulatory Visit: Payer: 59 | Admitting: Physician Assistant

## 2024-01-13 ENCOUNTER — Encounter: Payer: Self-pay | Admitting: Physician Assistant

## 2024-01-13 ENCOUNTER — Ambulatory Visit (INDEPENDENT_AMBULATORY_CARE_PROVIDER_SITE_OTHER): Payer: 59 | Admitting: Neurology

## 2024-01-13 ENCOUNTER — Ambulatory Visit: Payer: 59 | Attending: Physician Assistant | Admitting: Physician Assistant

## 2024-01-13 VITALS — BP 136/86 | HR 80 | Ht 59.5 in | Wt 190.0 lb

## 2024-01-13 VITALS — BP 144/94 | HR 67 | Resp 16 | Ht 59.0 in | Wt 190.2 lb

## 2024-01-13 DIAGNOSIS — M19042 Primary osteoarthritis, left hand: Secondary | ICD-10-CM

## 2024-01-13 DIAGNOSIS — G600 Hereditary motor and sensory neuropathy: Secondary | ICD-10-CM

## 2024-01-13 DIAGNOSIS — M2241 Chondromalacia patellae, right knee: Secondary | ICD-10-CM | POA: Diagnosis not present

## 2024-01-13 DIAGNOSIS — M545 Low back pain, unspecified: Secondary | ICD-10-CM

## 2024-01-13 DIAGNOSIS — M2550 Pain in unspecified joint: Secondary | ICD-10-CM

## 2024-01-13 DIAGNOSIS — Z79899 Other long term (current) drug therapy: Secondary | ICD-10-CM | POA: Diagnosis not present

## 2024-01-13 DIAGNOSIS — M19041 Primary osteoarthritis, right hand: Secondary | ICD-10-CM

## 2024-01-13 DIAGNOSIS — M3501 Sicca syndrome with keratoconjunctivitis: Secondary | ICD-10-CM | POA: Diagnosis not present

## 2024-01-13 DIAGNOSIS — I10 Essential (primary) hypertension: Secondary | ICD-10-CM

## 2024-01-13 DIAGNOSIS — G8929 Other chronic pain: Secondary | ICD-10-CM

## 2024-01-13 DIAGNOSIS — Z8719 Personal history of other diseases of the digestive system: Secondary | ICD-10-CM

## 2024-01-13 DIAGNOSIS — M2242 Chondromalacia patellae, left knee: Secondary | ICD-10-CM

## 2024-01-13 NOTE — Progress Notes (Signed)
Follow-up Visit   Date: 01/13/24   Charlisa Cham MRN: [id number] DOB: 27-Jul-1971   Interim History: Brittney [name] is a 53 y.o. female with Sjogren's syndrome and hypertension returning to the clinic for follow-up of CMT.  The patient was accompanied to the clinic by self.  IMPRESSION/PLAN: Charcot Marie Tooth type 1A (copy number 3) manifesting with paresthesias of the hands and feet, stable.  Symptoms are not painful to start medication such as gabapentin.  Balance is good.  OT for hand weakness was declined. Continue to monitor.    Return to clinic in 1 year  ---------------------------------- UPDATE 01/13/2024:  She has noticed increased numbness/tingling in the legs and weakness in the hands.  She does not have significant numbness/tingling in the hands.  She gets more tired when cooking holiday meals.  She also has hand swelling and stiffness and will be seeing rheumatology today.  Balance is good.  She is contemplating on starting weight loss medication.  Medications:  Current Outpatient Medications on File Prior to Visit  Medication Sig Dispense Refill   Berberine Chloride (BERBERINE HCI PO) Take 1,200 mg by mouth daily.     Cholecalciferol (VITAMIN D) 125 MCG (5000 UT) CAPS Take 5,000 Units by mouth daily.     Collagen 500 MG CAPS Take by mouth daily. Bone Broth  Scoop = 15 grams     Evolocumab (REPATHA SURECLICK) 140 MG/ML SOAJ Inject 140 mg into the skin every 14 (fourteen) days. 2 mL 2   linaclotide (LINZESS) 290 MCG CAPS capsule Take 1 capsule (290 mcg total) by mouth daily before breakfast. 30 capsule 5   losartan (COZAAR) 100 MG tablet TAKE 1 TABLET BY MOUTH EVERY DAY 90 tablet 1   MAGNESIUM PO Take 400 mg by mouth daily.      meclizine (ANTIVERT) 25 MG tablet Take 1 tablet (25 mg total) by mouth 3 (three) times daily as needed for dizziness or nausea. 30 tablet 1   Multiple Vitamins-Minerals (MULTIVITAMIN PO) Take 1 tablet by mouth daily. Alive for Woman 50 +      Omega-3 Fatty Acids (FISH OIL PO) Take 400 mg by mouth daily.     ondansetron (ZOFRAN) 8 MG tablet 1 tablet taken 3 times daily as needed 18 tablet 2   pilocarpine (SALAGEN) 5 MG tablet TAKE 1 TABLET BY MOUTH 3 TIMES DAILY AS NEEDED. 270 tablet 0   TURMERIC PO Take 375 mg by mouth daily.     No current facility-administered medications on file prior to visit.    Allergies:  Allergies  Allergen Reactions   Miralax [Polyethylene Glycol] Swelling    Tongue swelling   Vasotec [Enalapril]     cough    Vital Signs:  BP 136/86   Pulse 80   Ht 4' 11.5" (1.511 m)   Wt 190 lb (86.2 kg)   LMP 12/18/2023   SpO2 98%   BMI 37.73 kg/m   Neurological Exam: MENTAL STATUS including orientation to time, place, person, recent and remote memory, attention span and concentration, language, and fund of knowledge is normal.  Speech is not dysarthric.  CRANIAL NERVES:   Normal conjugate, extra-ocular eye movements in all directions of gaze.  No ptosis .    MOTOR:  Motor strength is 5/5 in all extremities, except mild bilateral hand weakness 4/5 and toe extension and flexion weakness 4/5.  Mild bilateral hand atrophy, no fasciculations or abnormal movements.  No pronator drift.  Tone is normal.    MSRs:  Reflexes are 2+/4 throughout, except 1+ in the legs.  SENSORY:  Vibration is slightly less on the entire left side, as compared to the right.  Pinprick,and temperature is intact throughout. Rhomberg testing is negative  COORDINATION/GAIT:   Gait narrow based and stable. Stressed and tandem gait intact  Data: Lab Results  Component Value Date   TSH 1.14 07/03/2020   MRI brain wo contrast 07/31/2018: Normal noncontrast MRI appearance of the brain aside from partially empty sella, which can be a normal anatomic variant but is also associated with idiopathic intracranial hypertension (pseudotumor cerebri). CSF opening pressure measurement would evaluate further.   Thank you for allowing me to  participate in patient's care.  If I can answer any additional questions, I would be pleased to do so.    Sincerely,    Nkechi Linehan K. Allena Katz, DO

## 2024-01-20 ENCOUNTER — Encounter (INDEPENDENT_AMBULATORY_CARE_PROVIDER_SITE_OTHER): Payer: Self-pay | Admitting: Gastroenterology

## 2024-03-03 ENCOUNTER — Other Ambulatory Visit: Payer: Self-pay | Admitting: Family Medicine

## 2024-03-19 ENCOUNTER — Other Ambulatory Visit: Payer: Self-pay | Admitting: Internal Medicine

## 2024-03-19 DIAGNOSIS — E7841 Elevated Lipoprotein(a): Secondary | ICD-10-CM

## 2024-03-19 DIAGNOSIS — E7849 Other hyperlipidemia: Secondary | ICD-10-CM

## 2024-03-19 DIAGNOSIS — R079 Chest pain, unspecified: Secondary | ICD-10-CM

## 2024-03-29 ENCOUNTER — Encounter: Payer: Self-pay | Admitting: Family Medicine

## 2024-03-31 ENCOUNTER — Encounter: Payer: Self-pay | Admitting: Family Medicine

## 2024-03-31 ENCOUNTER — Ambulatory Visit (INDEPENDENT_AMBULATORY_CARE_PROVIDER_SITE_OTHER): Admitting: Family Medicine

## 2024-03-31 VITALS — BP 131/81 | HR 75 | Temp 98.4°F | Ht 59.0 in | Wt 192.0 lb

## 2024-03-31 DIAGNOSIS — E785 Hyperlipidemia, unspecified: Secondary | ICD-10-CM

## 2024-03-31 DIAGNOSIS — R5383 Other fatigue: Secondary | ICD-10-CM

## 2024-03-31 DIAGNOSIS — L659 Nonscarring hair loss, unspecified: Secondary | ICD-10-CM

## 2024-03-31 DIAGNOSIS — R635 Abnormal weight gain: Secondary | ICD-10-CM

## 2024-03-31 MED ORDER — TIRZEPATIDE-WEIGHT MANAGEMENT 2.5 MG/0.5ML ~~LOC~~ SOAJ: 2.5000 mg | SUBCUTANEOUS | 4 refills | Status: DC

## 2024-03-31 NOTE — Progress Notes (Signed)
 Subjective:    Patient ID: Brittney [name], female    DOB: 06/01/1971, 53 y.o.   MRN: [id number]  HPI hypertension f/u   New med request- would like to discuss weight loss medication  Discussed the use of AI scribe software for clinical note transcription with the patient, who gave verbal consent to proceed.  History of Present Illness   Brittney [name] is a 53 year old female who presents with weight gain and menopausal symptoms.  She has experienced significant weight gain, which she attributes to menopause and stress-related eating. Despite efforts to manage her weight through dietary changes and calorie restriction, she has not been successful. She has tried various weight management programs, including Weight Watchers and a dietitian-led program, but has regained weight. She is interested in trying Zepbound, a GLP-1 medication, for weight management.  She experiences menopausal symptoms such as night sweats, hot flashes, and mood changes. She was on hormone replacement therapy from July to December of the previous year, which led to weight gain and significant hair loss. She has since stopped the therapy and is using Brittney [name] vitamins and castor oil to manage hair loss, which has slowed down.  She reports low energy levels, feeling tired and fatigued, and experiences muscle and joint discomfort. She has difficulty with coordination, such as dropping things and issues with leg movement. She has been on the treadmill three times in the past three weeks and did a full body workout recently, but finds walking painful. She is considering aquatic exercises to reduce joint stress.  She has a history of Charcot and Sjogren's syndrome, with symptoms of dry eyes and coordination issues. She experiences increased urination at night due to dry mouth from snoring, which disrupts her sleep, leading to daytime fatigue. A sleep study has not been conducted previously.  She has a history of chest palpitations  and elevated lipoprotein A, for which she saw a cardiologist. A heart monitor test was negative, and she did not proceed with further imaging due to cost. She has tried Crestor  and Repatha  for cholesterol management but experienced adverse effects such as muscle pain and back pain, leading her to discontinue these medications.      Patient does the best she can with healthy eating She tries to restrict her calories She is unable to do a lot of exercise because of her underlying chronic health disease She has [name] weight watchers, she has tracked her calories on a calf She was under the care of of psychologist for weight reduction counseling, she is also [name] NOOM none of these led to meaningful weight loss that has stayed off Her weight going up is increased her pain and discomfort with her joints and muscles related to her Charcot-Marie-Tooth  Review of Systems     Objective:   Physical Exam General-in no acute distress Eyes-no discharge Lungs-respiratory rate normal, CTA CV-no murmurs,RRR Extremities skin warm dry no edema Neuro grossly normal Behavior normal, alert        Assessment & Plan:  1. Weight gain (Primary) Distressing to her as she has tried to restrict calories and exercise but her weight keeps going up her BMI is currently 38.78  2. Hair loss Mild hair loss this is evened out but she relates that she has had previous evaluation by another provider and states that she did have a thyroid  test completed  3. Other fatigue She is trying to increase exercise  4. Hyperlipidemia, unspecified hyperlipidemia type She was seen by cardiology  they recommended some testing but she did not get these [name] because of the cost She denies any chest tightness pressure pain She was unable to tolerate statins unable to tolerate Repatha  - CT CARDIAC SCORING (SELF PAY ONLY)  5. Morbid obesity (HCC) Portion control regular physical activity See discussion above Based on failed  attempts of multiple legitimate attempts to lose weight she needs indication for Zepbound start off at 2.5 No family history of medullary thyroid  cancer Side effects were discussed in detail patient is open to trying the medication Follow-up within 4 to 6 months  Assessment and Plan    Obesity Obesity with difficulty losing weight despite calorie restriction and exercise. Previous hormonal therapy led to weight gain. Considering Zepbound for weight management. Discussed side effects and prior authorization process. - Submit prior authorization for Zepbound. - Educate on potential side effects of Zepbound, including nausea, vomiting, and abdominal pain. - Encourage continued calorie restriction and physical activity as tolerated. - Consider aquatic exercises to reduce joint stress.  Charcot joint disease Charcot joint disease with coordination issues and joint discomfort. Physical activity limited due to joint pain. Aquatic exercises considered to minimize joint stress. - Encourage low-impact exercises such as aquatic therapy to minimize joint stress.  Elevated lipoprotein(a) Elevated lipoprotein(a) indicating higher cardiovascular disease risk. Previous treatments not tolerated. Discussed coronary calcium  scan as a non-invasive screening tool. Explained cost and potential need for further testing based on results. - Order coronary calcium  scan. - Educate on the purpose and process of the coronary calcium  scan. - Discuss potential outcomes of the coronary calcium  scan and implications for further testing.  Fatigue Chronic fatigue with potential sleep apnea. Discussed sleep study to evaluate for sleep apnea. Nocturia may contribute to sleep disruption. - Consider sleep study to evaluate for sleep apnea.  Menopausal symptoms Menopausal symptoms including night sweats, hot flashes, mood changes, and weight gain. Previous hormonal therapy not tolerated. Currently using vitamins and castor oil  for hair loss. - Discuss non-hormonal options for managing menopausal symptoms.  Sjogren's syndrome Sjogren's syndrome with dry eyes. Recommended ophthalmology referral for management. Previous rheumatology consultations were unsatisfactory. - Refer to ophthalmology for management of dry eyes.

## 2024-04-01 ENCOUNTER — Telehealth: Payer: Self-pay | Admitting: Pharmacy Technician

## 2024-04-01 ENCOUNTER — Other Ambulatory Visit (HOSPITAL_COMMUNITY): Payer: Self-pay

## 2024-04-01 NOTE — Telephone Encounter (Signed)
 Pharmacy Patient Advocate Encounter   Received notification from CoverMyMeds that prior authorization for Zepbound  2.5MG /0.5ML pen-injectors is required/requested.   Insurance verification completed.   The patient is insured through [facility] .   Per test claim: PA required; PA submitted to above mentioned insurance via CoverMyMeds Key/confirmation #/EOC [id number] Status is pending

## 2024-04-01 NOTE — Telephone Encounter (Signed)
 Pharmacy Patient Advocate Encounter  Received notification from [facility] that Prior Authorization for Zepbound  2.5MG /0.5ML pen-injectors has been APPROVED from 04/01/2024 to 11/27/2024. Unable to obtain price due to refill too soon rejection, last fill date 04/01/2024 next available fill date05/23/2025.   PA #/Case ID/Reference #: Key: [id number]

## 2024-05-10 ENCOUNTER — Ambulatory Visit (HOSPITAL_COMMUNITY)
Admission: RE | Admit: 2024-05-10 | Discharge: 2024-05-10 | Disposition: A | Discharge status: Home / Self Care | Payer: Self-pay | Source: Ambulatory Visit | Attending: Family Medicine | Admitting: Family Medicine

## 2024-05-10 DIAGNOSIS — E785 Hyperlipidemia, unspecified: Secondary | ICD-10-CM | POA: Insufficient documentation

## 2024-05-11 ENCOUNTER — Ambulatory Visit: Payer: Self-pay | Admitting: Family Medicine

## 2024-05-12 ENCOUNTER — Telehealth: Payer: Self-pay | Admitting: Family Medicine

## 2024-05-12 MED ORDER — AZITHROMYCIN 250 MG PO TABS: ORAL_TABLET | ORAL | 0 refills | Status: AC

## 2024-05-12 MED ORDER — AMOXICILLIN-POT CLAVULANATE 875-125 MG PO TABS: 1.0000 | ORAL_TABLET | Freq: Two times a day (BID) | ORAL | 0 refills | Status: DC

## 2024-05-12 NOTE — Telephone Encounter (Signed)
 I talked with patient on the phone She has pneumonia on her CT scan We are starting her on dual antibiotics She needs a follow-up within 10 to 14 days with me  If possible lets get her in a same-day slot for next week for me to see her thank you

## 2024-05-13 ENCOUNTER — Other Ambulatory Visit: Payer: Self-pay | Admitting: Family Medicine

## 2024-05-13 ENCOUNTER — Telehealth: Payer: Self-pay | Admitting: Family Medicine

## 2024-05-13 DIAGNOSIS — M791 Myalgia, unspecified site: Secondary | ICD-10-CM

## 2024-05-13 NOTE — Telephone Encounter (Signed)
 Patient scheduled.

## 2024-05-13 NOTE — Telephone Encounter (Signed)
 Nurses-please order CK due to myalgias patient will do these labs in the near future she is aware thank you

## 2024-05-14 ENCOUNTER — Other Ambulatory Visit: Payer: Self-pay | Admitting: Family Medicine

## 2024-05-14 ENCOUNTER — Other Ambulatory Visit: Payer: Self-pay | Admitting: *Deleted

## 2024-05-14 DIAGNOSIS — Z79899 Other long term (current) drug therapy: Secondary | ICD-10-CM

## 2024-05-14 DIAGNOSIS — M3501 Sicca syndrome with keratoconjunctivitis: Secondary | ICD-10-CM

## 2024-05-14 NOTE — Telephone Encounter (Signed)
 done

## 2024-05-14 NOTE — Addendum Note (Signed)
 Addended by: Adrianne Horn on: 05/14/2024 01:54 PM   Modules accepted: Orders

## 2024-05-14 NOTE — Addendum Note (Signed)
 Addended by: Adrianne Horn on: 05/14/2024 01:53 PM   Modules accepted: Orders

## 2024-05-15 ENCOUNTER — Ambulatory Visit: Payer: Self-pay | Admitting: Family Medicine

## 2024-05-15 LAB — CK: Total CK: 180 U/L (ref 32–182)

## 2024-05-16 ENCOUNTER — Ambulatory Visit: Payer: Self-pay | Admitting: Physician Assistant

## 2024-05-16 LAB — SJOGRENS SYNDROME-B EXTRACTABLE NUCLEAR ANTIBODY: ENA SSB (LA) Ab: <0.2 AI (ref 0.0–0.9)

## 2024-05-16 NOTE — Progress Notes (Signed)
 ANA pending  SPEP pending   No proteinuria.   Ro antibody remains positive. La antibody negative.   CMP WNL.  CBC WNL Complements WNL ESR WNL. RF negative.

## 2024-05-17 ENCOUNTER — Other Ambulatory Visit: Payer: Self-pay

## 2024-05-17 MED ORDER — LOSARTAN POTASSIUM 100 MG PO TABS: 100.0000 mg | ORAL_TABLET | Freq: Every day | ORAL | 6 refills | Status: DC

## 2024-05-18 ENCOUNTER — Encounter: Payer: Self-pay | Admitting: Family Medicine

## 2024-05-18 ENCOUNTER — Telehealth: Payer: Self-pay | Admitting: Family Medicine

## 2024-05-18 ENCOUNTER — Ambulatory Visit: Admitting: Family Medicine

## 2024-05-18 DIAGNOSIS — R931 Abnormal findings on diagnostic imaging of heart and coronary circulation: Secondary | ICD-10-CM | POA: Diagnosis not present

## 2024-05-18 DIAGNOSIS — E785 Hyperlipidemia, unspecified: Secondary | ICD-10-CM | POA: Diagnosis not present

## 2024-05-18 DIAGNOSIS — G600 Hereditary motor and sensory neuropathy: Secondary | ICD-10-CM

## 2024-05-18 MED ORDER — TIRZEPATIDE-WEIGHT MANAGEMENT 5 MG/0.5ML ~~LOC~~ SOLN: 5.0000 mg | SUBCUTANEOUS | 4 refills | Status: DC

## 2024-05-18 NOTE — Telephone Encounter (Signed)
 Nurses Please order lipid, liver, CK Diagnosis hyperlipidemia, high risk medication, myalgia, patient is aware of these test being ordered

## 2024-05-18 NOTE — Progress Notes (Signed)
 Subjective:    Patient ID: Brittney [name], female    DOB: [birth year], 53 y.o.   MRN: [id number]  HPI pneumonia follow up  Still coughing some  Recently had a coronary calcium  test which showed some calcifications but not in a high numeric range.  From a percentile standpoint compared to the rest of the population of her age it was high but it is not in a dangerous range we did discuss how it would be to her advantage to get her cholesterol down and if possible LDL below 70 Patient back in November was prescribed rosuvastatin  and the had significant myalgias with this and could not tolerate the medicine Then she was prescribed Repatha  this spring and she had muscle aches and discomforts  She also has Charcot-Marie-Tooth syndrome/disease-which makes it difficult for her to tolerate cholesterol medicines  Patient does not smoke she does have a strong family history of premature coronary artery disease her mom died in the early 53s with heart disease Patient has had some decline with her weight but not dramatic she does state that she has had a decrease in appetite.  She also relates that her energy level is doing okay.  Minimal nausea.  Tolerating medicine well. Discussed the use of AI scribe software for clinical note transcription with the patient, who gave verbal consent to proceed.  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  She has recently modified her exercise routine, shifting from weightlifting to a 30-day Pilates program and treadmill walking due to excessive pain from weights. She is contemplating adding yoga and body weight workouts to her regimen.  She is currently taking Zepbound  for weight management, which has resulted in a weight loss of about five to six pounds and a noticeable difference in clothing fit. She manages her hunger better and is not  interested in increasing the dose at this time. Her diet includes yogurt and berries for breakfast and homemade vegetable pizza for lunch, with smaller portion sizes.  She has a history of coronary artery calcium  buildup and has previously experienced adverse effects from cholesterol medications, including rosuvastatin  and Repatha , which caused muscle pain and weakness.  Her family history is significant for her mother passing away from a heart attack at the age of 30, contributing to her concern about cardiovascular health.  She mentions infrequent consumption of red meat, preferring chicken and ground [place], and is open to further dietary adjustments to manage cholesterol levels.     Coronary Artery Calcium  Buildup Coronary calcium  score in the 93rd percentile indicates elevated risk for future coronary artery disease. Current buildup is 10-20%, asymptomatic. Low risk of myocardial infarction in the next 3-5 years, increasing over 10 years without intervention. Family history exacerbates risk. - Discussed Leqvio with clinical pharmacist. Initial dose, then at 3 months, and every 6 months. - Consider lipid clinic referral if insurance denies newer medications. - Advised dietary modifications: reduce red meat, increase non-starchy vegetables. - Regular monitoring of coronary calcium  score and cardiovascular risk factors.  Hyperlipoproteinemia Elevated lipoprotein A levels increase risk of cholesterol-related arterial damage. Intolerance to rosuvastatin  and Repatha  due to myalgia. Leqvio may be better tolerated with a 5% risk of myalgia. - Consulted clinical pharmacist about Leqvio for cholesterol management. - Consider lipid clinic referral for specialized management if needed. - Monitor lipid levels and cardiovascular risk factors.  Weight Management Currently on Zepbound ,  resulting in 5-6 pound weight loss. Improved control over eating habits and satisfaction with current dose. Discussed  potential dose increase if plateau occurs, but prefers current dose for another month. - Continue current Zepbound  dose. - Reassess dose adjustment in one month based on progress and satisfaction.  General Health Maintenance Engages in regular physical activity, including Pilates and treadmill walking, beneficial for overall health and aging. - Encourage continuation of Pilates and treadmill walking. - Advise balanced diet with emphasis on non-starchy vegetables and reduced red meat intake.  Follow-up Follow-up planned to evaluate treatment efficacy and potential new interventions based on clinical pharmacist's recommendations. - Follow up in one month to reassess weight management and Zepbound  dose. - Await feedback from clinical pharmacist regarding Leqvio and potential lipid clinic referral.        Review of Systems     Objective:   Physical Exam  Today's visit was mainly discussion      Assessment & Plan:  1. Morbid obesity (HCC) (Primary) She is doing well with healthy eating She is taking Zepbound  She does want to go up on the dose.  She states that 5 mg would be.  She will give us  feedback as below.  She will follow-up within 6 months. She states that she will focusing on healthy eating regular activity  2. CMT (Charcot-Marie-Tooth disease) Stable currently but this does affect what medication she can tolerate Her system could not tolerate statins could not tolerate Repatha   3. Hyperlipidemia, unspecified hyperlipidemia type  her lipoprotein a was 188 this is elevated Previous LDL was 114 4. Elevated coronary artery calcium  score 93rd percentile Total number was 50.9 She is at increased risk of premature heart disease because of her underlying risk factors as well as coronary calcium  It is reasonable to consider putting her on the newer medicine but we will need to run this by clinical pharmacy as well as not to pursue getting it approved Patient first would like  to know whether or not the medication is We will also make sure you are up-to-date lipid liver is ordered before starting any new treatment I have sent a message to clinical pharmacy for their input

## 2024-05-18 NOTE — Progress Notes (Signed)
 No protein in urine noted. Ro antibody remains positive.   CBC and CMP WNL Complements WNL.   RF negative  La antibody negative. SPEP normal.

## 2024-05-19 ENCOUNTER — Other Ambulatory Visit: Payer: Self-pay

## 2024-05-19 DIAGNOSIS — M791 Myalgia, unspecified site: Secondary | ICD-10-CM

## 2024-05-19 DIAGNOSIS — E7849 Other hyperlipidemia: Secondary | ICD-10-CM

## 2024-05-19 DIAGNOSIS — Z79899 Other long term (current) drug therapy: Secondary | ICD-10-CM

## 2024-05-19 NOTE — Telephone Encounter (Signed)
 Labs have been placed

## 2024-05-20 LAB — CMP14+EGFR
ALT: 23 IU/L (ref 0–32)
AST: 24 IU/L (ref 0–40)
Albumin: 4.5 g/dL (ref 3.8–4.9)
Alkaline Phosphatase: 62 IU/L (ref 44–121)
BUN/Creatinine Ratio: 12 (ref 9–23)
BUN: 8 mg/dL (ref 6–24)
Bilirubin Total: 0.2 mg/dL (ref 0.0–1.2)
CO2: 26 mmol/L (ref 20–29)
Calcium: 9.7 mg/dL (ref 8.7–10.2)
Chloride: 96 mmol/L (ref 96–106)
Creatinine, Ser: 0.66 mg/dL (ref 0.57–1.00)
Globulin, Total: 2.7 g/dL (ref 1.5–4.5)
Glucose: 104 mg/dL — ABNORMAL HIGH (ref 70–99)
Potassium: 3.6 mmol/L (ref 3.5–5.2)
Sodium: 138 mmol/L (ref 134–144)
Total Protein: 7.2 g/dL (ref 6.0–8.5)
eGFR: 104 mL/min/{1.73_m2} (ref >59)

## 2024-05-20 LAB — PROTEIN ELECTROPHORESIS, SERUM, WITH REFLEX
A/G Ratio: 1.1 (ref 0.7–1.7)
Albumin ELP: 3.8 g/dL (ref 2.9–4.4)
Alpha 1: 0.3 g/dL (ref 0.0–0.4)
Alpha 2: 0.9 g/dL (ref 0.4–1.0)
Beta: 1.1 g/dL (ref 0.7–1.3)
Gamma Globulin: 1.1 g/dL (ref 0.4–1.8)
Globulin, Total: 3.4 g/dL (ref 2.2–3.9)

## 2024-05-20 LAB — CBC WITH DIFFERENTIAL/PLATELET
Basophils Absolute: 0.1 10*3/uL (ref 0.0–0.2)
Basos: 1 %
EOS (ABSOLUTE): 0.1 10*3/uL (ref 0.0–0.4)
Eos: 3 %
Hematocrit: 37.9 % (ref 34.0–46.6)
Hemoglobin: 12.5 g/dL (ref 11.1–15.9)
Immature Grans (Abs): 0 10*3/uL (ref 0.0–0.1)
Immature Granulocytes: 0 %
Lymphocytes Absolute: 2 10*3/uL (ref 0.7–3.1)
Lymphs: 39 %
MCH: 30.4 pg (ref 26.6–33.0)
MCHC: 33 g/dL (ref 31.5–35.7)
MCV: 92 fL (ref 79–97)
Monocytes Absolute: 0.3 10*3/uL (ref 0.1–0.9)
Monocytes: 7 %
Neutrophils Absolute: 2.6 10*3/uL (ref 1.4–7.0)
Neutrophils: 50 %
Platelets: 307 10*3/uL (ref 150–450)
RBC: 4.11 x10E6/uL (ref 3.77–5.28)
RDW: 13 % (ref 11.7–15.4)
WBC: 5.1 10*3/uL (ref 3.4–10.8)

## 2024-05-20 LAB — FANA STAINING PATTERNS: CENTROMERE AB: 1:80 {titer}

## 2024-05-20 LAB — ANTINUCLEAR ANTIBODIES, IFA: ANA Titer 1: POSITIVE — AB

## 2024-05-20 LAB — PROTEIN / CREATININE RATIO, URINE
Creatinine, Urine: 24.1 mg/dL
Protein, Ur: <4 mg/dL

## 2024-05-20 LAB — SJOGRENS SYNDROME-A EXTRACTABLE NUCLEAR ANTIBODY: ENA SSA (RO) Ab: 1.1 AI — ABNORMAL HIGH (ref 0.0–0.9)

## 2024-05-20 LAB — C3 AND C4
Complement C3, Serum: 160 mg/dL (ref 82–167)
Complement C4, Serum: 35 mg/dL (ref 12–38)

## 2024-05-20 LAB — RHEUMATOID FACTOR: Rheumatoid fact SerPl-aCnc: <10 [IU]/mL (ref <14.0)

## 2024-05-20 LAB — SEDIMENTATION RATE: Sed Rate: 40 mm/h (ref 0–40)

## 2024-05-20 NOTE — Progress Notes (Signed)
 ANA remains positive.

## 2024-06-02 LAB — HEPATIC FUNCTION PANEL
ALT: 29 IU/L (ref 0–32)
AST: 27 IU/L (ref 0–40)
Albumin: 4.8 g/dL (ref 3.8–4.9)
Alkaline Phosphatase: 62 IU/L (ref 44–121)
Bilirubin Total: 0.3 mg/dL (ref 0.0–1.2)
Bilirubin, Direct: 0.11 mg/dL (ref 0.00–0.40)
Total Protein: 7.8 g/dL (ref 6.0–8.5)

## 2024-06-02 LAB — LIPID PANEL
Chol/HDL Ratio: 3.5 ratio (ref 0.0–4.4)
Cholesterol, Total: 201 mg/dL — ABNORMAL HIGH (ref 100–199)
HDL: 57 mg/dL (ref >39)
LDL Chol Calc (NIH): 128 mg/dL — ABNORMAL HIGH (ref 0–99)
Triglycerides: 90 mg/dL (ref 0–149)
VLDL Cholesterol Cal: 16 mg/dL (ref 5–40)

## 2024-06-02 LAB — CK: Total CK: 142 U/L (ref 32–182)

## 2024-06-03 ENCOUNTER — Ambulatory Visit: Payer: Self-pay | Admitting: Family Medicine

## 2024-06-11 ENCOUNTER — Encounter: Payer: Self-pay | Admitting: Family Medicine

## 2024-06-11 ENCOUNTER — Telehealth: Payer: Self-pay | Admitting: Pharmacist

## 2024-06-13 NOTE — Telephone Encounter (Signed)
 Nurses I read through Brittney [name]'s message Because of her insurance shifting coverage we will need to discontinue Zepbound  In its place we will go me We have to start at the lowest dose when starting a new medicine even though she is use to Zepbound  Therefore start off with Wegovy  0.25 mg once a week Send in 4-week supply with 2 refills Once this gets approved then Brittney [name] can let us  know how well she is tolerating it every 4 weeks we can go up on the dose as well as long as it is being tolerated  Keep all regular follow-up visits  (Should be noted that Wegovy  starts at 0.25 mg, the next dose is 0.5 mg Then it goes up to a maximum of 2.4 mg/week)  You may share this message with Brittney [name] thank you She can send us  an update in approximately 3 to 4 weeks after starting the newer medicine

## 2024-06-14 ENCOUNTER — Ambulatory Visit (INDEPENDENT_AMBULATORY_CARE_PROVIDER_SITE_OTHER): Payer: 59 | Admitting: Gastroenterology

## 2024-06-15 ENCOUNTER — Other Ambulatory Visit: Payer: Self-pay

## 2024-06-15 MED ORDER — SEMAGLUTIDE-WEIGHT MANAGEMENT 0.25 MG/0.5ML ~~LOC~~ SOAJ: 0.2500 mg | Freq: Once | SUBCUTANEOUS | Status: DC

## 2024-06-17 ENCOUNTER — Other Ambulatory Visit: Payer: Self-pay

## 2024-06-17 MED ORDER — WEGOVY 0.25 MG/0.5ML ~~LOC~~ SOAJ
0.2500 mg | SUBCUTANEOUS | 2 refills | Status: DC
Start: 2024-06-17 — End: 2024-08-07

## 2024-06-17 NOTE — Telephone Encounter (Signed)
 06/17/2024 Name: Brittney [name] MRN: [id number] DOB: 1971/01/04  Chief Complaint  Patient presents with   Hyperlipidemia   Medication Management    Brittney [name] is a 53 y.o. year old female who presented for a telephone visit.   They were referred to the pharmacist by their PCP for assistance in managing hyperlipidemia.    Subjective:  Care Team: Primary Care Provider: Alphonsa Glendia LABOR, MD    Medication Access/Adherence  [phone] [place] [place] - [address] AT [facility] [address] [place] [address] Phone: [phone] Fax: [phone]   Patient reports affordability concerns with their medications: No  Patient reports access/transportation concerns to their pharmacy: No  Patient reports adherence concerns with their medications:  No  statin myopathy   Hyperlipidemia/ASCVD Risk Reduction  Current lipid lowering medications: none Medications tried in the past: rosuvastatin , Repatha , atorvastatin ,  Antiplatelet regimen:   ASCVD History: n/a Family History: early CAD History as provided by PCP & patient: --patient has Charcot-Marie-Tooth syndrome (may contribute to muscle symptoms)  --LDL 128 (goal <70) --elevated lp(a) --elevated CAC score --She tried rosuvastatin  and then November of last year and had myalgias and muscle weakness including in her legs  --She tried Repatha  and had severe back muscle pains   Current physical activity: encouraged as able  Current medication access support: will attempt to use Leqvio copay card  Clinical ASCVD: No  The 10-year ASCVD risk score (Arnett DK, et al., 2019) is: 4.8%   Values used to calculate the score:     Age: 9 years     Clincally relevant sex: Female     Is Non-Hispanic African American: Yes     Diabetic: No     Tobacco smoker: No     Systolic Blood Pressure: 131 mmHg     Is BP treated: Yes     HDL Cholesterol: 57 mg/dL     Total Cholesterol: 201  mg/dL   Objective:  Lab Results  Component Value Date   HGBA1C 5.6 10/28/2018    Lab Results  Component Value Date   CREATININE 0.66 05/14/2024   BUN 8 05/14/2024   NA 138 05/14/2024   K 3.6 05/14/2024   CL 96 05/14/2024   CO2 26 05/14/2024    Lab Results  Component Value Date   CHOL 201 (H) 06/01/2024   HDL 57 06/01/2024   LDLCALC 128 (H) 06/01/2024   TRIG 90 06/01/2024   CHOLHDL 3.5 06/01/2024    Medications Reviewed Today     Reviewed by Billee Mliss BIRCH, [facility] (Pharmacist) on 06/17/24 at 1548  Med List Status: <None>   Medication Order Taking? Sig Documenting Provider Last Dose Status Informant  amoxicillin -clavulanate (AUGMENTIN ) 875-125 MG tablet [id number]  Take 1 tablet by mouth 2 (two) times daily. Brittney Glendia LABOR, MD  Active   Berberine Chloride (BERBERINE HCI PO) [id number]  Take 1,200 mg by mouth daily. [provider]  Active   Cholecalciferol (VITAMIN D ) 125 MCG (5000 UT) CAPS [id number]  Take 5,000 Units by mouth daily. [provider]  Active Self  Collagen 500 MG CAPS [id number]  Take by mouth daily. Bone Broth  Scoop = 15 grams [provider]  Active Self  linaclotide  (LINZESS ) 290 MCG CAPS capsule [id number]  Take 1 capsule (290 mcg total) by mouth daily before breakfast. Carlan, Chelsea L, NP  Active   losartan  (COZAAR ) 100 MG tablet [id number]  Take 1 tablet (100  mg total) by mouth daily. Brittney Glendia LABOR, MD  Active   MAGNESIUM PO [id number]  Take 400 mg by mouth daily.  [provider]  Active Self  meclizine  (ANTIVERT ) 25 MG tablet [id number]  Take 1 tablet (25 mg total) by mouth 3 (three) times daily as needed for dizziness or nausea. Brittney Glendia LABOR, MD  Active   Multiple Vitamins-Minerals (MULTIVITAMIN PO) [id number]  Take 1 tablet by mouth daily. Alive for Woman 83 + [provider]  Active Self  Omega-3 Fatty Acids (FISH OIL PO) [id number]  Take 400 mg by mouth daily. [provider]  Active Self   ondansetron  (ZOFRAN ) 8 MG tablet [id number]  1 tablet taken 3 times daily as needed Luking, Glendia LABOR, MD  Active   pilocarpine  (SALAGEN ) 5 MG tablet [id number]  TAKE 1 TABLET BY MOUTH 3 TIMES DAILY AS NEEDED. Dolphus Reiter, MD  Active   Semaglutide -Weight Management (WEGOVY ) 0.25 MG/0.5ML SOAJ [id number]  Inject 0.25 mg into the skin once a week. Brittney Glendia LABOR, MD  Active   tirzepatide  5 MG/0.5ML injection vial [id number]  Inject 5 mg into the skin once a week. Brittney Glendia LABOR, MD  Active   TURMERIC PO [id number]  Take 375 mg by mouth daily. [provider]  Active Self             Assessment/Plan:   Hyperlipidemia/ASCVD Risk Reduction: - Currently uncontrolled.  - Reviewed long term complications of uncontrolled cholesterol - Reviewed dietary recommendations including FOLLOWING A HEART HEALTHY DIET/HEALTHY PLATE METHOD - Reviewed lifestyle recommendations including increased physical activity - Recommend to : Start benefits investigation for Leqvio (this will be given at [facility] infusion if approved; 1st injection, follow by 3 month injection, then to a maintenance dose of every 6 months Dietary/lifestyle improvements    Follow Up Plan: 2-3 weeks once Leqvio benefits as available   Mliss Tarry Griffin, PharmD, BCACP, CPP Clinical Pharmacist, [facility]

## 2024-06-22 ENCOUNTER — Ambulatory Visit (INDEPENDENT_AMBULATORY_CARE_PROVIDER_SITE_OTHER): Payer: 59 | Admitting: Gastroenterology

## 2024-07-13 ENCOUNTER — Encounter: Payer: Self-pay | Admitting: Family Medicine

## 2024-07-13 NOTE — Telephone Encounter (Signed)
 Nurses Please order Wegovy  0.5 mg once weekly for 4 weeks, 2 refills  You may send the following message to Brittney [name] as well Thanks-Dr. Glendia Randie Brittney [name]  I agree with you Mounjaro /Zepbound  does a better job in regards to weight reduction but studies show Wegovy  for a significant portion of people at the higher doses can still result with significant weight reduction.  Next step 0.5 mg once weekly for the next 4 weeks then send me an update around week 3-week for if you are able to tolerate the side effects we will move forward at that point in time to 1 mg  After that the next step would be 1.7 mg then finally 2.4 mg is the maximum dose  Some people are able to progress every 4 weeks with this type of regimen other folks sometimes have to take 2 to 3 months at each stage to get used to it before going to the next stage  Please keep us  updated on a regular basis-it would also be wise for us  to set you up for a follow-up visit with myself or Elveria Quarry our nurse practitioner in order to document that you are making some progress-this particular follow-up does not need to be until another 4 months-our staff will connect with you to help set this up-but once again please keep us  updated as stated above  Please take care-Dr. Glendia

## 2024-07-15 ENCOUNTER — Other Ambulatory Visit: Payer: Self-pay

## 2024-07-15 MED ORDER — WEGOVY 0.5 MG/0.5ML ~~LOC~~ SOAJ: 0.5000 mg | SUBCUTANEOUS | 2 refills | Status: DC

## 2024-07-22 ENCOUNTER — Ambulatory Visit: Admitting: Family Medicine

## 2024-07-28 ENCOUNTER — Telehealth: Payer: Self-pay | Admitting: Pharmacist

## 2024-07-28 DIAGNOSIS — E7801 Familial hypercholesterolemia: Secondary | ICD-10-CM

## 2024-07-28 DIAGNOSIS — E785 Hyperlipidemia, unspecified: Secondary | ICD-10-CM | POA: Insufficient documentation

## 2024-07-28 NOTE — Telephone Encounter (Signed)
 Leqvio orders sent to PCP for cosign under infusion navigator Leqvio service center form filled out and faxed to [facility] service center Patient should qualify for copay card given commercial insurance Can you check on form to make sure [facility] service center has processed?

## 2024-07-29 ENCOUNTER — Telehealth: Payer: Self-pay

## 2024-07-29 NOTE — Telephone Encounter (Addendum)
 Auth Submission: APPROVED Site of care: Site of care: [facility] Payer: [facility] medicare Medication & CPT/J Code(s) submitted: Leqvio  (Inclisiran) J1306 Diagnosis Code:  Route of submission (phone, fax, portal): portal Phone # Fax # Auth type: Buy/Bill PB Units/visits requested: 284mg  x 3 doses Reference number: [id number] Approval from: 07/29/24 to 07/29/25   Patient has been approved for leqvio  co-pay card  Card #: [id number] BIN: [id number] Group: [id number] PCN: [id number]

## 2024-08-05 ENCOUNTER — Encounter: Payer: Self-pay | Admitting: Family Medicine

## 2024-08-07 ENCOUNTER — Other Ambulatory Visit: Payer: Self-pay | Admitting: Family Medicine

## 2024-08-07 MED ORDER — WEGOVY 1 MG/0.5ML ~~LOC~~ SOAJ: SUBCUTANEOUS | 3 refills | Status: DC

## 2024-08-10 ENCOUNTER — Encounter: Attending: Family Medicine | Admitting: Internal Medicine

## 2024-08-10 VITALS — BP 153/88 | HR 76 | Temp 98.2°F | Resp 18

## 2024-08-10 DIAGNOSIS — E7801 Familial hypercholesterolemia: Secondary | ICD-10-CM

## 2024-08-10 DIAGNOSIS — E785 Hyperlipidemia, unspecified: Secondary | ICD-10-CM

## 2024-08-10 MED ORDER — INCLISIRAN SODIUM 284 MG/1.5ML ~~LOC~~ SOSY
284.0000 mg | PREFILLED_SYRINGE | Freq: Once | SUBCUTANEOUS | Status: AC
  Administered 2024-08-10: 284 mg via SUBCUTANEOUS

## 2024-08-10 NOTE — Progress Notes (Signed)
 Diagnosis: Hyperlipidemia  Provider:  Alphonsa Hamilton MD  Procedure: Injection  Leqvio  (inclisiran), Dose: 284 mg, Site: subcutaneous, Number of injections: 1  Injection Site(s): Right arm  Post Care: Observation period completed  Discharge: Condition: Good, Destination: Home . AVS Provided  Performed by:  Phylliss Strege Ragsdale, RN

## 2024-08-10 NOTE — Telephone Encounter (Signed)
 Can you provide copay amount? She is commercial so using copay card I presume? I'm happy to communicate with the patient Thank you!

## 2024-08-11 NOTE — Telephone Encounter (Signed)
 Hello, I do not know copay amount until her claim has been ran. We got the copay card information yesterday from the patient. The front of the card says copays as little as $5.

## 2024-09-15 ENCOUNTER — Encounter (INDEPENDENT_AMBULATORY_CARE_PROVIDER_SITE_OTHER): Payer: Self-pay | Admitting: Gastroenterology

## 2024-10-19 ENCOUNTER — Telehealth: Payer: Self-pay

## 2024-10-19 NOTE — Telephone Encounter (Signed)
 Patient is receiving CoPay Assistance Medication: Leqvio 

## 2024-10-25 ENCOUNTER — Telehealth (INDEPENDENT_AMBULATORY_CARE_PROVIDER_SITE_OTHER): Payer: Self-pay | Admitting: Gastroenterology

## 2024-10-25 NOTE — Telephone Encounter (Signed)
 I called the patient today and she says she went to the bathroom this morning and had some darker red blood in stools. Patient has a history of hemorrhoids. But does not feel or see any current hemorrhoids or fissures.  She reports she has had some issues with constipation. Report she has some issues with mild nausea, and mild cramping in her lower abdomen. She has strained to get her stools to come out. Denies any fevers, but has seen mucus in stools in the past few days. She reports she takes OTC Dulcolax two times a week, can not take Miralax as this causes her tongue to swell. She is currently taking Wegovy  1 mg once a week. She has taken Metamucil three times in the last week, and takes Linzess  290 mcg daily, which she says she has been taking daily for the last month. Patient last seen by [facility] 06/12/2023 and next appointment with Mitzie is scheduled for 11/23/2024. [facility] has patient on a cancellation list).Please advise.

## 2024-10-25 NOTE — Telephone Encounter (Signed)
 I spoke with the patient and made her aware per Mitzie Boettcher, Np, If she is having abdominal pain, rectal bleeding and mucus in stools, as we do not have any urgent slots, I would recommend she either try to see PCP or go to the ER for further evaluation as this may warrant labs/imaging to rule out other causes such as diverticulitis.  Patient states understanding.

## 2024-10-25 NOTE — Telephone Encounter (Signed)
 Pt moved her OV on 12/27/2024 to 11/23/2024 due to passing blood in stools. I have her on a wait list if we have cancellations before then. This was orginially scheduled as a one year follow up. I told her if she continues passing a lot of blood to go to the ER. [phone]

## 2024-10-26 ENCOUNTER — Encounter: Payer: Self-pay | Admitting: Nurse Practitioner

## 2024-10-26 ENCOUNTER — Ambulatory Visit: Admitting: Nurse Practitioner

## 2024-10-26 VITALS — BP 121/81 | HR 77 | Ht 59.0 in | Wt 175.0 lb

## 2024-10-26 DIAGNOSIS — K921 Melena: Secondary | ICD-10-CM | POA: Diagnosis not present

## 2024-10-26 DIAGNOSIS — K5909 Other constipation: Secondary | ICD-10-CM

## 2024-10-26 DIAGNOSIS — R519 Headache, unspecified: Secondary | ICD-10-CM | POA: Diagnosis not present

## 2024-10-26 MED ORDER — HYDROCORTISONE ACE-PRAMOXINE 1-1 % EX CREA: 1.0000 | TOPICAL_CREAM | Freq: Two times a day (BID) | CUTANEOUS | 0 refills | Status: DC

## 2024-10-26 MED ORDER — FLUTICASONE PROPIONATE 50 MCG/ACT NA SUSP: 1.0000 | Freq: Every day | NASAL | 2 refills | Status: AC

## 2024-10-26 NOTE — Progress Notes (Signed)
 Acute Office Visit  Subjective:     Patient ID: Marrissa Dai, female    DOB: 09-16-1971, 53 y.o.   MRN: [id number]  Chief Complaint  Patient presents with   Blood In Stools    Blood in stool yesterday AM, bright red with mild cramping   Facial Pain    Right sided facial pain     HPI Patient is in today due to having blood in her stool yesterday morning. This only occurred one time. Described the blood as bright red, and said that it was in her stool as well as on the toilet paper after wiping. Said that the stool was hard, and reports that her anus felt raw after passing the stool. Had a normal stool 4-5 hours later with no blood. Reports that she has issues with constipation. Has diarrhea leaking around stool at times. Currently takes Wegovy  and that makes her constipation worse. Currently takes dulcolax and linzess  with no improvement in symptoms. Reports abdominal cramping pain since taking dulcolax. Has tried metamucil, fleets enema, and magnesium citrate with no improvement in constipation. Is allergic to Mirlax. Has been seen by GI specialist. Reports nausea at times. Has a history of hemorrhoids, but denies any current external hemorrhoids. Has a family history of colon cancer. Denies SOB, fatigue, or weakness.   Also reports right-sided facial soreness. Has occurred for a week now. Denies any ear pain, sinus congestion, fever, cough, runny nose. Reports sinus drainage in the back of her throat at times. Denies bruxism, or history of TMJ soreness.   Review of Systems  Constitutional:  Negative for fever and malaise/fatigue.  HENT:  Negative for congestion, ear pain, sinus pain and sore throat.   Respiratory:  Negative for cough, shortness of breath and wheezing.   Cardiovascular:  Negative for chest pain.  Gastrointestinal:  Positive for abdominal pain, blood in stool, constipation and nausea. Negative for diarrhea, melena and vomiting.  Neurological:  Negative for weakness.       Objective:    Today's Vitals   10/26/24 0852  BP: 121/81  Pulse: 77  SpO2: 97%  Weight: 175 lb (79.4 kg)  Height: 4' 11 (1.499 m)   Body mass index is 35.35 kg/m.   Physical Exam Vitals and nursing note reviewed.  Constitutional:      General: She is not in acute distress.    Appearance: Normal appearance. She is not ill-appearing.  HENT:     Head:     Jaw: No tenderness or pain on movement.     Comments: Mild tenderness with palpation of right maxillary sinus.    Right Ear: Hearing, ear canal and external ear normal. Tympanic membrane is retracted.     Left Ear: Hearing, ear canal and external ear normal. Tympanic membrane is retracted.     Nose: No congestion or rhinorrhea.     Mouth/Throat:     Mouth: Mucous membranes are moist.     Pharynx: No posterior oropharyngeal erythema.  Cardiovascular:     Rate and Rhythm: Normal rate and regular rhythm.     Heart sounds: Normal heart sounds, S1 normal and S2 normal. No murmur heard. Pulmonary:     Effort: Pulmonary effort is normal. No respiratory distress.     Breath sounds: Normal breath sounds. No wheezing.  Abdominal:     Tenderness: There is abdominal tenderness. There is no guarding or rebound.     Comments: No obvious masses or abnormalities palpated on exam. Mild, diffuse abdominal tenderness  noted in the epigastric area, LLQ and RLQ areas of the abdomen.   Genitourinary:    Comments: Patient deferred rectal exam at this time.  Lymphadenopathy:     Cervical: No cervical adenopathy.  Neurological:     Mental Status: She is alert.  Psychiatric:        Mood and Affect: Mood normal.        Behavior: Behavior normal.        Thought Content: Thought content normal.        Judgment: Judgment normal.       Assessment & Plan:  1. Hematochezia (Primary) -Discussed treatment options with patient. Due to the patient only having one bloody stool and a history of constipation, I suspect that the patient has an internal  hemorrhoid. No symptoms of anemia, and no lab work recommended at this time. Patient to follow up with GI in December.  - pramoxine-hydrocortisone  (PROCTOCREAM-HC) 1-1 % rectal cream; Place 1 Application rectally 2 (two) times daily. PRN hemorrhoids  Dispense: 30 g; Refill: 0  2. Chronic constipation -Recommended stopping the dulcolax due to abdominal cramping. Patient has tried multiple medications for constipation with no success. Recommended OTC colace to soften hard stool, and provided the name on check-out sheet. Patient is to follow up with GI on 11/23/24.   3. Right facial pain -Due to sinus tenderness and post nasal sinus drainage, discussed with patient that this is most likely due to weather changes. Advised patient to use flonase  for 7 days for it to start working. Instructed patient how to use nasal spray.  - fluticasone  (FLONASE ) 50 MCG/ACT nasal spray; Place 1 spray into both nostrils daily.  Dispense: 9.9 mL; Refill: 2   Warning signs reviewed regarding her problems today. Recheck is worsens or persists.  Return if symptoms worsen or fail to improve.   Damien KATHEE Pringle, FNP

## 2024-10-26 NOTE — Patient Instructions (Signed)
 -Colace, high magnesium foods  Fiber Content in Foods Fiber is found in plant foods, such as fruits, vegetables, whole grains, nuts, seeds, and beans. If you have certain conditions, you may need to eat a high-fiber diet or a low-fiber diet. Your health care provider will tell you how much fiber you need. If you have problems or questions, contact your provider or dietitian. What foods are high in fiber?  Foods high in fiber have 4g of fiber or more per serving. Fruits Blackberries or raspberries (fresh) --  cup (75 g) has 4 g of fiber. Pear (fresh) -- 1 medium (180 g) has 5.5 g of fiber. Prunes (dried) -- 6 to 8 pieces (57-76 g) has 5 g of fiber. Apple with skin -- 1 medium (182 g) has 4.8 g of fiber. Guava -- 1 cup (128 g) has 8.9 g of fiber. Vegetables Peas (frozen) --  cup (80 g) has 4.4 g of fiber. Potato with skin (baked) -- 1 medium (173 g) has 4 g of fiber. Pumpkin (canned) --  cup (122 g) has 4 g of fiber. Sweet potato --  cup mashed (124 g) has 4 g of fiber. Winter squash -- 1 cup cooked (205 g) has 5.7 g of fiber. Grains Bran cereal --  cup (31 g) has 8.6 g of fiber. Bulgur (cooked) --  cup (70 g) has 4 g of fiber. Quinoa (cooked) -- 1 cup (185 g) has 5.2 g of fiber. Popcorn -- 3 cups (375 g) popped has 5.8 g of fiber. Spaghetti, whole wheat -- 1 cup (140 g) has 6 g of fiber. Oatmeal (cooked) -- 1 cup (234 g) has 4g of fiber. Meats and other proteins Pinto beans (cooked) --  cup (90 g) has 7.7 g of fiber. Lentils (cooked) --  cup (90 g) has 7.8 g of fiber. Kidney beans (canned) --  cup (92.5 g) has 5.7 g of fiber. Soybeans (canned, frozen, or fresh) --  cup (92.5 g) has 5.2 g of fiber. Baked beans, plain or vegetarian (canned) --  cup (130 g) has 5.2 g of fiber. Garbanzo beans or chickpeas (canned) --  cup (90 g) has 6.6 g of fiber. Black beans (cooked) --  cup (86 g) has 7.5 g of fiber. White beans or navy beans (cooked) --  cup (91 g) has 9.3 g of  fiber. The items listed above may not be a complete list of foods with high fiber. Actual amounts of fiber may be different depending on processing. Contact a dietitian for more information. What foods are moderate in fiber?  Moderate fiber foods have 1-3 g of fiber per serving. Fruits Banana -- 1 medium (126 g) has 3.2 g of fiber. Melon -- 1 cup (155 g) has 1.4 g of fiber. Orange -- 1 small (154 g) has 3.7 g of fiber. Raisins --  cup (40 g) has 1.8 g of fiber. Applesauce, sweetened --  cup (125 g) has 1.5 g of fiber. Blueberries (fresh) --  cup (75 g) has 1.8 g of fiber. Strawberries (fresh, sliced) -- 1 cup (150 g) has 3 g of fiber. Cherries -- 1 cup (140 g) has 2.9 g of fiber. Vegetables Broccoli (cooked) --  cup (77.5 g) has 2.1 g of fiber. Brussels sprouts (cooked) --  cup (78 g) has 3 g of fiber. Carrots (cooked) --  cup (77.5 g) has 2.2 g of fiber. Corn (canned or frozen) --  cup (82.5 g) has 2.1 g of fiber. Potatoes, mashed --  cup (105 g) has 1.6 g of fiber. Tomato -- 1 medium (62 g) has 1.5 g of fiber. Green beans (canned) --  cup (83 g) has 2 g of fiber. Sweet potato, baked -- 1 medium (150 g) has 3 g of fiber. Cauliflower (cooked) -- 1/2 cup (90 g) has 2.3 g of fiber. Grains Long-grain brown rice (cooked) -- 1 cup (196 g) has 3.5 g of fiber. Bagel, plain -- one 4-inch (10 cm) bagel has 2 g of fiber. Instant oatmeal --  cup (120 g) has about 2 g of fiber. Macaroni noodles, enriched (cooked) -- 1 cup (140 g) has 2.5 g of fiber. Multigrain cereal --  cup (15 g) has about 2-4 g of fiber. Whole-wheat bread -- 1 slice (26 g) has 2 g of fiber. Whole-wheat spaghetti noodles --  cup (70 g) has 3.2 g of fiber. Corn tortilla -- one 6-inch (15 cm) tortilla has 1.5 g of fiber. Meats and other proteins Almonds --  cup or 1 oz (28 g) has 3.5 g of fiber. Sunflower seeds in shell --  cup or  oz (11.5 g) has 1.1 g of fiber. Vegetable or soy patty -- 1 patty (70 g) has 3.4  g of fiber. Walnuts --  cup or 1 oz (30 g) has 2 g of fiber. Flax seed -- 1 Tbsp (7 g) has 2.8 g of fiber. The items listed above may not be a complete list of foods with moderate amounts of fiber. Actual amounts of fiber may be different depending on processing. Contact a dietitian for more information. What foods are low in fiber?  Low-fiber foods contain less than 1 g of fiber per serving. They include: Fruits Fruit juice --  cup or 4 fl oz (118 mL) has 0.5 g of fiber. Vegetables Lettuce -- 1 cup (35 g) has 0.5 g of fiber. Cucumber (slices) --  cup (60 g) has 0.3 g of fiber. Celery -- 1 stalk (40 g) has 0.1 g of fiber. Grains Flour tortilla -- one 6-inch (15 cm) tortilla has 0.5 g of fiber. White rice (cooked) --  cup (81.5 g) has 0.3 g of fiber. Meats and other proteins Egg -- 1 large (50 g) has 0 g of fiber. Meat, poultry, or fish -- 3 oz (85 g) has 0 g of fiber. Dairy Milk -- 1 cup or 8 fl oz (237 mL) has 0 g of fiber. Yogurt -- 1 cup (245 g) has 0 g of fiber. The items listed above may not be a complete list of foods that are low in fiber. Actual amounts of fiber may be different depending on processing. Contact a dietitian for more information. This information is not intended to replace advice given to you by your health care provider. Make sure you discuss any questions you have with your health care provider. Document Revised: 02/10/2023 Document Reviewed: 02/10/2023 Elsevier Patient Education  2024 [employer].

## 2024-10-29 ENCOUNTER — Other Ambulatory Visit: Payer: Self-pay | Admitting: Family Medicine

## 2024-10-31 ENCOUNTER — Encounter: Payer: Self-pay | Admitting: Nurse Practitioner

## 2024-11-04 ENCOUNTER — Encounter: Payer: Self-pay | Admitting: Family Medicine

## 2024-11-07 ENCOUNTER — Other Ambulatory Visit: Payer: Self-pay | Admitting: Family Medicine

## 2024-11-07 MED ORDER — WEGOVY 1.7 MG/0.75ML ~~LOC~~ SOAJ: SUBCUTANEOUS | 3 refills | Status: AC

## 2024-11-09 ENCOUNTER — Encounter: Attending: Family Medicine | Admitting: Emergency Medicine

## 2024-11-09 VITALS — BP 121/81 | HR 90 | Temp 98.8°F | Resp 16

## 2024-11-09 DIAGNOSIS — E78019 Familial hypercholesterolemia, unspecified: Secondary | ICD-10-CM | POA: Insufficient documentation

## 2024-11-09 MED ORDER — INCLISIRAN SODIUM 284 MG/1.5ML ~~LOC~~ SOSY
284.0000 mg | PREFILLED_SYRINGE | Freq: Once | SUBCUTANEOUS | Status: AC
  Administered 2024-11-09: 284 mg via SUBCUTANEOUS

## 2024-11-09 NOTE — Progress Notes (Signed)
 Diagnosis: Hyperlipidemia  Provider:  Alphonsa Hamilton MD  Procedure: Injection  Leqvio  (inclisiran), Dose: 284 mg, Site: subcutaneous, Number of injections: 1  Injection Site(s): Right arm  Post Care: Patient declined observation  Discharge: Condition: Good, Destination: Home . AVS Declined  Performed by:  Delon ONEIDA Officer, RN

## 2024-11-23 ENCOUNTER — Ambulatory Visit (INDEPENDENT_AMBULATORY_CARE_PROVIDER_SITE_OTHER): Admitting: Gastroenterology

## 2024-11-23 ENCOUNTER — Encounter: Payer: Self-pay | Admitting: Family Medicine

## 2024-11-23 ENCOUNTER — Encounter (INDEPENDENT_AMBULATORY_CARE_PROVIDER_SITE_OTHER): Payer: Self-pay | Admitting: Gastroenterology

## 2024-11-23 VITALS — BP 126/82 | HR 76 | Temp 97.8°F | Ht 59.5 in | Wt 176.2 lb

## 2024-11-23 DIAGNOSIS — K6289 Other specified diseases of anus and rectum: Secondary | ICD-10-CM | POA: Insufficient documentation

## 2024-11-23 DIAGNOSIS — K5900 Constipation, unspecified: Secondary | ICD-10-CM | POA: Diagnosis not present

## 2024-11-23 DIAGNOSIS — K625 Hemorrhage of anus and rectum: Secondary | ICD-10-CM | POA: Insufficient documentation

## 2024-11-23 DIAGNOSIS — K5909 Other constipation: Secondary | ICD-10-CM

## 2024-11-23 MED ORDER — LUBIPROSTONE 24 MCG PO CAPS: 24.0000 ug | ORAL_CAPSULE | Freq: Two times a day (BID) | ORAL | 3 refills | Status: AC

## 2024-11-23 NOTE — Progress Notes (Addendum)
 "  Referring Provider: Alphonsa Glendia LABOR, MD Primary Care Physician:  Alphonsa Glendia LABOR, MD Primary GI Physician: Dr. Eartha   Chief Complaint  Patient presents with   Follow-up    Patient here today due to seeing bright red blood in stools and when wiping. She only saw this on one instants a few weeks ago, and it has not seen any since. Patient is having issues with constipation, reports she is on wegovy  weekly. She is taking Linzess  290 mcg daily.    HPI:   Brittney [name] is a 53 y.o. female with past medical history of GERD, DM, HTN, IBS    Patient presenting today for:  Follow up of constipation  and rectal bleeding   Last seen July 2024, at that time on semiglutide with worsening constipation, had to double up linzess . Recently taken off semiglutide and recently ran out of linzess . Having occasional rectal bleeding and chronic nausea.   Recommended to increase linzess  to 290mcg-samples given, increase water  intake, otc hemorrhoid wipes/cream, avoid straining, limit toilet time  CBC in June WNL with hgb 12.5 HFP in July WNL  Present:  Still having issues with constipation, she did skip this weekend as she was cooking for church. She feels even when she takes linzess  regularly, it does not work that well. She is taking wegovy , has been on this for about 4 months. Having on average  BM about 2-3 times per week. Taking dulcolax which makes her have diarrhea. Stools otherwise are very hard and she has to strain a lot to defecate. She does note she saw some BRB in the toilet a few weeks ago and on toilet paper. She has had some bleeding in the past when she felt she had hemorrhoids but denies any sensation of hemorrhoids this most recent episode. She reports some pain, tenderness around anal area, she does note she has to wipe a lot to get herself clean. Trying to do on average about 5 bottles of water  per day but recently doing less than this.    Last colonoscopy 10/31/20: - Three small  polyps at the hepatic flexure, removed with a cold snare. Resected and retrieved. - Two small polyps at the hepatic flexure. Biopsied.One is hyperplastic polyp.  2 polyps are tubular adenomas and 2 polyps are sessile serrated polyps. - Anal papilla(e) were hypertrophied. - Small lipoma at the anus.   Recommendations:  Repeat colonoscopy in nov 2026  Filed Weights   11/23/24 0818  Weight: 176 lb 3.2 oz (79.9 kg)     Past Medical History:  Diagnosis Date   Acid reflux    Chest pain    intermittent x 1 yr   Diabetic mellitus, gestational    Hypertension    during pregnancy. Not treated for it now.   IBS (irritable bowel syndrome)    dx in 2005 by primary MD   IBS (irritable bowel syndrome)    Obesity    Seasonal allergies    yr roud; was immunothrapy in childhood, does not need any more at this time   Sjogren's syndrome     Past Surgical History:  Procedure Laterality Date   CESAREAN SECTION W/BTL  2007   cholectystectomy  2000   CHOLECYSTECTOMY  2000   COLONOSCOPY N/A 02/19/2013   Procedure: COLONOSCOPY;  Surgeon: Claudis RAYMOND Rivet, MD;  [facility];  Service: Endoscopy;  Laterality: N/A;  830   COLONOSCOPY WITH PROPOFOL  N/A 10/31/2020   Procedure: COLONOSCOPY WITH PROPOFOL ;  Surgeon: Rivet,  Claudis PENNER, MD;  Location: [facility];  Service: Gastroenterology;  Laterality: N/A;  1130   POLYPECTOMY  10/31/2020   Procedure: POLYPECTOMY;  Surgeon: Golda Claudis PENNER, MD;  Location: [facility];  Service: Gastroenterology;;    Current Outpatient Medications  Medication Sig Dispense Refill   CHLOROPHYLL PO Take by mouth daily at 6 (six) AM.     fluticasone  (FLONASE ) 50 MCG/ACT nasal spray Place 1 spray into both nostrils daily. (Patient taking differently: Place 1 spray into both nostrils as needed.) 9.9 mL 2   linaclotide  (LINZESS ) 290 MCG CAPS capsule Take 1 capsule (290 mcg total) by mouth daily before breakfast. 30 capsule 5   losartan  (COZAAR ) 100 MG tablet TAKE  1 TABLET BY MOUTH EVERY DAY 90 tablet 2   MAGNESIUM PO Take 400 mg by mouth daily.      meclizine  (ANTIVERT ) 25 MG tablet Take 1 tablet (25 mg total) by mouth 3 (three) times daily as needed for dizziness or nausea. 30 tablet 1   Multiple Vitamins-Minerals (MULTIVITAMIN PO) Take 1 tablet by mouth daily. Alive for Woman 50 +     ondansetron  (ZOFRAN ) 8 MG tablet 1 tablet taken 3 times daily as needed 18 tablet 2   pilocarpine  (SALAGEN ) 5 MG tablet Take 5 mg by mouth as needed.     semaglutide -weight management (WEGOVY ) 1.7 MG/0.75ML SOAJ SQ injection Once per week as directed 3 mL 3   TURMERIC PO Take 375 mg by mouth daily.     Cholecalciferol (VITAMIN D ) 125 MCG (5000 UT) CAPS Take 5,000 Units by mouth daily. (Patient not taking: Reported on 11/23/2024)     Omega-3 Fatty Acids (FISH OIL PO) Take 400 mg by mouth daily. (Patient not taking: Reported on 11/23/2024)     pramoxine-hydrocortisone  (PROCTOCREAM-HC) 1-1 % rectal cream Place 1 Application rectally 2 (two) times daily. PRN hemorrhoids (Patient not taking: Reported on 11/23/2024) 30 g 0   No current facility-administered medications for this visit.    Allergies as of 11/23/2024 - Review Complete 11/23/2024  Allergen Reaction Noted   Miralax [polyethylene glycol (macrogol)] Swelling 02/06/2013   Vasotec  [enalapril ]  05/05/2015   Crestor  [rosuvastatin ]  03/31/2024   Repatha  [evolocumab ]  03/31/2024    Social History   Socioeconomic History   Marital status: Married    Spouse name: Not on file   Number of children: 2   Years of education: Not on file   Highest education level: Some college, no degree  Occupational History   Occupation: patient advocate    Employer: [employer]  Tobacco Use   Smoking status: Never    Passive exposure: Current (very minimal)   Smokeless tobacco: Never  Vaping Use   Vaping status: Never Used  Substance and Sexual Activity   Alcohol use: Yes    Comment: occassionally    Drug use: Never    Sexual activity: Not on file  Other Topics Concern   Not on file  Social History Narrative   Married 14 years, 2 children ages 56 and 43.    Job: Pt accounting    Gets regular exercise.    Right Handed   Drinks Caffeine   Lives in a one story home    Social Drivers of Health   Tobacco Use: Medium Risk (11/23/2024)   Patient History    Smoking Tobacco Use: Never    Smokeless Tobacco Use: Never    Passive Exposure: Current  Financial Resource Strain: Patient Declined (10/25/2024)   Overall Financial  Resource Strain (CARDIA)    Difficulty of Paying Living Expenses: Patient declined  Food Insecurity: Patient Declined (10/25/2024)   Epic    Worried About Programme Researcher, Broadcasting/film/video in the Last Year: Patient declined    Barista in the Last Year: Patient declined  Transportation Needs: Patient Declined (10/25/2024)   Epic    Lack of Transportation (Medical): Patient declined    Lack of Transportation (Non-Medical): Patient declined  Physical Activity: Insufficiently Active (10/25/2024)   Exercise Vital Sign    Days of Exercise per Week: 3 days    Minutes of Exercise per Session: 30 min  Stress: Stress Concern Present (10/25/2024)   [employer] of Occupational Health - Occupational Stress Questionnaire    Feeling of Stress: Rather much  Social Connections: Unknown (10/25/2024)   Social Connection and Isolation Panel    Frequency of Communication with Friends and Family: Patient declined    Frequency of Social Gatherings with Friends and Family: Patient declined    Attends Religious Services: Patient declined    Active Member of Clubs or Organizations: Yes    Attends Banker Meetings: Patient declined    Marital Status: Patient declined  Depression (PHQ2-9): Low Risk (10/26/2024)   Depression (PHQ2-9)    PHQ-2 Score: 0  Alcohol Screen: Low Risk (10/25/2024)   Alcohol Screen    Last Alcohol Screening Score (AUDIT): 1  Housing: Patient Declined (10/25/2024)    Epic    Unable to Pay for Housing in the Last Year: Patient declined    Number of Times Moved in the Last Year: Not on file    Homeless in the Last Year: Patient declined  Utilities: Not on file  Health Literacy: Not on file    Review of systems General: negative for malaise, night sweats, fever, chills, weight loss Neck: Negative for lumps, goiter, pain and significant neck swelling Resp: Negative for cough, wheezing, dyspnea at rest CV: Negative for chest pain, leg swelling, palpitations, orthopnea GI: denies melena, nausea, vomiting, diarrhea, dysphagia, odyonophagia, early satiety or unintentional weight loss. +constipation +rectal bleeding  MSK: Negative for joint pain or swelling, back pain, and muscle pain. Derm: Negative for itching or rash Psych: Denies depression, anxiety, memory loss, confusion. No homicidal or suicidal ideation.  Heme: Negative for prolonged bleeding, bruising easily, and swollen nodes. Endocrine: Negative for cold or heat intolerance, polyuria, polydipsia and goiter. Neuro: negative for tremor, gait imbalance, syncope and seizures. The remainder of the review of systems is noncontributory.  Physical Exam: BP 126/82 (BP Location: Left Arm, Patient Position: Sitting, Cuff Size: Large)   Pulse 76   Temp 97.8 F (36.6 C) (Temporal)   Ht 4' 11.5 (1.511 m)   Wt 176 lb 3.2 oz (79.9 kg)   LMP 12/18/2023   BMI 34.99 kg/m  General:   Alert and oriented. No distress noted. Pleasant and cooperative.  Head:  Normocephalic and atraumatic. Eyes:  Conjuctiva clear without scleral icterus. Mouth:  Oral mucosa pink and moist. Good dentition. No lesions. Heart: Normal rate and rhythm, s1 and s2 heart sounds present.  Lungs: Clear lung sounds in all lobes. Respirations equal and unlabored. Abdomen:  +BS, soft, non-tender and non-distended. No rebound or guarding. No HSM or masses noted. Rectal: crystal sutton CMA as witness, no obvious masses noted externally, some  small, superficial like tears/excoriation to the skin around anal opening, DRE with normal sphincter tone, no obvious internal masses or lesions noted on exam of distal rectum Derm: No palmar  erythema or jaundice Msk:  Symmetrical without gross deformities. Normal posture. Extremities:  Without edema. Neurologic:  Alert and  oriented x4 Psych:  Alert and cooperative. Normal mood and affect.  Invalid input(s): 6 MONTHS   ASSESSMENT: Rolinda Impson is a 53 y.o. female presenting today for follow up of constipation and rectal bleeding   Patient with ongoing constipation despite taking Linzess  290 mcg.  Of note she is on Wegovy  which is likely influencing her constipation.  She notes 1 episode of rectal bleeding about 2 to 3 weeks ago.  None since.  On rectal exam no obvious hemorrhoid or masses though she has some excoriation and superficial like tears around anal opening, possibly the source of bleeding and likely due to aggressive wiping/bleeding.  For now we will stop Linzess  and try Amitiza  24 mcg twice daily, encouraged her to increase water  intake, fruits veggies and whole grains in her diet.  She can try doing over-the-counter Desitin or A&E ointment for her anorectal area and should avoid aggressive wiping/cleaning and over use of wet wipes.  Her last colonoscopy was in 2021, she is due for her next surveillance in November 2026, however if she has recurrence of bleeding I would recommend pursuing earlier interval colonoscopy.  She should let me know if she has recurrence of rectal bleeding.   PLAN:  -stop linzess  -start amitiza  24mcg BID -Increase water  intake, aim for atleast 64 oz per day -Increase fruits, veggies and whole grains, kiwi and prunes are especially good for constipation -limit toilet time, avoid straining -will need to schedule sooner colonoscopy if rectal bleeding recurs -use desitin or A&D ointment on this area to act as a barrier, try to avoid aggressive wiping or use  of wet wipes, clean up with warm water  only  All questions were answered, patient verbalized understanding and is in agreement with plan as outlined above.   Follow Up: 3 months   Nhyira Leano L. Mariette, MSN, APRN, AGNP-C Adult-Gerontology Nurse Practitioner [facility]  I have reviewed the note and agree with the APP's assessment as described in this progress note  Toribio Fortune, MD Gastroenterology and Hepatology [facility] "

## 2024-11-23 NOTE — Patient Instructions (Addendum)
-  stop linzess  -start amitiza  24mcg BID -Increase water  intake, aim for atleast 64 oz per day -Increase fruits, veggies and whole grains, kiwi and prunes are especially good for constipation -avoid straining, limit toilet time -will need to schedule sooner colonoscopy if rectal bleeding recurs -as I saw some irritation in rectal area, you can use desitin or A&D ointment on this area to act as a barrier, try to avoid aggressive wiping or use of wet wipes, clean up with warm water  only  Follow up 3 months  It was a pleasure to see you today. I want to create trusting relationships with patients and provide genuine, compassionate, and quality care. I truly value your feedback! please be on the lookout for a survey regarding your visit with me today. I appreciate your input about our visit and your time in completing this!    Brittney Maceachern L. Annye Forrey, MSN, APRN, AGNP-C Adult-Gerontology Nurse Practitioner [facility] at [facility]

## 2024-11-30 ENCOUNTER — Telehealth: Payer: Self-pay | Admitting: Pharmacy Technician

## 2024-11-30 ENCOUNTER — Other Ambulatory Visit (HOSPITAL_COMMUNITY): Payer: Self-pay

## 2024-11-30 NOTE — Telephone Encounter (Signed)
 Pharmacy Patient Advocate Encounter   Received notification from Onbase that prior authorization for Zepbound  2.5MG /0.5ML pen-injectors is due for renewal.   Insurance verification completed.   The patient is insured through [facility].  Action: Medication has been discontinued. Archived Key: [id number]

## 2024-12-03 NOTE — Progress Notes (Signed)
 "  Acute Office Visit  Subjective:     Patient ID: Brittney [name], female    DOB: 29-Jul-1971, 54 y.o.   MRN: [id number]  Chief Complaint  Patient presents with   Blood In Stools    Blood in stool yesterday AM, bright red with mild cramping   Facial Pain    Right sided facial pain     HPI Patient is in today due to having blood in her stool yesterday morning. This only occurred one time. Described the blood as bright red, and said that it was in her stool as well as on the toilet paper after wiping. Said that the stool was hard, and reports that her anus felt raw after passing the stool. Had a normal stool 4-5 hours later with no blood. Reports that she has issues with constipation. Has diarrhea leaking around stool at times. Currently takes Wegovy  and that makes her constipation worse. Currently takes dulcolax and linzess  with no improvement in symptoms. Reports abdominal cramping pain since taking dulcolax. Has tried metamucil, fleets enema, and magnesium citrate with no improvement in constipation. Is allergic to Mirlax. Has been seen by GI specialist. Reports nausea at times. Has a history of hemorrhoids, but denies any current external hemorrhoids. Has a family history of colon cancer. Denies SOB, fatigue, or weakness.   Also reports right-sided facial soreness. Has occurred for a week now. Denies any ear pain, sinus congestion, fever, cough, runny nose. Reports sinus drainage in the back of her throat at times. Denies bruxism, or history of TMJ soreness.   Review of Systems  Constitutional:  Negative for fever and malaise/fatigue.  HENT:  Negative for congestion, ear pain, sinus pain and sore throat.   Respiratory:  Negative for cough, shortness of breath and wheezing.   Cardiovascular:  Negative for chest pain.  Gastrointestinal:  Positive for abdominal pain, blood in stool, constipation and nausea. Negative for diarrhea, melena and vomiting.  Neurological:  Negative for weakness.       Objective:    Today's Vitals   10/26/24 0852  BP: 121/81  Pulse: 77  SpO2: 97%  Weight: 175 lb (79.4 kg)  Height: 4' 11 (1.499 m)   Body mass index is 35.35 kg/m.   Physical Exam Vitals and nursing note reviewed.  Constitutional:      General: She is not in acute distress.    Appearance: Normal appearance. She is not ill-appearing.  HENT:     Head:     Jaw: No tenderness or pain on movement.     Comments: Mild tenderness with palpation of right maxillary sinus.    Right Ear: Hearing, ear canal and external ear normal. Tympanic membrane is retracted.     Left Ear: Hearing, ear canal and external ear normal. Tympanic membrane is retracted.     Nose: No congestion or rhinorrhea.     Mouth/Throat:     Mouth: Mucous membranes are moist.     Pharynx: No posterior oropharyngeal erythema.  Cardiovascular:     Rate and Rhythm: Normal rate and regular rhythm.     Heart sounds: Normal heart sounds, S1 normal and S2 normal. No murmur heard. Pulmonary:     Effort: Pulmonary effort is normal. No respiratory distress.     Breath sounds: Normal breath sounds. No wheezing.  Abdominal:     Tenderness: There is abdominal tenderness. There is no guarding or rebound.     Comments: No obvious masses or abnormalities palpated on exam. Mild, diffuse abdominal  tenderness noted in the epigastric area, LLQ and RLQ areas of the abdomen.   Genitourinary:    Comments: Patient deferred rectal exam at this time.  Lymphadenopathy:     Cervical: No cervical adenopathy.  Neurological:     Mental Status: She is alert.  Psychiatric:        Mood and Affect: Mood normal.        Behavior: Behavior normal.        Thought Content: Thought content normal.        Judgment: Judgment normal.       Assessment & Plan:  1. Hematochezia (Primary) -Discussed treatment options with patient. Due to the patient only having one bloody stool and a history of constipation, I suspect that the patient has an internal  hemorrhoid. No symptoms of anemia, and no lab work recommended at this time. Patient to follow up with GI in December.  - pramoxine-hydrocortisone  (PROCTOCREAM-HC) 1-1 % rectal cream; Place 1 Application rectally 2 (two) times daily. PRN hemorrhoids  Dispense: 30 g; Refill: 0  2. Chronic constipation -Recommended stopping the dulcolax due to abdominal cramping. Patient has tried multiple medications for constipation with no success. Recommended OTC colace to soften hard stool, and provided the name on check-out sheet. Patient is to follow up with GI on 11/23/24.   3. Right facial pain -Due to sinus tenderness and post nasal sinus drainage, discussed with patient that this is most likely due to weather changes. Advised patient to use flonase  for 7 days for it to start working. Instructed patient how to use nasal spray.  - fluticasone  (FLONASE ) 50 MCG/ACT nasal spray; Place 1 spray into both nostrils daily.  Dispense: 9.9 mL; Refill: 2   Warning signs reviewed regarding her problems today. Recheck is worsens or persists.  Return if symptoms worsen or fail to improve.   Damien KATHEE Pringle, FNP  Addendum:  Patient was seen at [facility] on 10/26/2024 under the supervision of [facility]. Originally entered in error.   Thanks, Damien Pringle, FNP-BC "

## 2024-12-07 ENCOUNTER — Telehealth: Payer: Self-pay | Admitting: Pharmacy Technician

## 2024-12-07 ENCOUNTER — Other Ambulatory Visit (HOSPITAL_COMMUNITY): Payer: Self-pay

## 2024-12-07 NOTE — Telephone Encounter (Signed)
 Pharmacy Patient Advocate Encounter  Received notification from [facility] that Prior Authorization for  Wegovy  1.7MG /0.75ML auto-injectors has been APPROVED from 12/07/2024 to 07/07/2025. Ran test claim, Copay is $0.00. This test claim was processed through [facility]- copay amounts may vary at other pharmacies due to pharmacy/plan contracts, or as the patient moves through the different stages of their insurance plan.   PA #/Case ID/Reference #: [id number]

## 2024-12-07 NOTE — Telephone Encounter (Signed)
 Pharmacy Patient Advocate Encounter   Received notification from Onbase CMM KEY that prior authorization for Wegovy  1.7MG /0.75ML auto-injectors is required/requested.   Insurance verification completed.   The patient is insured through [facility]   Per test claim: PA required; PA submitted to above mentioned insurance via Latent Key/confirmation #/EOC [id number] Status is pending

## 2024-12-27 ENCOUNTER — Ambulatory Visit (INDEPENDENT_AMBULATORY_CARE_PROVIDER_SITE_OTHER): Admitting: Gastroenterology

## 2025-01-17 ENCOUNTER — Ambulatory Visit: Payer: 59 | Admitting: Rheumatology

## 2025-01-17 ENCOUNTER — Ambulatory Visit: Payer: 59 | Admitting: Neurology

## 2025-01-17 DIAGNOSIS — M2242 Chondromalacia patellae, left knee: Secondary | ICD-10-CM

## 2025-01-17 DIAGNOSIS — M545 Low back pain, unspecified: Secondary | ICD-10-CM

## 2025-01-17 DIAGNOSIS — I10 Essential (primary) hypertension: Secondary | ICD-10-CM

## 2025-01-17 DIAGNOSIS — Z8719 Personal history of other diseases of the digestive system: Secondary | ICD-10-CM

## 2025-01-17 DIAGNOSIS — Z79899 Other long term (current) drug therapy: Secondary | ICD-10-CM

## 2025-01-17 DIAGNOSIS — M2550 Pain in unspecified joint: Secondary | ICD-10-CM

## 2025-01-17 DIAGNOSIS — M2241 Chondromalacia patellae, right knee: Secondary | ICD-10-CM

## 2025-01-17 DIAGNOSIS — G600 Hereditary motor and sensory neuropathy: Secondary | ICD-10-CM

## 2025-01-17 DIAGNOSIS — M3501 Sicca syndrome with keratoconjunctivitis: Secondary | ICD-10-CM

## 2025-01-17 DIAGNOSIS — M19041 Primary osteoarthritis, right hand: Secondary | ICD-10-CM

## 2025-05-10 ENCOUNTER — Ambulatory Visit
